# Patient Record
Sex: Female | Born: 1960 | Race: Black or African American | Hispanic: No | Marital: Single | Smoking: Never smoker
Health system: Southern US, Community
[De-identification: ages and names within clinical notes are randomized; demographics above are authoritative.]

## PROBLEM LIST (undated history)

## (undated) DIAGNOSIS — Z860100 Personal history of colon polyps, unspecified: Secondary | ICD-10-CM

## (undated) DIAGNOSIS — R030 Elevated blood-pressure reading, without diagnosis of hypertension: Secondary | ICD-10-CM

## (undated) DIAGNOSIS — T7840XA Allergy, unspecified, initial encounter: Secondary | ICD-10-CM

## (undated) DIAGNOSIS — R87629 Unspecified abnormal cytological findings in specimens from vagina: Secondary | ICD-10-CM

## (undated) HISTORY — PX: OTHER SURGICAL HISTORY: SHX169

## (undated) HISTORY — DX: Personal history of colon polyps, unspecified: Z86.0100

## (undated) HISTORY — DX: Allergy, unspecified, initial encounter: T78.40XA

## (undated) HISTORY — PX: BREAST BIOPSY: SHX20

## (undated) HISTORY — PX: BUNIONECTOMY: SHX129

## (undated) HISTORY — DX: Elevated blood-pressure reading, without diagnosis of hypertension: R03.0

## (undated) HISTORY — DX: Unspecified abnormal cytological findings in specimens from vagina: R87.629

## (undated) HISTORY — DX: Personal history of colonic polyps: Z86.010

---

## 1999-09-01 ENCOUNTER — Emergency Department (HOSPITAL_COMMUNITY): Admission: EM | Admit: 1999-09-01 | Discharge: 1999-09-01 | Payer: Self-pay | Admitting: Emergency Medicine

## 2002-05-20 ENCOUNTER — Other Ambulatory Visit: Admission: RE | Admit: 2002-05-20 | Discharge: 2002-05-20 | Payer: Self-pay | Admitting: Family Medicine

## 2002-06-02 ENCOUNTER — Encounter: Payer: Self-pay | Admitting: Family Medicine

## 2002-06-02 ENCOUNTER — Encounter: Admission: RE | Admit: 2002-06-02 | Discharge: 2002-06-02 | Payer: Self-pay | Admitting: Family Medicine

## 2005-01-25 ENCOUNTER — Other Ambulatory Visit: Admission: RE | Admit: 2005-01-25 | Discharge: 2005-01-25 | Payer: Self-pay | Admitting: Obstetrics and Gynecology

## 2007-06-04 ENCOUNTER — Encounter: Admission: RE | Admit: 2007-06-04 | Discharge: 2007-06-04 | Payer: Self-pay | Admitting: Obstetrics and Gynecology

## 2009-10-09 HISTORY — PX: COLONOSCOPY: SHX174

## 2009-11-17 ENCOUNTER — Encounter: Payer: Self-pay | Admitting: Gastroenterology

## 2009-11-18 ENCOUNTER — Encounter (INDEPENDENT_AMBULATORY_CARE_PROVIDER_SITE_OTHER): Payer: Self-pay | Admitting: *Deleted

## 2009-12-14 ENCOUNTER — Ambulatory Visit: Payer: Self-pay | Admitting: Gastroenterology

## 2009-12-14 DIAGNOSIS — K625 Hemorrhage of anus and rectum: Secondary | ICD-10-CM | POA: Insufficient documentation

## 2009-12-20 ENCOUNTER — Ambulatory Visit: Payer: Self-pay | Admitting: Gastroenterology

## 2010-01-10 ENCOUNTER — Encounter: Admission: RE | Admit: 2010-01-10 | Discharge: 2010-01-10 | Payer: Self-pay | Admitting: Obstetrics and Gynecology

## 2010-11-08 NOTE — Letter (Signed)
Summary: New Patient letter  [facility]  [address] [place], [place] [address]   Phone: [phone]  Fax: [phone]       11/18/2009 MRN: [id number]  Joanne Kim [address] [place], [place]  [address]  Dear Joanne Kim,  Welcome to the Gastroenterology Division at [facility].    You are scheduled to see Dr.  Melvia Heaps on December 14, 2009 at 11:15am  on the 3rd floor at [facility], [address]  We ask that you try to arrive at our office 15 minutes prior to your appointment time to allow for check-in.  We would like you to complete the enclosed self-administered evaluation form prior to your visit and bring it with you on the day of your appointment.  We will review it with you.  Also, please bring a complete list of all your medications or, if you prefer, bring the medication bottles and we will list them.  Please bring your insurance card so that we may make a copy of it.  If your insurance requires a referral to see a specialist, please bring your referral form from your primary care physician.  Co-payments are due at the time of your visit and may be paid by cash, check or credit card.     Your office visit will consist of a consult with your physician (includes a physical exam), any laboratory testing he/she may order, scheduling of any necessary diagnostic testing (e.g. x-ray, ultrasound, CT-scan), and scheduling of a procedure (e.g. Endoscopy, Colonoscopy) if required.  Please allow enough time on your schedule to allow for any/all of these possibilities.    If you cannot keep your appointment, please call [phone] to cancel or reschedule prior to your appointment date.  This allows [place] the opportunity to schedule an appointment for another patient in need of care.  If you do not cancel or reschedule by 5 p.m. the business day prior to your appointment date, you will be charged a $50.00 late cancellation/no-show fee.    Thank you  for choosing [facility] for your medical needs.  We appreciate the opportunity to care for you.  Please visit [place] at our website  to learn more about our practice.                     Sincerely,                                                             The Gastroenterology Division

## 2010-11-08 NOTE — Letter (Signed)
Summary: Results Letter  [facility]  [address] [place], [place] [address]   Phone: [phone]  Fax: [phone]        December 14, 2009 MRN: [id number]    Joanne Kim [address] [place], [place]  [address]    Dear Ms. HELLMANN,  It is my pleasure to have treated you recently as a new patient in my office. I appreciate your confidence and the opportunity to participate in your care.  Since I do have a busy inpatient endoscopy schedule and office schedule, my office hours vary weekly. I am, however, available for emergency calls everyday through my office. If I am not available for an urgent office appointment, another one of our gastroenterologist will be able to assist you.  My well-trained staff are prepared to help you at all times. For emergencies after office hours, a physician from our Gastroenterology section is always available through my 24 hour answering service  Once again I welcome you as a new patient and I look forward to a happy and healthy relationship             Sincerely,  Louis Meckel MD  This letter has been electronically signed by your physician.  Appended Document: Results Letter letter mailed

## 2010-11-08 NOTE — Assessment & Plan Note (Signed)
Summary: rectal bleeding, hemorroids, + hem stool.Marland Kitchenem   History of Present Illness Visit Type: Initial Consult Primary GI MD: Melvia Heaps MD [facility] Primary Provider: Rudi Heap, MD Requesting Provider: Aleatha Borer, MD Chief Complaint: Patient referred for rectal bleeding, hemo positive stools, and hemorrhoids. Patient complains of some belching and bloating she also complains of some constipation.   History of Present Illness:   Joanne Kim is a pleasant 50 year old Afro-American female referred at the request of Dr. Aleatha Borer for evaluation of rectal bleeding.  On multiple occasions she has noted bright red blood per rectum consisting of bright red blood that sometimes would precede a bowel movement.  She denies rectal or abdominal pain.  She has noted protrusion of hemorrhoids with a bowel movement.  She suffers from mild chronic constipation.   GI Review of Systems    Reports belching and  bloating.      Denies abdominal pain, acid reflux, chest pain, dysphagia with liquids, dysphagia with solids, heartburn, loss of appetite, nausea, vomiting, vomiting blood, weight loss, and  weight gain.      Reports constipation, heme positive stool, hemorrhoids, and  rectal bleeding.     Denies anal fissure, black tarry stools, change in bowel habit, diarrhea, diverticulosis, fecal incontinence, irritable bowel syndrome, jaundice, light color stool, liver problems, and  rectal pain. Preventive Screening-Counseling & Management  Alcohol-Tobacco     Smoking Status: quit      Drug Use:  no.      Current Medications (verified): 1)  Fish Oil 1000 Mg Caps (Omega-3 Fatty Acids) .... Take One By Mouth Once Daily 2)  Vitamin D3 1000 Unit Caps (Cholecalciferol) .... 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  Alcohol Use - no Daily Caffeine Use 2 per day Illicit Drug Use - no Smoking Status:  quit Drug Use:  no  Review of Systems       The patient complains of allergy/sinus, back pain, fatigue, shortness of breath, and swelling of feet/legs.  The patient denies anemia, anxiety-new, arthritis/joint pain, blood in urine, breast changes/lumps, change in vision, confusion, cough, coughing up blood, depression-new, fainting, fever, headaches-new, hearing problems, heart murmur, heart rhythm changes, itching, menstrual pain, muscle pains/cramps, night sweats, nosebleeds, pregnancy symptoms, skin rash, sleeping problems, sore throat, swollen lymph glands, thirst - excessive , urination - excessive , urination changes/pain, urine leakage, vision changes, and voice change.         All other systems were reviewed and were negative   Vital Signs:  Patient profile:   50 year old female Height:      69 inches Weight:      237.6 pounds BMI:     35.21 Pulse rate:   74 / minute Pulse rhythm:   regular BP sitting:   118 / 76  (left arm) Cuff size:   regular  Vitals Entered By: Joanne Kim CMA Duncan Dull) (December 14, 2009 11:08 AM)  Physical Exam  Additional Exam:  She is an obese female  skin: anicteric HEENT: normocephalic; PEERLA; no nasal or pharyngeal abnormalities neck: supple nodes: no cervical lymphadenopathy chest: clear to ausculatation and percussion heart: no murmurs, gallops, or rubs abd: soft, nontender; BS normoactive; no  abdominal masses, tenderness, organomegaly rectal: no masses ext: no cynanosis, clubbing, edema skeletal: no deformities neuro: oriented x 3; no focal abnormalities    Impression & Recommendations:  Problem # 1:  RECTAL BLEEDING (ICD-569.3) Assessment New  Bleeding could be due to hemorrhoids.  A more proximal  colonic bleeding source should be ruled out.  Recommendations #1 colonoscopy #2 medical therapy for hemorrhoids if this is her bleeding source  Risks, alternatives, and complications of the procedure, including bleeding, perforation, and possible need for surgery, were explained to the patient.  Patient's questions were answered.  Orders: Colonoscopy (Colon)  Patient Instructions: 1)  Colonoscopy and Flexible Sigmoidoscopy brochure given.  2)  Conscious Sedation brochure given.  3)  Hemorrhoids brochure given.  4)  Your Colonoscopy is schedulded for 12/20/2009 at 8am 4 th floor of the [facility] 5)  You can pick up your MoviPrep from your pharmacy today 6)  CC Dr. Aleatha Borer 7)  CC Dr. Vernon Prey 8)  The medication list was reviewed and reconciled.  All changed / newly prescribed medications were explained.  A complete medication list was provided to the patient / caregiver. Prescriptions: MOVIPREP 100 GM  SOLR (PEG-KCL-NACL-NASULF-NA ASC-C) As per prep instructions.  #1 x 0   Entered by:   Joanne Kim CMA (AAMA)   Authorized by:   Louis Meckel MD   Signed by:   Joanne Kim CMA (AAMA) on 12/14/2009   Method used:   Electronically to        [facility] # [phone]* (retail)       [address] [place], [place]  [address]       Ph: [phone]       Fax: [phone]   RxID:   [id number]

## 2010-11-08 NOTE — Letter (Signed)
Summary: [facility] Instructions  [facility]  [address] [place], [place] [address]   Phone: [phone]  Fax: [phone]       Joanne Kim    [birth year]    MRN: [id number]        Procedure Day /Date:MONDAY 12/20/2009     Arrival Time:7:30AM     Procedure Time:8:00AM     Location of Procedure:                    X   [facility] (4th Floor)                        PREPARATION FOR COLONOSCOPY WITH MOVIPREP   Starting 5 days prior to your procedure 12/15/2009 do not eat nuts, seeds, popcorn, corn, beans, peas,  salads, or any raw vegetables.  Do not take any fiber supplements (e.g. Metamucil, Citrucel, and Benefiber).  THE DAY BEFORE YOUR PROCEDURE         DATE:12/19/2009  DAY: SUNDAY  1.  Drink clear liquids the entire day-NO SOLID FOOD  2.  Do not drink anything colored red or purple.  Avoid juices with pulp.  No orange juice.  3.  Drink at least 64 oz. (8 glasses) of fluid/clear liquids during the day to prevent dehydration and help the prep work efficiently.  CLEAR LIQUIDS INCLUDE: Water Jello Ice Popsicles Tea (sugar ok, no milk/cream) Powdered fruit flavored drinks Coffee (sugar ok, no milk/cream) Gatorade Juice: apple, white grape, white cranberry  Lemonade Clear bullion, consomm, broth Carbonated beverages (any kind) Strained chicken noodle soup Hard Candy                             4.  In the morning, mix first dose of MoviPrep solution:    Empty 1 Pouch A and 1 Pouch B into the disposable container    Add lukewarm drinking water to the top line of the container. Mix to dissolve    Refrigerate (mixed solution should be used within 24 hrs)  5.  Begin drinking the prep at 5:00 p.m. The MoviPrep container is divided by 4 marks.   Every 15 minutes drink the solution down to the next mark (approximately 8 oz) until the full liter is complete.   6.  Follow completed prep with 16 oz of clear liquid of your choice (Nothing  red or purple).  Continue to drink clear liquids until bedtime.  7.  Before going to bed, mix second dose of MoviPrep solution:    Empty 1 Pouch A and 1 Pouch B into the disposable container    Add lukewarm drinking water to the top line of the container. Mix to dissolve    Refrigerate  THE DAY OF YOUR PROCEDURE      DATE: 12/20/2009 DAY: MONDAY  Beginning at 3a.m. (5 hours before procedure):         1. Every 15 minutes, drink the solution down to the next mark (approx 8 oz) until the full liter is complete.  2. Follow completed prep with 16 oz. of clear liquid of your choice.    3. You may drink clear liquids until 6AM(2 HOURS BEFORE PROCEDURE).   MEDICATION INSTRUCTIONS  Unless otherwise instructed, you should take regular prescription medications with a small sip of water   as early as possible the morning of your procedure.  OTHER INSTRUCTIONS  You will need a responsible adult at least 48 years of age to accompany you and drive you home.   This person must remain in the waiting room during your procedure.  Wear loose fitting clothing that is easily removed.  Leave jewelry and other valuables at home.  However, you may wish to bring a book to read or  an iPod/MP3 player to listen to music as you wait for your procedure to start.  Remove all body piercing jewelry and leave at home.  Total time from sign-in until discharge is approximately 2-3 hours.  You should go home directly after your procedure and rest.  You can resume normal activities the  day after your procedure.  The day of your procedure you should not:   Drive   Make legal decisions   Operate machinery   Drink alcohol   Return to work  You will receive specific instructions about eating, activities and medications before you leave.    The above instructions have been reviewed and explained to me by   _______________________    I fully understand and can verbalize these  instructions _____________________________ Date _________

## 2010-11-08 NOTE — Miscellaneous (Signed)
  Clinical Lists Changes  Medications: Added new medication of ANUSOL-HC 25 MG SUPP (HYDROCORTISONE ACETATE) 1 supp qhs - Signed Rx of ANUSOL-HC 25 MG SUPP (HYDROCORTISONE ACETATE) 1 supp qhs;  #7 x 1;  Signed;  Entered by: Louis Meckel MD;  Authorized by: Louis Meckel MD;  Method used: [facility] to [facility] # [phone]*, [address] [place], [place], [place]  [address], Ph: [phone], Fax: [phone]    Prescriptions: ANUSOL-HC 25 MG SUPP (HYDROCORTISONE ACETATE) 1 supp qhs  #7 x 1   Entered and Authorized by:   Louis Meckel MD   Signed by:   Louis Meckel MD on 12/20/2009   Method used:   Electronically to        [facility] # [phone]* (retail)       [address] [place], [place]  [address]       Ph: [phone]       Fax: [phone]   RxID:   [id number]

## 2010-11-08 NOTE — Procedures (Signed)
Summary: Colonoscopy  Patient: Joanne Kim Note: All result statuses are Final unless otherwise noted.  Tests: (1) Colonoscopy (COL)   COL Colonoscopy           DONE     [facility]     [address]     [place], [place]  [address]           COLONOSCOPY PROCEDURE REPORT           PATIENT:  Joanne Kim, Joanne Kim  MR#:  [id number]     BIRTHDATE:  January 03, 1961, 48 yrs. old  GENDER:  female           ENDOSCOPIST:  Barbette Hair. Arlyce Dice, Joanne Kim     Referred by:           PROCEDURE DATE:  12/20/2009     PROCEDURE:  Colonoscopy, Diagnostic     ASA CLASS:  Class I     INDICATIONS:  rectal bleeding           MEDICATIONS:   Fentanyl 75 mcg IV, Versed 6 mg IV           DESCRIPTION OF PROCEDURE:   After the risks benefits and     alternatives of the procedure were thoroughly explained, informed     consent was obtained.  Digital rectal exam was performed and     revealed no abnormalities.   The LB [id number] endoscope     was introduced through the anus and advanced to the cecum, which     was identified by the ileocecal valve, without limitations.  The     quality of the prep was adequate, using Nulytley.  The instrument     was then slowly withdrawn as the colon was fully examined.     <<PROCEDUREIMAGES>>           FINDINGS:  A normal appearing cecum, ileocecal valve, and     appendiceal orifice were identified. The ascending, hepatic     flexure, transverse, splenic flexure, descending, sigmoid colon,     and rectum appeared unremarkable (see image1, image2, image3,     image4, image5, image6, image9, image10, image11, and image12).     Retroflexed views in the rectum revealed no abnormalities.    The     scope was then withdrawn from the patient and the procedure     completed.           COMPLICATIONS:  None           ENDOSCOPIC IMPRESSION:     1) Normal colon           Limited rectal bleeding likely secondary to hemorrhoids           RECOMMENDATIONS:     1)  Anusol HC supp. prn           REPEAT EXAM:  In 10 year(s) for Colonoscopy.           ______________________________     Barbette Hair. Arlyce Dice, Joanne Kim           CC:  Joanne Kim, Joanne Kim, Joanne Kim           n.     Rosalie Doctor:   Barbette Hair. Kaplan at 12/20/2009 09:02 AM           Page 2 of 3   Shame[place], [place], [phone]  Note: An exclamation mark (!) indicates a result that was not dispersed into the flowsheet. Document Creation Date: 12/20/2009 9:02 AM _______________________________________________________________________  Marland Kitchen  1) Order result status: Final Collection or observation date-time: 12/20/2009 08:53 Requested date-time:  Receipt date-time:  Reported date-time:  Referring Physician:   Ordering Physician: Melvia Heaps [phone]) Specimen Source:  Source: Launa Grill Order Number: [id number] Lab site:   Appended Document: Colonoscopy    Clinical Lists Changes  Observations: Added new observation of COLONNXTDUE: 12/2019 (12/20/2009 16:07)

## 2012-01-22 ENCOUNTER — Other Ambulatory Visit: Payer: Self-pay | Admitting: Obstetrics and Gynecology

## 2012-01-22 DIAGNOSIS — Z1231 Encounter for screening mammogram for malignant neoplasm of breast: Secondary | ICD-10-CM

## 2012-02-15 ENCOUNTER — Ambulatory Visit: Payer: Self-pay

## 2013-09-17 ENCOUNTER — Other Ambulatory Visit (HOSPITAL_COMMUNITY): Payer: Self-pay | Admitting: Nurse Practitioner

## 2013-09-17 DIAGNOSIS — Z139 Encounter for screening, unspecified: Secondary | ICD-10-CM

## 2013-09-23 ENCOUNTER — Ambulatory Visit (HOSPITAL_COMMUNITY): Payer: Self-pay

## 2013-09-29 ENCOUNTER — Ambulatory Visit (HOSPITAL_COMMUNITY)
Admission: RE | Admit: 2013-09-29 | Discharge: 2013-09-29 | Disposition: A | Discharge status: Home / Self Care | Payer: Self-pay | Source: Ambulatory Visit | Attending: Nurse Practitioner | Admitting: Nurse Practitioner

## 2013-09-29 DIAGNOSIS — Z1231 Encounter for screening mammogram for malignant neoplasm of breast: Secondary | ICD-10-CM | POA: Insufficient documentation

## 2013-09-29 DIAGNOSIS — Z139 Encounter for screening, unspecified: Secondary | ICD-10-CM

## 2014-07-16 ENCOUNTER — Encounter: Payer: Self-pay | Admitting: Gastroenterology

## 2015-10-10 DIAGNOSIS — R87629 Unspecified abnormal cytological findings in specimens from vagina: Secondary | ICD-10-CM

## 2015-10-10 HISTORY — DX: Unspecified abnormal cytological findings in specimens from vagina: R87.629

## 2015-11-06 ENCOUNTER — Emergency Department (HOSPITAL_COMMUNITY)
Admission: EM | Admit: 2015-11-06 | Discharge: 2015-11-06 | Disposition: A | Discharge status: Home / Self Care | Payer: Managed Care, Other (non HMO) | Attending: Emergency Medicine | Admitting: Emergency Medicine

## 2015-11-06 ENCOUNTER — Encounter (HOSPITAL_COMMUNITY): Payer: Self-pay | Admitting: *Deleted

## 2015-11-06 ENCOUNTER — Emergency Department (HOSPITAL_COMMUNITY): Payer: Managed Care, Other (non HMO)

## 2015-11-06 DIAGNOSIS — R0602 Shortness of breath: Secondary | ICD-10-CM | POA: Insufficient documentation

## 2015-11-06 DIAGNOSIS — R079 Chest pain, unspecified: Secondary | ICD-10-CM | POA: Diagnosis present

## 2015-11-06 DIAGNOSIS — F419 Anxiety disorder, unspecified: Secondary | ICD-10-CM | POA: Diagnosis not present

## 2015-11-06 DIAGNOSIS — R0789 Other chest pain: Secondary | ICD-10-CM | POA: Diagnosis not present

## 2015-11-06 LAB — CBC WITH DIFFERENTIAL/PLATELET
Basophils Absolute: 0 10*3/uL (ref 0.0–0.1)
Basophils Relative: 0 %
EOS ABS: 0.1 10*3/uL (ref 0.0–0.7)
Eosinophils Relative: 1 %
HEMATOCRIT: 46.9 % — AB (ref 36.0–46.0)
HEMOGLOBIN: 15.3 g/dL — AB (ref 12.0–15.0)
LYMPHS ABS: 2.3 10*3/uL (ref 0.7–4.0)
Lymphocytes Relative: 35 %
MCH: 30 pg (ref 26.0–34.0)
MCHC: 32.6 g/dL (ref 30.0–36.0)
MCV: 92 fL (ref 78.0–100.0)
MONOS PCT: 4 %
Monocytes Absolute: 0.3 10*3/uL (ref 0.1–1.0)
NEUTROS PCT: 60 %
Neutro Abs: 3.8 10*3/uL (ref 1.7–7.7)
Platelets: 168 10*3/uL (ref 150–400)
RBC: 5.1 MIL/uL (ref 3.87–5.11)
RDW: 14.3 % (ref 11.5–15.5)
WBC: 6.5 10*3/uL (ref 4.0–10.5)

## 2015-11-06 LAB — COMPREHENSIVE METABOLIC PANEL
ALK PHOS: 82 U/L (ref 38–126)
ALT: 14 U/L (ref 14–54)
ANION GAP: 12 (ref 5–15)
AST: 25 U/L (ref 15–41)
Albumin: 4.1 g/dL (ref 3.5–5.0)
BILIRUBIN TOTAL: 0.8 mg/dL (ref 0.3–1.2)
BUN: 9 mg/dL (ref 6–20)
CALCIUM: 9.5 mg/dL (ref 8.9–10.3)
CO2: 20 mmol/L — ABNORMAL LOW (ref 22–32)
Chloride: 112 mmol/L — ABNORMAL HIGH (ref 101–111)
Creatinine, Ser: 0.84 mg/dL (ref 0.44–1.00)
GFR calc non Af Amer: >60 mL/min (ref >60)
Glucose, Bld: 105 mg/dL — ABNORMAL HIGH (ref 65–99)
Potassium: 3.5 mmol/L (ref 3.5–5.1)
SODIUM: 144 mmol/L (ref 135–145)
TOTAL PROTEIN: 8 g/dL (ref 6.5–8.1)

## 2015-11-06 LAB — I-STAT TROPONIN, ED: Troponin i, poc: 0 ng/mL (ref 0.00–0.08)

## 2015-11-06 LAB — D-DIMER, QUANTITATIVE (NOT AT ARMC)

## 2015-11-06 MED ORDER — ALBUTEROL SULFATE (2.5 MG/3ML) 0.083% IN NEBU: 5.0000 mg | INHALATION_SOLUTION | Freq: Once | RESPIRATORY_TRACT | Status: DC

## 2015-11-06 NOTE — ED Provider Notes (Signed)
CSN: [id number]     Arrival date & time 11/06/15  1617 History   First MD Initiated Contact with Patient 11/06/15 1638     Chief Complaint  Patient presents with  . Chest Pain  . Shortness of Breath     (Consider location/radiation/quality/duration/timing/severity/associated sxs/prior Treatment) The history is provided by the patient.  Joanne Kim is a 55 y.o. female here presenting with anxiety, shortness of breath, chest tightness. Patient has been having some chest tightness and occasional left arm tingling for the last several weeks. She has some shortness of breath after she exerts herself. 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 Went to urgent care several weeks ago and had a EKG that was borderline abnormal but she refused to come to the ER for evaluation. Today she laid down and had some shortness of breath. Denies any ankle swelling but did have some several weeks ago. Has been very stressed out at work and has some subjective chills. She drives 2 hours each day to go to work in [place].    History reviewed. No pertinent past medical history. Past Surgical History  Procedure Laterality Date  . Bunyonectomy     No family history on file. Social History  Substance Use Topics  . Smoking status: Never Smoker   . Smokeless tobacco: None  . Alcohol Use: Yes     Comment: occasionally   OB History    No data available     Review of Systems  Respiratory: Positive for shortness of breath.   Cardiovascular: Positive for chest pain.  All other systems reviewed and are negative.     Allergies  Oxycodone  Home Medications   Prior to Admission medications   Medication Sig Start Date End Date Taking? Authorizing Provider  naproxen sodium (ANAPROX) 220 MG tablet Take 440 mg by mouth daily as needed (headache).   Yes Historical Provider, MD   BP 142/87 mmHg  Pulse 72  Temp(Src) 99.1 F (37.3 C) (Oral)   Resp 16  SpO2 100%  LMP 10/10/2015 Physical Exam  Constitutional: She is oriented to person, place, and time. She appears well-developed and well-nourished.  Anxious   HENT:  Head: Normocephalic.  Mouth/Throat: Oropharynx is clear and moist.  Eyes: Conjunctivae are normal. Pupils are equal, round, and reactive to light.  Neck: Normal range of motion. Neck supple.  Cardiovascular: Normal rate, regular rhythm and normal heart sounds.   Pulmonary/Chest: Effort normal and breath sounds normal. No respiratory distress. She has no wheezes. She has no rales.  Mild reproducible L chest tenderness   Abdominal: Soft. Bowel sounds are normal. She exhibits no distension. There is no tenderness. There is no rebound.  Musculoskeletal: Normal range of motion. She exhibits no edema or tenderness.  Neurological: She is alert and oriented to person, place, and time. No cranial nerve deficit. Coordination normal.  Skin: Skin is warm and dry.  Psychiatric: She has a normal mood and affect. Her behavior is normal. Judgment and thought content normal.  Nursing note and vitals reviewed.   ED Course  Procedures (including critical care time) Labs Review Labs Reviewed  CBC WITH DIFFERENTIAL/PLATELET - Abnormal; Notable for the following:    Hemoglobin 15.3 (*)    HCT 46.9 (*)    All other components within normal limits  COMPREHENSIVE METABOLIC PANEL - Abnormal; Notable for the following:    Chloride 112 (*)    CO2 20 (*)  Glucose, Bld 105 (*)    All other components within normal limits  D-DIMER, QUANTITATIVE (NOT AT [facility])  Randolm Idol, ED    Imaging Review Dg Chest 2 View  11/06/2015  CLINICAL DATA:  Patient with shortness of breath and chest tightness. Left arm tingling and numbness intermittent for 2 weeks. EXAM: CHEST  2 VIEW COMPARISON:  None. FINDINGS: Normal cardiac and mediastinal contours. No consolidative pulmonary opacities. No pleural effusion or pneumothorax. Regional skeleton is  unremarkable. IMPRESSION: No active cardiopulmonary disease. Electronically Signed   By: Lovey Newcomer M.D.   On: 11/06/2015 18:03   I have personally reviewed and evaluated these images and lab results as part of my medical decision-making.   EKG Interpretation   Date/Time:  Saturday November 06 2015 16:59:15 EST Ventricular Rate:  89 PR Interval:  146 QRS Duration: 88 QT Interval:  372 QTC Calculation: 453 R Axis:   39 Text Interpretation:  Sinus rhythm No significant change since last  tracing Confirmed by Shaunta Oncale  MD, Dede Dobesh ([id number]) on 11/06/2015 5:05:52 PM      MDM   Final diagnoses:  None    Joanne Kim is a 55 y.o. female here with chest pain, shortness of breath. Likely anxiety but consider PE vs ACS. Will get d-dimer. Symptoms for weeks so trop x 1 sufficient. Will get CXR and reassess.   9:23 PM CXR nl. D-dimer neg. Pain free. Will dc home.     Wandra Arthurs, MD 11/06/15 2123

## 2015-11-06 NOTE — Discharge Instructions (Signed)
Take tylenol or motrin for pain.   See your doctor.   Return to ER if you have worse chest pain, shortness of breath, anxiety.

## 2015-11-06 NOTE — ED Notes (Signed)
Pt reports she's been having anxious feeling lately with SOB, chest tightness and L arm tingling.  Went to UC for same.  Pt also reports SOB is worse when laying flat and have noticed mild swelling around her ankles.

## 2015-11-15 ENCOUNTER — Other Ambulatory Visit: Payer: Self-pay | Admitting: Nurse Practitioner

## 2015-11-15 DIAGNOSIS — N6310 Unspecified lump in the right breast, unspecified quadrant: Secondary | ICD-10-CM

## 2015-11-22 ENCOUNTER — Ambulatory Visit
Admission: RE | Admit: 2015-11-22 | Discharge: 2015-11-22 | Disposition: A | Discharge status: Home / Self Care | Payer: Managed Care, Other (non HMO) | Source: Ambulatory Visit | Attending: Nurse Practitioner | Admitting: Nurse Practitioner

## 2015-11-22 DIAGNOSIS — N6310 Unspecified lump in the right breast, unspecified quadrant: Secondary | ICD-10-CM

## 2015-12-14 ENCOUNTER — Telehealth: Payer: Self-pay | Admitting: Behavioral Health

## 2015-12-14 NOTE — Telephone Encounter (Signed)
Unable to reach patient at time of Pre-Visit Call.  Left message for patient to return call when available.    

## 2015-12-15 ENCOUNTER — Ambulatory Visit: Payer: Managed Care, Other (non HMO) | Admitting: Family Medicine

## 2015-12-15 ENCOUNTER — Telehealth: Payer: Self-pay | Admitting: Family Medicine

## 2015-12-16 NOTE — Telephone Encounter (Signed)
Pt was no show for new pt appt 12/15/15 9:30am, pt has not rescheduled, charge or no charge? Allow reschedule?

## 2015-12-20 NOTE — Telephone Encounter (Signed)
-----   Message from [name], MD sent at 12/16/2015  6:37 PM EST ----- Charge for no show

## 2015-12-20 NOTE — Telephone Encounter (Signed)
Marked to charge and mailing no show letter °

## 2016-01-31 ENCOUNTER — Ambulatory Visit: Payer: Managed Care, Other (non HMO) | Admitting: Family Medicine

## 2016-02-16 ENCOUNTER — Ambulatory Visit: Payer: Managed Care, Other (non HMO) | Admitting: Family Medicine

## 2016-04-24 ENCOUNTER — Emergency Department (HOSPITAL_COMMUNITY)
Admission: EM | Admit: 2016-04-24 | Discharge: 2016-04-24 | Disposition: A | Discharge status: Home / Self Care | Payer: BLUE CROSS/BLUE SHIELD | Attending: Emergency Medicine | Admitting: Emergency Medicine

## 2016-04-24 ENCOUNTER — Emergency Department (HOSPITAL_COMMUNITY): Payer: BLUE CROSS/BLUE SHIELD

## 2016-04-24 ENCOUNTER — Encounter (HOSPITAL_COMMUNITY): Payer: Self-pay | Admitting: Emergency Medicine

## 2016-04-24 DIAGNOSIS — S8002XA Contusion of left knee, initial encounter: Secondary | ICD-10-CM | POA: Diagnosis not present

## 2016-04-24 DIAGNOSIS — S99912A Unspecified injury of left ankle, initial encounter: Secondary | ICD-10-CM | POA: Diagnosis present

## 2016-04-24 DIAGNOSIS — Y929 Unspecified place or not applicable: Secondary | ICD-10-CM | POA: Diagnosis not present

## 2016-04-24 DIAGNOSIS — S93402A Sprain of unspecified ligament of left ankle, initial encounter: Secondary | ICD-10-CM

## 2016-04-24 DIAGNOSIS — Y999 Unspecified external cause status: Secondary | ICD-10-CM | POA: Diagnosis not present

## 2016-04-24 DIAGNOSIS — Y939 Activity, unspecified: Secondary | ICD-10-CM | POA: Diagnosis not present

## 2016-04-24 DIAGNOSIS — W228XXA Striking against or struck by other objects, initial encounter: Secondary | ICD-10-CM | POA: Insufficient documentation

## 2016-04-24 MED ORDER — NAPROXEN 500 MG PO TABS: 500.0000 mg | ORAL_TABLET | Freq: Two times a day (BID) | ORAL | Status: DC | PRN

## 2016-04-24 MED ORDER — HYDROCODONE-ACETAMINOPHEN 5-325 MG PO TABS: 1.0000 | ORAL_TABLET | Freq: Four times a day (QID) | ORAL | Status: DC | PRN

## 2016-04-24 MED ORDER — FENTANYL CITRATE (PF) 100 MCG/2ML IJ SOLN
INTRAMUSCULAR | Status: AC
  Filled 2016-04-24: qty 2

## 2016-04-24 MED ORDER — FENTANYL CITRATE (PF) 100 MCG/2ML IJ SOLN
50.0000 ug | INTRAMUSCULAR | Status: DC | PRN
  Administered 2016-04-24: 50 ug via NASAL

## 2016-04-24 NOTE — ED Provider Notes (Signed)
CSN: [id number]     Arrival date & time 04/24/16  1534 History  By signing my name below, I, Eustaquio Maize, attest that this documentation has been prepared under the direction and in the presence of [address], [employer]. Electronically Signed: Eustaquio Maize, ED Scribe. 04/24/2016. 6:21 PM.   Chief Complaint  Patient presents with  . Ankle Pain  . Knee Pain    Patient is a 55 y.o. female presenting with ankle pain and knee pain. The history is provided by the patient. No language interpreter was used.  Ankle Pain Location:  Ankle and knee Time since incident:  8 hours Injury: yes   Mechanism of injury: fall   Knee location:  L knee Ankle location:  L ankle Pain details:    Quality:  Throbbing   Radiates to:  L leg   Severity:  Severe   Onset quality:  Gradual   Duration:  8 hours   Timing:  Constant   Progression:  Improving Chronicity:  New Dislocation: no   Relieved by: Fentanyl. Worsened by:  Bearing weight Ineffective treatments:  Acetaminophen Associated symptoms: decreased ROM (due to pain) and swelling   Associated symptoms: no back pain, no muscle weakness, no neck pain, no numbness and no tingling   Knee Pain Associated symptoms: decreased ROM (due to pain) and swelling   Associated symptoms: no back pain, no muscle weakness, no neck pain, no numbness and no tingling     HPI Comments: Joanne Kim is a 55 y.o. female who presents to the Emergency Department complaining of gradual onset, constant, 10/10, throbbing, left ankle pain radiating up calf to left knee s/p fall that occurred at 10:15 AM this morning. Pt reports that she was wearing heels and tripped while stepping down off a curb onto uneven pavement, causing her to fall and land onto her left knee. Pt is unsure what happened to her ankle in the process. No head injury or LOC. Pt was able to ambulate after falling and went about her day until she began having gradual pain, prompting her to come to the  ED. She states that she is now unable to bear weight onto the ankle. She also complains of swelling to the areas. Pain worsened with ambulation. Pt took Tylenol without relief. She was given Fentanyl and an ice pack in triage with relief. Denies chest pain, shortness of breath, abdominal pain, nausea, vomiting, neck pain, back pain, diarrhea, constipation, abrasions, bruising, numbness, tingling, weakness, or any other associated symptoms.   History reviewed. No pertinent past medical history. Past Surgical History  Procedure Laterality Date  . Bunyonectomy     No family history on file. Social History  Substance Use Topics  . Smoking status: Never Smoker   . Smokeless tobacco: None  . Alcohol Use: Yes     Comment: occasionally   OB History    No data available     Review of Systems  HENT: Negative for facial swelling (no head injury).   Respiratory: Negative for shortness of breath.   Cardiovascular: Negative for chest pain.  Gastrointestinal: Negative for nausea, vomiting, abdominal pain, diarrhea and constipation.  Genitourinary: Negative for dysuria and hematuria.  Musculoskeletal: Positive for joint swelling and arthralgias. Negative for back pain and neck pain.  Skin: Negative for color change and wound.  Allergic/Immunologic: Negative for immunocompromised state.  Neurological: Negative for syncope, weakness and numbness.  Psychiatric/Behavioral: Negative for confusion.  A complete 10 system review of systems was obtained and all  systems are negative except as noted in the HPI and PMH.   Allergies  Oxycodone  Home Medications   Prior to Admission medications   Medication Sig Start Date End Date Taking? Authorizing Provider  naproxen sodium (ANAPROX) 220 MG tablet Take 440 mg by mouth daily as needed (headache).    Historical Provider, MD   BP 133/91 mmHg  Pulse 84  Temp(Src) 98.7 F (37.1 C) (Oral)  Resp 16  Ht 5\' 9"  (1.753 m)  Wt 210 lb (95.255 kg)  BMI 31.00  kg/m2  SpO2 95%  LMP 11/09/2015 (Approximate)   Physical Exam  Constitutional: She is oriented to person, place, and time. Vital signs are normal. She appears well-developed and well-nourished.  Non-toxic appearance. No distress.  Afebrile, nontoxic, NAD  HENT:  Head: Normocephalic and atraumatic.  Mouth/Throat: Mucous membranes are normal.  Eyes: Conjunctivae and EOM are normal. Right eye exhibits no discharge. Left eye exhibits no discharge.  Neck: Normal range of motion. Neck supple.  Cardiovascular: Normal rate and intact distal pulses.   Pulmonary/Chest: Effort normal. No respiratory distress.  Abdominal: Normal appearance. She exhibits no distension.  Musculoskeletal:       Left knee: She exhibits decreased range of motion (due to pain) and swelling. She exhibits no ecchymosis, no deformity, no laceration, no erythema, normal alignment, no LCL laxity, normal patellar mobility and no MCL laxity. Tenderness found. Lateral joint line tenderness noted.       Left ankle: She exhibits decreased range of motion (due to pain) and swelling. She exhibits no ecchymosis, no deformity, no laceration and normal pulse. Tenderness. Lateral malleolus tenderness found. Achilles tendon normal.  Left ankle with limited ROM due to pain, +swelling without deformity, with moderate TTP of lateral malleolus but no TTP or swelling of fore foot or calf. No break in skin. No bruising or erythema. No warmth. Achilles intact. Left knee with lateral joint line TTP, some swelling but no bruising, no skin injury, with limited ROM due to pain. Good pedal pulse and cap refill of all toes. Wiggling toes without difficulty. Sensation grossly intact. Soft compartments  Neurological: She is alert and oriented to person, place, and time. She has normal strength. No sensory deficit.  Skin: Skin is warm, dry and intact. No rash noted.  Psychiatric: She has a normal mood and affect. Her behavior is normal.  Nursing note and vitals  reviewed.   ED Course  Procedures (including critical care time)  DIAGNOSTIC STUDIES: Oxygen Saturation is 95% on RA, adequate by my interpretation.    COORDINATION OF CARE: 6:12 PM-Discussed treatment plan which includes crutches and knee sleeve with pt at bedside and pt agreed to plan.   Labs Review Labs Reviewed - No data to display  Imaging Review Dg Ankle Complete Left  04/24/2016  CLINICAL DATA:  Left ankle pain/swelling, status post fall EXAM: LEFT ANKLE COMPLETE - 3+ VIEW COMPARISON:  None. FINDINGS: No fracture or dislocation is seen. The ankle mortise is intact. The base of the fifth metatarsal is unremarkable. Small plantar and posterior calcaneal enthesophytes. The visualized soft tissues are unremarkable. IMPRESSION: No fracture or dislocation is seen. Electronically Signed   By: Julian Hy M.D.   On: 04/24/2016 17:35   Dg Knee Complete 4 Views Left  04/24/2016  CLINICAL DATA:  Anterior left knee pain and swelling after tripping while walking down the [place] [address] this morning. Initial encounter. EXAM: LEFT KNEE - COMPLETE 4+ VIEW COMPARISON:  None. FINDINGS: No fracture, dislocation, or knee joint  effusion is identified. Joint space widths are preserved. No focal osseous lesion or soft tissue abnormality is seen. IMPRESSION: Negative. Electronically Signed   By: Logan Bores M.D.   On: 04/24/2016 17:35   I have personally reviewed and evaluated these images and lab results as part of my medical decision-making.   EKG Interpretation None      MDM   Final diagnoses:  Knee contusion, left, initial encounter  Left ankle sprain, initial encounter    55 y.o. female here with mechanical fall prior to arrival, L ankle and knee pain/swelling. NVI with soft compartments. Xrays obtained which were neg. Likely just sprain and contusion. ASO and knee sleeve applied, RICE discussed, crutches given, f/up with ortho in 1-2wks for recheck. Pain meds given. I explained the diagnosis  and have given explicit precautions to return to the ER including for any other new or worsening symptoms. The patient understands and accepts the medical plan as it's been dictated and I have answered their questions. Discharge instructions concerning home care and prescriptions have been given. The patient is STABLE and is discharged to home in good condition.   I personally performed the services described in this documentation, which was scribed in my presence. The recorded information has been reviewed and is accurate.  BP 133/91 mmHg  Pulse 84  Temp(Src) 98.7 F (37.1 C) (Oral)  Resp 16  Ht 5\' 9"  (1.753 m)  Wt 95.255 kg  BMI 31.00 kg/m2  SpO2 95%  LMP 11/09/2015 (Approximate)  Meds ordered this encounter  Medications  . fentaNYL (SUBLIMAZE) injection 50 mcg    Sig:   . fentaNYL (SUBLIMAZE) 100 MCG/2ML injection    Sig:     Eilene Ghazi   : cabinet override  . HYDROcodone-acetaminophen (NORCO) 5-325 MG tablet    Sig: Take 1 tablet by mouth every 6 (six) hours as needed for severe pain.    Dispense:  6 tablet    Refill:  0    Order Specific Question:  Supervising Provider    Answer:  MILLER, BRIAN [[id number]]  . naproxen (NAPROSYN) 500 MG tablet    Sig: Take 1 tablet (500 mg total) by mouth 2 (two) times daily as needed for mild pain, moderate pain or headache (TAKE WITH MEALS.).    Dispense:  20 tablet    Refill:  0    Order Specific Question:  Supervising Provider    Answer:  Noemi Chapel [[id number]]        Liela Rylee Camprubi-Soms, PA-C 04/24/16 1831  Forde Dandy, MD 04/25/16 0003

## 2016-04-24 NOTE — ED Notes (Signed)
Pt states around 1015 she was walking outside with heels on and stepped on uneven pavement and fall onto left knee and twisted ankle. Pt has swelling to left ankle and left knee. Pedal pulses +3 equal bilaterally.

## 2016-04-24 NOTE — Discharge Instructions (Signed)
Wear ankle brace for at least 2 weeks for stabilization of ankle. Use knee sleeve and crutches as needed for comfort. Ice and elevate ankle and knee throughout the day, using ice pack for no more than 20 minutes every hour.  Alternate between naprosyn and norco for pain relief. Do not drive or operate machinery with pain medication use. Call orthopedic follow up today or tomorrow to schedule followup appointment for recheck of ongoing ankle and knee pain in 1-2 weeks that can be canceled with a 24-48 hour notice if complete resolution of pain. Return to the ER for changes or worsening symptoms.    Ankle Sprain An ankle sprain is an injury to the strong, fibrous tissues (ligaments) that hold your ankle bones together.  HOME CARE   Put ice on your ankle for 1-2 days or as told by your doctor.  Put ice in a plastic bag.  Place a towel between your skin and the bag.  Leave the ice on for 15-20 minutes at a time, every 2 hours while you are awake.  Only take medicine as told by your doctor.  Raise (elevate) your injured ankle above the level of your heart as much as possible for 2-3 days.  Use crutches if your doctor tells you to. Slowly put your own weight on the affected ankle. Use the crutches until you can walk without pain.  If you have a plaster splint:  Do not rest it on anything harder than a pillow for 24 hours.  Do not put weight on it.  Do not get it wet.  Take it off to shower or bathe.  If given, use an elastic wrap or support stocking for support. Take the wrap off if your toes lose feeling (numb), tingle, or turn cold or blue.  If you have an air splint:  Add or let out air to make it comfortable.  Take it off at night and to shower and bathe.  Wiggle your toes and move your ankle up and down often while you are wearing it. GET HELP IF:  You have rapidly increasing bruising or puffiness (swelling).  Your toes feel very cold.  You lose feeling in your  foot.  Your medicine does not help your pain. GET HELP RIGHT AWAY IF:   Your toes lose feeling (numb) or turn blue.  You have severe pain that is increasing. MAKE SURE YOU:   Understand these instructions.  Will watch your condition.  Will get help right away if you are not doing well or get worse.   This information is not intended to replace advice given to you by your health care provider. Make sure you discuss any questions you have with your health care provider.   Document Released: 03/13/2008 Document Revised: 10/16/2014 Document Reviewed: 04/08/2012 Elsevier Interactive Patient Education 2016 [place] A contusion is a deep bruise. Contusions happen when an injury causes bleeding under the skin. Symptoms of bruising include pain, swelling, and discolored skin. The skin may turn blue, purple, or yellow. HOME CARE   Rest the injured area.  If told, put ice on the injured area.  Put ice in a plastic bag.  Place a towel between your skin and the bag.  Leave the ice on for 20 minutes, 2-3 times per day.  If told, put light pressure (compression) on the injured area using an elastic bandage. Make sure the bandage is not too tight. Remove it and put it back on as told by  your doctor.  If possible, raise (elevate) the injured area above the level of your heart while you are sitting or lying down.  Take over-the-counter and prescription medicines only as told by your doctor. GET HELP IF:  Your symptoms do not get better after several days of treatment.  Your symptoms get worse.  You have trouble moving the injured area. GET HELP RIGHT AWAY IF:   You have very bad pain.  You have a loss of feeling (numbness) in a hand or foot.  Your hand or foot turns pale or cold.   This information is not intended to replace advice given to you by your health care provider. Make sure you discuss any questions you have with your health care provider.   Document  Released: 03/13/2008 Document Revised: 06/16/2015 Document Reviewed: 02/10/2015 Elsevier Interactive Patient Education 2016 Elsevier Inc[employer]  Cryotherapy Cryotherapy is when you put ice on your injury. Ice helps lessen pain and puffiness (swelling) after an injury. Ice works the best when you start using it in the first 24 to 48 hours after an injury. HOME CARE  Put a dry or damp towel between the ice pack and your skin.  You may press gently on the ice pack.  Leave the ice on for no more than 10 to 20 minutes at a time.  Check your skin after 5 minutes to make sure your skin is okay.  Rest at least 20 minutes between ice pack uses.  Stop using ice when your skin loses feeling (numbness).  Do not use ice on someone who cannot tell you when it hurts. This includes small children and people with memory problems (dementia). GET HELP RIGHT AWAY IF:  You have white spots on your skin.  Your skin turns blue or pale.  Your skin feels waxy or hard.  Your puffiness gets worse. MAKE SURE YOU:   Understand these instructions.  Will watch your condition.  Will get help right away if you are not doing well or get worse.   This information is not intended to replace advice given to you by your health care provider. Make sure you discuss any questions you have with your health care provider.   Document Released: 03/13/2008 Document Revised: 12/18/2011 Document Reviewed: 05/18/2011 Elsevier Interactive Patient Education [employer].

## 2016-07-25 ENCOUNTER — Ambulatory Visit (HOSPITAL_COMMUNITY)
Admission: EM | Admit: 2016-07-25 | Discharge: 2016-07-25 | Disposition: A | Discharge status: Home / Self Care | Payer: BLUE CROSS/BLUE SHIELD | Attending: Family Medicine | Admitting: Family Medicine

## 2016-07-25 ENCOUNTER — Encounter (HOSPITAL_COMMUNITY): Payer: Self-pay | Admitting: Emergency Medicine

## 2016-07-25 DIAGNOSIS — J0100 Acute maxillary sinusitis, unspecified: Secondary | ICD-10-CM

## 2016-07-25 MED ORDER — FLUTICASONE PROPIONATE 50 MCG/ACT NA SUSP: 1.0000 | Freq: Every day | NASAL | 6 refills | Status: DC

## 2016-07-25 MED ORDER — AMOXICILLIN-POT CLAVULANATE 875-125 MG PO TABS
1.0000 | ORAL_TABLET | Freq: Two times a day (BID) | ORAL | 0 refills | Status: DC
Start: 2016-07-25 — End: 2016-10-23

## 2016-07-25 MED ORDER — FLUCONAZOLE 150 MG PO TABS: 150.0000 mg | ORAL_TABLET | Freq: Once | ORAL | 1 refills | Status: AC

## 2016-07-25 NOTE — ED Triage Notes (Signed)
Pt here for cold/sinus inf onset 3 weeks associated w/bilateral redness of eyes, HA, BA, facial pressure, teeth pain, left ear pain  Denies fevers, chills  A&O x4... NAD

## 2016-07-25 NOTE — ED Provider Notes (Signed)
[place]    CSN: [id number] Arrival date & time: 07/25/16  1710     History   Chief Complaint Chief Complaint  Patient presents with  . URI    HPI POLETTE SHIMP is a 55 y.o. female.   This is a 55 year old woman who comes in with 3 weeks of sinus congestion and pressure.  She's noticed that her eyelids are getting sticky in the morning, she's tired much of the time, and is not sleeping well.  Patient works as a Human resources officer for a [employer]      History reviewed. No pertinent past medical history.  Patient Active Problem List   Diagnosis Date Noted  . RECTAL BLEEDING 12/14/2009    Past Surgical History:  Procedure Laterality Date  . bunyonectomy      OB History    No data available       Home Medications    Prior to Admission medications   Medication Sig Start Date End Date Taking? Authorizing Provider  amoxicillin-clavulanate (AUGMENTIN) 875-125 MG tablet Take 1 tablet by mouth every 12 (twelve) hours. 07/25/16   Robyn Haber, MD  fluconazole (DIFLUCAN) 150 MG tablet Take 1 tablet (150 mg total) by mouth once. Repeat if needed 07/25/16 07/25/16  Robyn Haber, MD  fluticasone [facility]) 50 MCG/ACT nasal spray Place 1 spray into both nostrils daily. 07/25/16   Robyn Haber, MD    Family History History reviewed. No pertinent family history.  Social History Social History  Substance Use Topics  . Smoking status: Never Smoker  . Smokeless tobacco: Never Used  . Alcohol use Yes     Comment: occasionally     Allergies   Oxycodone   Review of Systems Review of Systems  Constitutional: Positive for fatigue. Negative for chills, diaphoresis and fever.  HENT: Positive for congestion, ear pain, facial swelling and sinus pressure. Negative for hearing loss, mouth sores, nosebleeds and sore throat.   Eyes: Positive for discharge, redness and itching. Negative for visual disturbance.  Respiratory: Negative.     Cardiovascular: Negative.   Gastrointestinal: Negative.   Musculoskeletal: Positive for myalgias.  Neurological: Positive for headaches.  Psychiatric/Behavioral: Negative.      Physical Exam Triage Vital Signs ED Triage Vitals  Enc Vitals Group     BP 07/25/16 1731 143/91     Pulse Rate 07/25/16 1731 86     Resp 07/25/16 1731 12     Temp 07/25/16 1731 98.1 F (36.7 C)     Temp Source 07/25/16 1731 Oral     SpO2 07/25/16 1731 98 %     Weight --      Height --      Head Circumference --      Peak Flow --      Pain Score 07/25/16 1742 10     Pain Loc --      Pain Edu? --      Excl. in [place]? --    No data found.   Updated Vital Signs BP 143/91 (BP Location: Right Arm)   Pulse 86   Temp 98.1 F (36.7 C) (Oral)   Resp 12   SpO2 98%    Physical Exam  Constitutional: She is oriented to person, place, and time. She appears well-developed and well-nourished. No distress.  HENT:  Head: Normocephalic and atraumatic.  Right Ear: External ear normal.  Left Ear: External ear normal.  Mouth/Throat: Oropharynx is clear and moist. No oropharyngeal exudate.  Mucopurulent discharge and nasal passages  bilaterally  Eyes: Conjunctivae and EOM are normal. Pupils are equal, round, and reactive to light. Right eye exhibits no discharge. Left eye exhibits no discharge. No scleral icterus.  Neck: Normal range of motion. Neck supple.  Pulmonary/Chest: Effort normal.  Musculoskeletal: Normal range of motion.  Neurological: She is alert and oriented to person, place, and time.  Skin: Skin is warm and dry. She is not diaphoretic.  Nursing note and vitals reviewed.    UC Treatments / Results  Labs (all labs ordered are listed, but only abnormal results are displayed) Labs Reviewed - No data to display  EKG  EKG Interpretation None       Radiology No results found.  Procedures Procedures (including critical care time)  Medications Ordered in UC Medications - No data to  display   Initial Impression / Assessment and Plan / UC Course  I have reviewed the triage vital signs and the nursing notes.  Pertinent labs & imaging results that were available during my care of the patient were reviewed by me and considered in my medical decision making (see chart for details).  Clinical Course     Final Clinical Impressions(s) / UC Diagnoses   Final diagnoses:  Subacute maxillary sinusitis    New Prescriptions New Prescriptions   AMOXICILLIN-CLAVULANATE (AUGMENTIN) 875-125 MG TABLET    Take 1 tablet by mouth every 12 (twelve) hours.   FLUCONAZOLE (DIFLUCAN) 150 MG TABLET    Take 1 tablet (150 mg total) by mouth once. Repeat if needed   FLUTICASONE (FLONASE) 50 MCG/ACT NASAL SPRAY    Place 1 spray into both nostrils daily.     Robyn Haber, MD 07/25/16 [phone]

## 2016-08-29 ENCOUNTER — Telehealth: Payer: Self-pay | Admitting: General Practice

## 2016-08-29 ENCOUNTER — Telehealth: Payer: Self-pay | Admitting: *Deleted

## 2016-08-29 DIAGNOSIS — J019 Acute sinusitis, unspecified: Secondary | ICD-10-CM | POA: Diagnosis not present

## 2016-08-29 NOTE — Telephone Encounter (Signed)
Relation to [id number] Call back [place]   Reason for call:  patient states she had a new patient appointment scheduled for 08/30/16 and she was calling to confirm, chart doesn't reflect, please advise if 11:30am 30 minute hospital follow up can be used. Please advise

## 2016-08-29 NOTE — Telephone Encounter (Signed)
Unable to reach patient at time of Pre-Visit Call.  Voice mail not set up.

## 2016-08-29 NOTE — Telephone Encounter (Signed)
That will be fine. 

## 2016-08-29 NOTE — Telephone Encounter (Signed)
Patient scheduled for 08/30/2016 at 11:30am

## 2016-08-30 ENCOUNTER — Telehealth: Payer: Self-pay | Admitting: General Practice

## 2016-08-30 ENCOUNTER — Ambulatory Visit: Payer: Managed Care, Other (non HMO) | Admitting: Family Medicine

## 2016-08-30 ENCOUNTER — Ambulatory Visit: Payer: Self-pay | Admitting: Family Medicine

## 2016-08-30 NOTE — Telephone Encounter (Signed)
No charge, please remind of policy

## 2016-08-30 NOTE — Telephone Encounter (Signed)
Patient lvm 08/30/16 at 8:59am cancelling her 1:15pm new patient appointment, charge or no charge

## 2016-10-23 ENCOUNTER — Ambulatory Visit (INDEPENDENT_AMBULATORY_CARE_PROVIDER_SITE_OTHER): Payer: BLUE CROSS/BLUE SHIELD | Admitting: Family Medicine

## 2016-10-23 ENCOUNTER — Encounter: Payer: Self-pay | Admitting: Family Medicine

## 2016-10-23 VITALS — BP 132/84 | HR 75 | Temp 99.1°F | Ht 69.0 in | Wt 224.2 lb

## 2016-10-23 DIAGNOSIS — Z131 Encounter for screening for diabetes mellitus: Secondary | ICD-10-CM | POA: Diagnosis not present

## 2016-10-23 DIAGNOSIS — Z23 Encounter for immunization: Secondary | ICD-10-CM | POA: Diagnosis not present

## 2016-10-23 DIAGNOSIS — Z1159 Encounter for screening for other viral diseases: Secondary | ICD-10-CM | POA: Diagnosis not present

## 2016-10-23 DIAGNOSIS — Z1329 Encounter for screening for other suspected endocrine disorder: Secondary | ICD-10-CM | POA: Diagnosis not present

## 2016-10-23 DIAGNOSIS — Z130 Encounter for screening for diseases of the blood and blood-forming organs and certain disorders involving the immune mechanism: Secondary | ICD-10-CM | POA: Diagnosis not present

## 2016-10-23 DIAGNOSIS — Z13220 Encounter for screening for lipoid disorders: Secondary | ICD-10-CM | POA: Diagnosis not present

## 2016-10-23 DIAGNOSIS — E669 Obesity, unspecified: Secondary | ICD-10-CM

## 2016-10-23 DIAGNOSIS — H04123 Dry eye syndrome of bilateral lacrimal glands: Secondary | ICD-10-CM | POA: Diagnosis not present

## 2016-10-23 DIAGNOSIS — N926 Irregular menstruation, unspecified: Secondary | ICD-10-CM | POA: Diagnosis not present

## 2016-10-23 DIAGNOSIS — M2550 Pain in unspecified joint: Secondary | ICD-10-CM

## 2016-10-23 LAB — FOLLICLE STIMULATING HORMONE: FSH: 66.6 m[IU]/mL

## 2016-10-23 LAB — CBC
HEMATOCRIT: 43.4 % (ref 36.0–46.0)
HEMOGLOBIN: 14.6 g/dL (ref 12.0–15.0)
MCHC: 33.7 g/dL (ref 30.0–36.0)
MCV: 89 fl (ref 78.0–100.0)
Platelets: 157 10*3/uL (ref 150.0–400.0)
RBC: 4.87 Mil/uL (ref 3.87–5.11)
RDW: 14.9 % (ref 11.5–15.5)
WBC: 6.2 10*3/uL (ref 4.0–10.5)

## 2016-10-23 LAB — LIPID PANEL
CHOL/HDL RATIO: 3
Cholesterol: 168 mg/dL (ref 0–200)
HDL: 57.4 mg/dL (ref >39.00)
LDL CALC: 92 mg/dL (ref 0–99)
NONHDL: 110.77
Triglycerides: 96 mg/dL (ref 0.0–149.0)
VLDL: 19.2 mg/dL (ref 0.0–40.0)

## 2016-10-23 LAB — COMPREHENSIVE METABOLIC PANEL
ALT: 13 U/L (ref 0–35)
AST: 23 U/L (ref 0–37)
Albumin: 3.9 g/dL (ref 3.5–5.2)
Alkaline Phosphatase: 72 U/L (ref 39–117)
BUN: 10 mg/dL (ref 6–23)
CHLORIDE: 105 meq/L (ref 96–112)
CO2: 27 meq/L (ref 19–32)
Calcium: 9.7 mg/dL (ref 8.4–10.5)
Creatinine, Ser: 0.83 mg/dL (ref 0.40–1.20)
GFR: 92.3 mL/min (ref >60.00)
GLUCOSE: 74 mg/dL (ref 70–99)
POTASSIUM: 4 meq/L (ref 3.5–5.1)
SODIUM: 138 meq/L (ref 135–145)
Total Bilirubin: 0.7 mg/dL (ref 0.2–1.2)
Total Protein: 7.2 g/dL (ref 6.0–8.3)

## 2016-10-23 LAB — TSH: TSH: 1.38 u[IU]/mL (ref 0.35–4.50)

## 2016-10-23 LAB — HEPATITIS C ANTIBODY: HCV Ab: NEGATIVE

## 2016-10-23 LAB — HEMOGLOBIN A1C: Hgb A1c MFr Bld: 5.4 % (ref 4.6–6.5)

## 2016-10-23 NOTE — Progress Notes (Addendum)
[place] at [facility] [address] [place], [place] [address] [phone]  Date:  10/23/2016   Name:  Joanne Kim   DOB:  06-17-1961   MRN:  [id number]  PCP:  Lamar Blinks, MD    Chief Complaint: Establish Care (Pt here to est care. Would like to discuss menopause and sinus concerns. )   History of Present Illness:  Joanne Kim is a 56 y.o. very pleasant female patient who presents with the following:  Here today as a new patient.  Her LMP was 12/1- more details below She sees Dr. Jenne Pane for GI. Last colonoscopy was in 2011. 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    She has a history of sinus issues and allergies, and she did have borderline HTN in the past.  However her BP is now looking better since she has decreased stress in her life.  BP Readings from Last 3 Encounters:  10/23/16 132/84  07/25/16 143/91  04/24/16 154/81   Never took any BP meds- she does have a home cuff and was monitoring her BP for some time but does not really do this now  Most recent labs about one year ago Never a smoker, occasional alcohol She does have hemorrhoids that will bleed and bother her at times. These were evaluated by GI She is fasting today so we can do her labs    She was on depo shots for a long time but thought that she should stop- her last shot was in August of 2017. She did have some spotting in December. Otherwise no bleeding. She wonders if she is menopausal- she does have some hot flashes, sometimes feels like her memory is not as good.  She does not wish to consider HRT at this time however She has noted that her eyes are dry recently- she uses glasses, rarely contacts. She is using an OTC visine drop as needed. Notes that her eyes will feel very dry and irritated as the day goes on, esp in the evening.  No vision change however She also notes  that her joints will ache and feel tired all over- no particular joints effected.  She thinks this is due to lack of exercise and notes that she will feel better when she is more active.  She would like to start exercising more and is thinking of joining a gyn  Patient Active Problem List   Diagnosis Date Noted  . RECTAL BLEEDING 12/14/2009    Past Medical History:  Diagnosis Date  . Elevated blood pressure reading   . History of colon polyps     Past Surgical History:  Procedure Laterality Date  . bunyonectomy      Social History  Substance Use Topics  . Smoking status: Never Smoker  . Smokeless tobacco: Never Used  . Alcohol use Yes     Comment: occasionally    No family history on file.  Allergies  Allergen Reactions  . Oxycodone Itching    Medication list has been reviewed and updated.  No current outpatient prescriptions on file prior to visit.   No current facility-administered medications on file prior to visit.     Review of Systems:  As per HPI- otherwise negative.   Physical Examination: Vitals:   10/23/16 0945  BP: 132/84  Pulse: 75  Temp: 99.1 F (37.3 C)  Vitals:   10/23/16 0945  Weight: 224 lb 3.2 oz (101.7 kg)  Height: 5\' 9"  (1.753 m)   Body mass index is 33.11 kg/m. Ideal Body Weight: Weight in (lb) to have BMI = 25: 168.9  GEN: WDWN, NAD, Non-toxic, A & O x 3, obese, looks well HEENT: Atraumatic, Normocephalic. Neck supple. No masses, No LAD.  Bilateral TM wnl, oropharynx normal.  PEERL,EOMI.   Ears and Nose: No external deformity. CV: RRR, No M/G/R. No JVD. No thrill. No extra heart sounds. PULM: CTA B, no wheezes, crackles, rhonchi. No retractions. No resp. distress. No accessory muscle use. ABD: S, NT, ND. No rebound. No HSM. EXTR: No c/c/e NEURO Normal gait.  PSYCH: Normally interactive. Conversant. Not depressed or anxious appearing.  Calm demeanor.    Assessment and Plan: Dry eyes  Screening for deficiency anemia -  Plan: CBC  Screening for diabetes mellitus - Plan: Comprehensive metabolic panel, Hemoglobin A1c  Screening for thyroid disorder - Plan: TSH  Encounter for hepatitis C screening test for low risk patient - Plan: Hepatitis C antibody  Screening for hyperlipidemia - Plan: Lipid panel  Menstrual irregularity - Plan: FSH  Arthralgia, unspecified joint  Immunization due - Plan: Tdap vaccine greater than or equal to 7yo IM  Here today as a new patient to establish care- per pt instructions-  It looks like your next colonoscopy will be due in 2021 I will be in touch with your labs asap - we will see if you appear to be in menopause via your [facility] level.  As long as your symptoms are not severe I would encourage you to stay away from hormone replacement You got your tetanus booster today- this is good for 10 years.  Try some dry eye gel and stay away from redness drops if you can- these can start to make you worse. If the dry eye gel does not help I would encourage you to see your eye doctor Moving around more at work and at home can be helpful for your joints- try to walk as much as you can.   Weight training can also be very helpful for overall fitness, and a step counter can be very motivating!    It was a pleasure to see you today- unless your labs suggest otherwise let's recheck in 6 months   Signed Lamar Blinks, MD  Received her labs- letter to pt  Your labs overall look very good- hep C screening is negative, thyroid normal, blood sugar normal, cholesterol is fine.  Your FSH is high- this is consistent with a menopausal state.   I am going to set up an ultrasound for you to make sure that your endometrium (uterine lining) appears normal.   We will be in touch with these details  Will order pelvic US

## 2016-10-23 NOTE — Progress Notes (Signed)
Pre visit review using our clinic review tool, if applicable. No additional management support is needed unless otherwise documented below in the visit note. 

## 2016-10-23 NOTE — Patient Instructions (Addendum)
It looks like your next colonoscopy will be due in 2021 I will be in touch with your labs asap - we will see if you appear to be in menopause via your [facility] level.  As long as your symptoms are not severe I would encourage you to stay away from hormone replacement You got your tetanus booster today- this is good for 10 years.  Try some dry eye gel and stay away from redness drops if you can- these can start to make you worse. If the dry eye gel does not help I would encourage you to see your eye doctor Moving around more at work and at home can be helpful for your joints- try to walk as much as you can.   Weight training can also be very helpful for overall fitness, and a step counter can be very motivating!    It was a pleasure to see you today- unless your labs suggest otherwise let's recheck in 6 months

## 2016-10-23 NOTE — Addendum Note (Signed)
Addended by: Lamar Blinks C on: 10/23/2016 08:56 PM   Modules accepted: Orders

## 2016-10-24 ENCOUNTER — Telehealth: Payer: Self-pay | Admitting: Emergency Medicine

## 2016-10-24 DIAGNOSIS — N926 Irregular menstruation, unspecified: Secondary | ICD-10-CM

## 2016-10-24 NOTE — Telephone Encounter (Signed)
Tried to contact pt to discuss lab results. No answer, left message for pt to return call.

## 2016-10-24 NOTE — Telephone Encounter (Deleted)
-----   Message from [name], MD sent at 10/23/2016  8:54 PM EST ----- Please give her a call- her labs look fine. A letter is on the way to her. Her [place] lab shows that she does appear to be in menopause.  I am going to set up an ultrasound for her to check the lining of her uterus and make sure that her recent bleeding is not any cause of concern.  We will arrange this [place] for her

## 2016-10-24 NOTE — Telephone Encounter (Signed)
-----   Message from [place] sent at 10/24/2016 10:31 AM EST ----- Regarding: US Pelvis [place],  May you please add a [place] trans-vaginal non OB if the patient is not pregnant, per radiology protocol the two have to be done together.   Thank you, [place] - [facility]

## 2016-10-24 NOTE — Telephone Encounter (Signed)
-----   Message from [name], MD sent at 10/23/2016  8:54 PM EST ----- Please give her a call- her labs look fine. A letter is on the way to her. Her [place] lab shows that she does appear to be in menopause.  I am going to set up an ultrasound for her to check the lining of her uterus and make sure that her recent bleeding is not any cause of concern.  We will arrange this [place] for her

## 2016-10-24 NOTE — Telephone Encounter (Signed)
Pt called back. Discussed results and provider recommendations. Pt verbalized understanding.

## 2016-10-26 ENCOUNTER — Ambulatory Visit (HOSPITAL_BASED_OUTPATIENT_CLINIC_OR_DEPARTMENT_OTHER): Payer: BLUE CROSS/BLUE SHIELD

## 2016-10-26 ENCOUNTER — Ambulatory Visit (HOSPITAL_BASED_OUTPATIENT_CLINIC_OR_DEPARTMENT_OTHER): Admission: RE | Admit: 2016-10-26 | Payer: BLUE CROSS/BLUE SHIELD | Source: Ambulatory Visit

## 2016-10-27 ENCOUNTER — Ambulatory Visit (HOSPITAL_BASED_OUTPATIENT_CLINIC_OR_DEPARTMENT_OTHER): Admission: RE | Admit: 2016-10-27 | Payer: BLUE CROSS/BLUE SHIELD | Source: Ambulatory Visit

## 2016-10-28 ENCOUNTER — Ambulatory Visit (HOSPITAL_BASED_OUTPATIENT_CLINIC_OR_DEPARTMENT_OTHER): Payer: BLUE CROSS/BLUE SHIELD

## 2016-10-30 ENCOUNTER — Ambulatory Visit (HOSPITAL_BASED_OUTPATIENT_CLINIC_OR_DEPARTMENT_OTHER): Payer: BLUE CROSS/BLUE SHIELD

## 2016-11-02 ENCOUNTER — Ambulatory Visit (HOSPITAL_BASED_OUTPATIENT_CLINIC_OR_DEPARTMENT_OTHER)
Admission: RE | Admit: 2016-11-02 | Discharge: 2016-11-02 | Disposition: A | Discharge status: Home / Self Care | Payer: BLUE CROSS/BLUE SHIELD | Source: Ambulatory Visit | Attending: Family Medicine | Admitting: Family Medicine

## 2016-11-02 DIAGNOSIS — D289 Benign neoplasm of female genital organ, unspecified: Secondary | ICD-10-CM | POA: Insufficient documentation

## 2016-11-02 DIAGNOSIS — N926 Irregular menstruation, unspecified: Secondary | ICD-10-CM | POA: Diagnosis not present

## 2016-11-02 DIAGNOSIS — D259 Leiomyoma of uterus, unspecified: Secondary | ICD-10-CM | POA: Diagnosis not present

## 2016-11-05 ENCOUNTER — Telehealth: Payer: Self-pay | Admitting: Family Medicine

## 2016-11-05 DIAGNOSIS — N950 Postmenopausal bleeding: Secondary | ICD-10-CM

## 2016-11-05 NOTE — Telephone Encounter (Signed)
Called and spoke with pt- her [place] is ok except her uterine lining is a bit thicker than I would like to see.  Given her recent bleeding episode with a high progesterone will refer her to see OBG. She is ok with plan

## 2016-11-08 ENCOUNTER — Telehealth: Payer: Self-pay | Admitting: Family Medicine

## 2016-11-08 NOTE — Telephone Encounter (Signed)
Patient called stating that she is not going to be abe to see Dr. Matthew Saras due to personal financial reasons and she would like a referral to another GYN office. Please advise  Phone: [phone]

## 2016-11-08 NOTE — Telephone Encounter (Signed)
How about the OBG office right in our building?

## 2016-11-08 NOTE — Telephone Encounter (Signed)
Suggest another provider?

## 2016-11-09 NOTE — Telephone Encounter (Signed)
Referral placed in [facility] work que, awaiting appt

## 2016-12-11 ENCOUNTER — Encounter: Payer: Self-pay | Admitting: Obstetrics & Gynecology

## 2017-01-04 ENCOUNTER — Ambulatory Visit (INDEPENDENT_AMBULATORY_CARE_PROVIDER_SITE_OTHER): Payer: BLUE CROSS/BLUE SHIELD | Admitting: Family Medicine

## 2017-01-04 ENCOUNTER — Encounter: Payer: Self-pay | Admitting: Family Medicine

## 2017-01-04 VITALS — BP 120/79 | HR 73 | Temp 98.6°F | Resp 16 | Ht 69.0 in | Wt 220.4 lb

## 2017-01-04 DIAGNOSIS — J0100 Acute maxillary sinusitis, unspecified: Secondary | ICD-10-CM

## 2017-01-04 MED ORDER — AMOXICILLIN-POT CLAVULANATE 875-125 MG PO TABS: 1.0000 | ORAL_TABLET | Freq: Two times a day (BID) | ORAL | 0 refills | Status: DC

## 2017-01-04 NOTE — Progress Notes (Signed)
Pre visit review using our clinic review tool, if applicable. No additional management support is needed unless otherwise documented below in the visit note. 

## 2017-01-04 NOTE — Patient Instructions (Signed)
Claritin (loratadine), Allegra (fexofenadine), Zyrtec (cetirizine); these are listed in order from weakest to strongest. Generic, and therefore cheaper, options are in the parentheses.   Flonase (fluticasone); nasal spray that is over the counter. 2 sprays each nostril, once daily. Aim towards the same side eye when you spray.  There are available OTC, and the generic versions, which may be cheaper, are in parentheses. Show this to a pharmacist if you have trouble finding any of these items.  

## 2017-01-04 NOTE — Progress Notes (Signed)
Chief Complaint  Patient presents with  . Headache    Pt reports facial pressure and teeth hurting think she may have sinus infection     Almond Lint here for URI complaints.  Duration: 2 weeks  Associated symptoms: sinus congestion, sinus pain, itchy watery eyes, ear fullness and dental pain Denies: ear drainage, sore throat, shortness of breath and cough, fevers/rigors Treatment to date: Sinus medicine OTC Sick contacts: No  ROS:  Const: Denies fevers HEENT: As noted in HPI Lungs: No SOB  Past Medical History:  Diagnosis Date  . Elevated blood pressure reading   . History of colon polyps    Family History  Problem Relation Age of Onset  . Heart disease Other   . Stroke Other   . Hypertension Other     BP 120/79 (BP Location: Left Arm, Patient Position: Sitting, Cuff Size: Large)   Pulse 73   Temp 98.6 F (37 C) (Oral)   Resp 16   Ht 5\' 9"  (1.753 m)   Wt 220 lb 6.4 oz (100 kg)   SpO2 100%   BMI 32.55 kg/m  General: Awake, alert, appears stated age HEENT: AT, [place], ears patent b/l and TM's neg, nares patent w/o discharge, +Max sinus TTP, pharynx pink and without exudates, MMM Neck: No masses or asymmetry Heart: RRR, no murmurs, no bruits Lungs: CTAB, no accessory muscle use Psych: Age appropriate judgment and insight, normal mood and affect  Acute non-recurrent maxillary sinusitis - Plan: amoxicillin-clavulanate (AUGMENTIN) 875-125 MG tablet  Orders as above. Given duration, s/s's will tx.  Also recommended PO antihistamines and INCS as there is likely an allergic component.  Continue to push fluids, practice good hand hygiene.  F/u prn. If starting to experience fevers, shaking, or shortness of breath, seek immediate care. Pt voiced understanding and agreement to the plan.  [place], DO 01/04/17 2:34 PM

## 2017-01-10 ENCOUNTER — Encounter: Payer: Self-pay | Admitting: Obstetrics & Gynecology

## 2017-01-10 ENCOUNTER — Ambulatory Visit (INDEPENDENT_AMBULATORY_CARE_PROVIDER_SITE_OTHER): Payer: BLUE CROSS/BLUE SHIELD | Admitting: Obstetrics & Gynecology

## 2017-01-10 VITALS — BP 122/73 | HR 75 | Resp 16 | Ht 69.0 in | Wt 216.0 lb

## 2017-01-10 DIAGNOSIS — N920 Excessive and frequent menstruation with regular cycle: Secondary | ICD-10-CM

## 2017-01-10 DIAGNOSIS — R87810 Cervical high risk human papillomavirus (HPV) DNA test positive: Secondary | ICD-10-CM

## 2017-01-10 NOTE — Progress Notes (Signed)
   GYNECOLOGY OFFICE VISIT NOTE  History:  56 y.o. G3P1020 here today for evaluation of spotting episode in 09/2016; about three months after last Depo Provera injection.  She mentioned this to her PCP and [place] was checked which returned at 66; pelvic scan showed homogenous 9 mm stripe.  The PCP was concerned about possibility of postmenopausal bleeding and sent her here for further evaluation. Patient reports she just saw a tiny smear on tissue once in 09/2016, no further bleeding or pain.  She denies any abnormal vaginal discharge, bleeding, pelvic pain or other concerns.   Past Medical History:  Diagnosis Date  . Elevated blood pressure reading   . History of colon polyps     Past Surgical History:  Procedure Laterality Date  . bunyonectomy      The following portions of the patient's history were reviewed and updated as appropriate: allergies, current medications, past family history, past medical history, past social history, past surgical history and problem list.   Health Maintenance:  Normal pap but positive HPV on in 10/2015. Normal mammogram in 11/22/2015.    Review of Systems:  Pertinent items noted in HPI and remainder of comprehensive ROS otherwise negative.   Objective:  Physical Exam BP 122/73   Pulse 75   Resp 16   Ht 5\' 9"  (1.753 m)   Wt 216 lb (98 kg)   LMP 10/12/2016   BMI 31.90 kg/m  CONSTITUTIONAL: Well-developed, well-nourished female in no acute distress.  NEUROLOGIC: Alert and oriented to person, place, and time. Normal reflexes, muscle tone coordination. No cranial nerve deficit noted. PSYCHIATRIC: Normal mood and affect. Normal behavior. Normal judgment and thought content. CARDIOVASCULAR: Normal heart rate noted RESPIRATORY:  Effort and breath sounds normal, no problems with respiration noted. ABDOMEN: No distention noted.   PELVIC: Deferred MUSCULOSKELETAL: Normal range of motion.   Labs and Imaging 11/03/2016 TRANSABDOMINAL AND TRANSVAGINAL  ULTRASOUND OF PELVIS CLINICAL DATA:  Irregular menses COMPARISON:  None FINDINGS: Uterus Measurements: 7.1 x 3.6 x 4.8 cm. A 1.6 cm hypoechoic structure is noted at the fundus of the uterus consistent with small fibroid. Endometrium Thickness: 9 mm.  No focal abnormality visualized. Right ovary Measurements: 2.7 x 1.0 x 0.9 cm. Normal appearance/no adnexal mass. Left ovary Measurements: 3.0 x 1.4 x 1.3 cm. Normal appearance/no adnexal mass. Other findings No abnormal free fluid. IMPRESSION: Small uterine fibroid.  No other focal abnormality is seen. Electronically Signed   By: Inez Catalina M.D.   On: 11/03/2016 08:31   Assessment & Plan:  1. Spotting Spotting likely secondary to hormonal withdrawal after last shot of Depo Provera as it occurred right when the next dose would be due.  Pelvic scan is normal and reassuring.  No recurrence of bleeding.  No need for endometrial biopsy at this point, will continue to monitor closely. If bleeding recurs, will proceed with endometrial sampling.   2. Cervical high risk HPV (human papillomavirus) test positive Patient needs repeat cotesting soon. This will be scheduled.  Routine preventative health maintenance measures emphasized. Please refer to After Visit Summary for other counseling recommendations.    Total face-to-face time with patient: 15  minutes. Over 50% of encounter was spent on counseling and coordination of care.   Verita Schneiders, MD, [place] Attending [place], [facility] for [employer], [place]

## 2017-01-10 NOTE — Patient Instructions (Signed)
Return to clinic for any scheduled appointments or for any gynecologic concerns as needed.   

## 2017-01-15 ENCOUNTER — Telehealth: Payer: Self-pay | Admitting: Family Medicine

## 2017-01-15 MED ORDER — FLUCONAZOLE 150 MG PO TABS: 150.0000 mg | ORAL_TABLET | Freq: Once | ORAL | 0 refills | Status: AC

## 2017-01-15 NOTE — Telephone Encounter (Signed)
Done. TY.

## 2017-01-15 NOTE — Telephone Encounter (Signed)
Caller name: Keyonia Gluth Relationship to patient: self Can be reached: [phone] Pharmacy: [id number] - [place], [place]  Reason for call: Pt states that abx have given her a yeast inf. Please call in diflucan for her.

## 2017-01-15 NOTE — Telephone Encounter (Signed)
Please advise.//AB/CMA 

## 2017-01-31 ENCOUNTER — Telehealth: Payer: Self-pay | Admitting: Family Medicine

## 2017-01-31 NOTE — Telephone Encounter (Signed)
Caller name: Kennyth Lose Relationship to patient: self Can be reached: [phone] Pharmacy: [id number] - [place], [place]  Reason for call: pt still having sinus pressure, vertigo type symptoms, some drainage, sneezing. Pt wondering if another abx can be called in or if she needs to have another appt. Pt also not sure that yeast inf is gone. Pt saw Dr. Nani Ravens 01/04/17. Please advise.

## 2017-01-31 NOTE — Telephone Encounter (Signed)
Called and spoke with the pt and informed her that since it has been awhile since she was seen she will need to come in for a follow-up, so that the provider will know what they are treating.  Pt verbalized understanding and agreed.//AB/CMA

## 2017-02-01 ENCOUNTER — Ambulatory Visit (HOSPITAL_BASED_OUTPATIENT_CLINIC_OR_DEPARTMENT_OTHER)
Admission: RE | Admit: 2017-02-01 | Discharge: 2017-02-01 | Disposition: A | Discharge status: Home / Self Care | Payer: BLUE CROSS/BLUE SHIELD | Source: Ambulatory Visit | Attending: Family Medicine | Admitting: Family Medicine

## 2017-02-01 ENCOUNTER — Ambulatory Visit (INDEPENDENT_AMBULATORY_CARE_PROVIDER_SITE_OTHER): Payer: BLUE CROSS/BLUE SHIELD | Admitting: Family Medicine

## 2017-02-01 VITALS — BP 132/82 | HR 82 | Temp 98.0°F | Ht 69.0 in | Wt 217.8 lb

## 2017-02-01 DIAGNOSIS — R5383 Other fatigue: Secondary | ICD-10-CM | POA: Diagnosis not present

## 2017-02-01 DIAGNOSIS — J3489 Other specified disorders of nose and nasal sinuses: Secondary | ICD-10-CM

## 2017-02-01 DIAGNOSIS — B373 Candidiasis of vulva and vagina: Secondary | ICD-10-CM | POA: Diagnosis not present

## 2017-02-01 DIAGNOSIS — S0993XA Unspecified injury of face, initial encounter: Secondary | ICD-10-CM | POA: Diagnosis not present

## 2017-02-01 DIAGNOSIS — B3731 Acute candidiasis of vulva and vagina: Secondary | ICD-10-CM

## 2017-02-01 MED ORDER — FLUCONAZOLE 150 MG PO TABS: 150.0000 mg | ORAL_TABLET | Freq: Once | ORAL | 0 refills | Status: AC

## 2017-02-01 MED ORDER — FLUTICASONE PROPIONATE 50 MCG/ACT NA SUSP: 2.0000 | Freq: Every day | NASAL | 6 refills | Status: DC

## 2017-02-01 MED ORDER — PREDNISONE 20 MG PO TABS
ORAL_TABLET | ORAL | 0 refills | Status: DC
Start: 2017-02-01 — End: 2017-02-14

## 2017-02-01 NOTE — Patient Instructions (Addendum)
Please go have your sinus x-rays, then you can go home. I will be in touch with your results asap Let's have you try the prednisone for 10 days- use as directed You can continue your zyrtec and OTC meds as needed; however avoid NSAIDs while on prednisone We will also give you another 2 doses of dilfucan to use for yeast infection- let me know if this does not resolve your symptoms totally   We will also check your ferritin and B12 levels today- I will let you know what these show

## 2017-02-01 NOTE — Progress Notes (Addendum)
[place] at [facility] [address], [place], [place] [address] [phone]  Date:  02/01/2017   Name:  Joanne Kim   DOB:  18-Jan-1961   MRN:  [id number]  PCP:  Lamar Blinks, MD    Chief Complaint: Sinusitis (c/o sinus infection. pt states that she still has sinus pressure, sneezing, teeth sensivity, feels like "head is full" )   History of Present Illness:  Joanne Kim is a 56 y.o. very pleasant female patient who presents with the following:  Was seen for a sinus infection about a month ago and treated with augmentin.  However she notes that she still has a lot of pressure in her sinuses. She has noted a little swelling around her eyes.  She feels like her head is "full," she notes pressure and pain in her sinuses still.   She has not noted a fever, but has "just not felt well."  She has noted some sinus concerns for about a year; she moved into a new building for work and thinks that she may be allergic to something in the building  No cough, occasional sneezing. Dry, itchy and irritated, red eyes She is using zyrtec, she finished the abx but it did not seem to help and caused a yeast infection which she is still trying to get rid of.  She did take one dose of diflucan which helped some.    She is also using benadryl, has tried applying heat to her sinuses   She has not generally had a lot of problems with her sinuses in the past - at least not chronic like she has now  She does not snore as far as she knows.  She does not think that she has OSA She is SA and uses condoms with her SA.  She is not concerned about STI   Patient Active Problem List   Diagnosis Date Noted  . Dry eyes 10/23/2016  . Obesity 10/23/2016  . RECTAL BLEEDING 12/14/2009    Past Medical History:  Diagnosis Date  . Elevated blood pressure reading   . History of colon polyps     Past Surgical History:  Procedure Laterality Date   . bunyonectomy      Social History  Substance Use Topics  . Smoking status: Never Smoker  . Smokeless tobacco: Never Used  . Alcohol use Yes     Comment: occasionally    Family History  Problem Relation Age of Onset  . Heart disease Other   . Stroke Other   . Hypertension Other     Allergies  Allergen Reactions  . Oxycodone Itching    Medication list has been reviewed and updated.  Current Outpatient Prescriptions on File Prior to Visit  Medication Sig Dispense Refill  . Ginkgo Biloba (GNP GINGKO BILOBA EXTRACT PO) Take 1 capsule by mouth daily.    . Multiple Vitamin (MULTIVITAMIN) tablet Take 1 tablet by mouth daily.     No current facility-administered medications on file prior to visit.     Review of Systems:  As per HPI- otherwise negative.   Physical Examination: Vitals:   02/01/17 1605  BP: 132/82  Pulse: 82  Temp: 98 F (36.7 C)   Vitals:   02/01/17 1605  Weight: 217 lb 12.8 oz (98.8 kg)  Height: 5\' 9"  (1.753 m)   Body mass index is 32.16 kg/m. Ideal Body Weight: Weight in (lb) to have BMI = 25: 168.9  GEN: WDWN, NAD, Non-toxic, A & O x 3, obese, looks well HEENT: Atraumatic, Normocephalic. Neck supple. No masses, No LAD.  Bilateral TM wnl, oropharynx normal.  PEERL,EOMI.  She has bilateral "allergic shiners" and tenderness to percussion of her frontal sinuses.  Nasal cavity is inflamed Ears and Nose: No external deformity. CV: RRR, No M/G/R. No JVD. No thrill. No extra heart sounds. PULM: CTA B, no wheezes, crackles, rhonchi. No retractions. No resp. distress. No accessory muscle use. EXTR: No c/c/e NEURO Normal gait.  PSYCH: Normally interactive. Conversant. Not depressed or anxious appearing.  Calm demeanor.    Assessment and Plan: Sinus pain  Sinus pressure - Plan: predniSONE (DELTASONE) 20 MG tablet, DG Sinuses Complete  Yeast vaginitis - Plan: fluconazole (DIFLUCAN) 150 MG tablet  Here today with persistent sinus pain. I suspect  that at this time her sx are due to inflammation and not infection but will get sinus films to look for any air fluid level.  Will treat with prednisone  Also refilled her diflucan for persistent yeast vaginitis- asked her to let me know if this does not finally resolve her symptoms.    Signed Lamar Blinks, MD  Received her labs and x-ray, 4/27.  Gave her a call- however VM full, could not LM Will mail results to her  Dg Sinuses Complete  Result Date: 02/02/2017 CLINICAL DATA:  Sinus infections off and on x 1 year, h/o of seasonal allergies and sinus infections every spring and fall. Not worse on any side, hurts mainly in face, under eyes. EXAM: PARANASAL SINUSES - COMPLETE 3 + VIEW COMPARISON:  None. FINDINGS: The paranasal sinus are aerated. There is no evidence of sinus opacification air-fluid levels or mucosal thickening. No significant bone abnormalities are seen. Multiple dental restorations. IMPRESSION: Negative. Electronically Signed   By: Lucrezia Europe M.D.   On: 02/02/2017 08:43   Results for orders placed or performed in visit on 02/01/17  Ferritin  Result Value Ref Range   Ferritin 26.3 10.0 - 291.0 ng/mL  B12  Result Value Ref Range   Vitamin B-12 991 (H) 211 - 911 pg/mL

## 2017-02-02 LAB — FERRITIN: FERRITIN: 26.3 ng/mL (ref 10.0–291.0)

## 2017-02-02 LAB — VITAMIN B12: VITAMIN B 12: 991 pg/mL — AB (ref 211–911)

## 2017-02-05 ENCOUNTER — Telehealth: Payer: Self-pay | Admitting: Family Medicine

## 2017-02-05 NOTE — Telephone Encounter (Signed)
Pt called in to follow up on her imaging results. Informed pt that provider will call her once results are reviewed.   Pt expressed understanding.

## 2017-02-05 NOTE — Telephone Encounter (Signed)
A copy of her labs x-ray were mailed to her.  We can go over it with her on the phone.  I would suggest that she set up her mychart for the fastest results

## 2017-02-05 NOTE — Telephone Encounter (Signed)
Called patient with lab results per her request. Informed letter has been mailed.

## 2017-02-12 ENCOUNTER — Ambulatory Visit: Payer: Self-pay | Admitting: Obstetrics & Gynecology

## 2017-02-14 ENCOUNTER — Encounter: Payer: Self-pay | Admitting: Family Medicine

## 2017-02-14 ENCOUNTER — Telehealth: Payer: Self-pay

## 2017-02-14 ENCOUNTER — Ambulatory Visit (INDEPENDENT_AMBULATORY_CARE_PROVIDER_SITE_OTHER): Payer: BLUE CROSS/BLUE SHIELD | Admitting: Family Medicine

## 2017-02-14 VITALS — BP 124/86 | HR 77 | Temp 97.4°F | Ht 69.0 in | Wt 217.0 lb

## 2017-02-14 DIAGNOSIS — J301 Allergic rhinitis due to pollen: Secondary | ICD-10-CM

## 2017-02-14 DIAGNOSIS — R5383 Other fatigue: Secondary | ICD-10-CM | POA: Diagnosis not present

## 2017-02-14 MED ORDER — OLOPATADINE HCL 0.2 % OP SOLN: OPHTHALMIC | 6 refills | Status: DC

## 2017-02-14 MED ORDER — MONTELUKAST SODIUM 10 MG PO TABS: 10.0000 mg | ORAL_TABLET | Freq: Every day | ORAL | 6 refills | Status: DC

## 2017-02-14 NOTE — Patient Instructions (Signed)
It was nice to see you today- I am sorry that you are having such a hard time!   It does sound like you are simply doing too much and not allowing enough time for sleep and rest/ self care.  If you do decide that you would like to try a medication for depression please let me know- also please seek care right away if you have any concerns about self harm  For allergies, continue to use your zyrtec.  We will add singulair 10 mg once a day to your regimen.    For your itchy eyes, I called in an antihistamine eye drop for you.  However if this is too expensive you may also have good results with a moisturizing eye gel such as Genteal dry eye gel

## 2017-02-14 NOTE — Progress Notes (Signed)
Pre visit review using our clinic tool,if applicable. No additional management support is needed unless otherwise documented below in the visit note.  

## 2017-02-14 NOTE — Progress Notes (Signed)
[place] at [facility] [address], [place], [place] [address] [phone]  Date:  02/14/2017   Name:  Joanne Kim   DOB:  02/23/1961   MRN:  [id number]  PCP:  [name], MD    Chief Complaint: Fatigue (Nasal dryness)   History of Present Illness:  Joanne Kim is a 56 y.o. very pleasant female patient who presents with the following:  Here today for a recheck- last seen here about 2 weeks ago when I gave her a short course of prednisone for her sinuses.  She notes that this "worked like Oceanographer," however a couple of days after she finished this she again noted head congestion, dry feeling nose. We had her use prednisone for 10 days - 40 for 4 and 20 for 6 days Some nausea but no vomiting Slight HA off an on  No cough, no ST, no fever.  No sneezing, but she does have an itchy nose   She is out of flonase  She is taking zyrtec otc and benadryl at night  She just feels really tired.   Her eyes were really irritated yesterday.   On further discussion her real issue is that she is feeling exhausted and down in the dumps  Admits that her life is kind of overwhelming right now.  She is feeling somewhat down, and is having some anxiety "for the first time in my life."  She is driving about 2 hours per day to work, and is working 8- 10 hours 5 or 6 days a week.   She also works rotating shifts so her sleep is even more impaired.   States that she feels "empty," she is giving all that she has to her job and at the end of the day she has nothing left She denies any risk of self harm- "I love myself"   She just recently started a new job after leaving a job - however this new job is not working out like she hoped and she feels that it is too much. She asked about trying to change or reduce her schedule but was told that this is not possible.      Her son, his GF and their toddler moved into her house.   She loves  them but it is touch having a small child and other people in her home again.   She does not snore or suspect OSA  She did take medication for depression many years ago- she does not really remember what she took in the past but thinks that it stared with an L   Dg Sinuses Complete  Result Date: 02/02/2017 CLINICAL DATA:  Sinus infections off and on x 1 year, h/o of seasonal allergies and sinus infections every spring and fall. Not worse on any side, hurts mainly in face, under eyes. EXAM: PARANASAL SINUSES - COMPLETE 3 + VIEW COMPARISON:  None. FINDINGS: The paranasal sinus are aerated. There is no evidence of sinus opacification air-fluid levels or mucosal thickening. No significant bone abnormalities are seen. Multiple dental restorations. IMPRESSION: Negative. Electronically Signed   By: Lucrezia Europe M.D.   On: 02/02/2017 08:43     Patient Active Problem List   Diagnosis Date Noted  . Dry eyes 10/23/2016  . Obesity 10/23/2016  . RECTAL BLEEDING 12/14/2009    Past Medical History:  Diagnosis Date  . Elevated blood pressure reading   . History of colon polyps  Past Surgical History:  Procedure Laterality Date  . bunyonectomy      Social History  Substance Use Topics  . Smoking status: Never Smoker  . Smokeless tobacco: Never Used  . Alcohol use Yes     Comment: occasionally    Family History  Problem Relation Age of Onset  . Heart disease Other   . Stroke Other   . Hypertension Other     Allergies  Allergen Reactions  . Oxycodone Itching    Medication list has been reviewed and updated.  Current Outpatient Prescriptions on File Prior to Visit  Medication Sig Dispense Refill  . fluticasone (FLONASE) 50 MCG/ACT nasal spray Place 2 sprays into both nostrils daily. 16 g 6  . Ginkgo Biloba (GNP GINGKO BILOBA EXTRACT PO) Take 1 capsule by mouth daily.    . Multiple Vitamin (MULTIVITAMIN) tablet Take 1 tablet by mouth daily.     No current facility-administered  medications on file prior to visit.     Review of Systems:  As per HPI- otherwise negative.   Physical Examination: Vitals:   02/14/17 1518  BP: 124/86  Pulse: 77  Temp: 97.4 F (36.3 C)   Vitals:   02/14/17 1518  Weight: 217 lb (98.4 kg)  Height: 5\' 9"  (1.753 m)   Body mass index is 32.05 kg/m. Ideal Body Weight: Weight in (lb) to have BMI = 25: 168.9  GEN: WDWN, NAD, Non-toxic, A & O x 3, mild overweight, looks well HEENT: Atraumatic, Normocephalic. Neck supple. No masses, No LAD.  Bilateral TM wnl, oropharynx normal.  PEERL,EOMI.   Ears and Nose: No external deformity. CV: RRR, No M/G/R. No JVD. No thrill. No extra heart sounds. PULM: CTA B, no wheezes, crackles, rhonchi. No retractions. No resp. distress. No accessory muscle use. ABD: S, NT, ND, +BS. No rebound. No HSM. EXTR: No c/c/e NEURO Normal gait.  PSYCH: Normally interactive. Conversant. Not depressed or anxious appearing.  Calm demeanor.    Assessment and Plan: Seasonal allergic rhinitis due to pollen - Plan: montelukast (SINGULAIR) 10 MG tablet, Olopatadine HCl 0.2 % SOLN  Exhaustion  Here today to discuss allergies- however it seems that her real issue is being overwhelmed and exhausted.  Discussed ways in which she might be able to work through this.  She declines medication for depression at this time.  She thinks she will end up needing to find a job closer to her home and with less hours- I agree that this sounds like a good plan  She will let me know if she does need any medication for depression. Gave her a note for work for the rest of this week so she can get much needed rest   Will have her try singulair and also pataday eye drops.     Signed Lamar Blinks, MD

## 2017-02-14 NOTE — Telephone Encounter (Signed)
PA initiated via Covermymeds; KEY: [id number]. Received real-time PA approval. Approved through 03/16/2017. PA notification letter sent for scanning.

## 2017-03-07 ENCOUNTER — Ambulatory Visit: Payer: Self-pay | Admitting: Obstetrics & Gynecology

## 2017-03-22 ENCOUNTER — Ambulatory Visit: Payer: Self-pay | Admitting: Obstetrics & Gynecology

## 2017-03-29 ENCOUNTER — Ambulatory Visit: Payer: Self-pay | Admitting: Family Medicine

## 2017-03-29 ENCOUNTER — Telehealth: Payer: Self-pay | Admitting: Family Medicine

## 2017-03-29 DIAGNOSIS — Z0289 Encounter for other administrative examinations: Secondary | ICD-10-CM

## 2017-03-29 NOTE — Telephone Encounter (Signed)
No charge is fine

## 2017-03-29 NOTE — Telephone Encounter (Signed)
Patient lvm at 1:54 cancelling appt 2pm appointment, patient [facility] to Monday 04/02/17, charge or no charge

## 2017-03-31 NOTE — Progress Notes (Deleted)
[place] at [facility] [address], [place], [place] [address] [phone]  Date:  04/02/2017   Name:  Joanne Kim   DOB:  1961/05/19   MRN:  [id number]  PCP:  [name], MD    Chief Complaint: No chief complaint on file.   History of Present Illness:  Joanne Kim is a 56 y.o. very pleasant female patient who presents with the following:  Seen about 6 weeks ago for sinus symptoms and exhaustion related to a long commute to work/ excessive hours/ rotating shift  Patient Active Problem List   Diagnosis Date Noted  . Dry eyes 10/23/2016  . Obesity 10/23/2016  . RECTAL BLEEDING 12/14/2009    Past Medical History:  Diagnosis Date  . Elevated blood pressure reading   . History of colon polyps     Past Surgical History:  Procedure Laterality Date  . bunyonectomy      Social History  Substance Use Topics  . Smoking status: Never Smoker  . Smokeless tobacco: Never Used  . Alcohol use Yes     Comment: occasionally    Family History  Problem Relation Age of Onset  . Heart disease Other   . Stroke Other   . Hypertension Other     Allergies  Allergen Reactions  . Oxycodone Itching    Medication list has been reviewed and updated.  Current Outpatient Prescriptions on File Prior to Visit  Medication Sig Dispense Refill  . fluticasone (FLONASE) 50 MCG/ACT nasal spray Place 2 sprays into both nostrils daily. 16 g 6  . Ginkgo Biloba (GNP GINGKO BILOBA EXTRACT PO) Take 1 capsule by mouth daily.    . montelukast (SINGULAIR) 10 MG tablet Take 1 tablet (10 mg total) by mouth at bedtime. Use as needed for allergies 30 tablet 6  . Multiple Vitamin (MULTIVITAMIN) tablet Take 1 tablet by mouth daily.    . Olopatadine HCl 0.2 % SOLN Use 1 drop in each eye daily for allergies 2.5 mL 6   No current facility-administered medications on file prior to visit.     Review of Systems:  ***  Physical  Examination: There were no vitals filed for this visit. There were no vitals filed for this visit. There is no height or weight on file to calculate BMI. Ideal Body Weight:    ***  Assessment and Plan: ***  Signed Lamar Blinks, MD

## 2017-04-02 ENCOUNTER — Ambulatory Visit: Payer: Self-pay | Admitting: Family Medicine

## 2017-04-02 ENCOUNTER — Telehealth: Payer: Self-pay | Admitting: Family Medicine

## 2017-04-02 NOTE — Telephone Encounter (Signed)
No charge. 

## 2017-04-02 NOTE — Telephone Encounter (Signed)
Patient left message on voicemail at 6:20 A.M. Stating she would not be at appt. Charge or No Charge?

## 2017-04-05 ENCOUNTER — Ambulatory Visit (INDEPENDENT_AMBULATORY_CARE_PROVIDER_SITE_OTHER): Payer: BLUE CROSS/BLUE SHIELD | Admitting: Family Medicine

## 2017-04-05 ENCOUNTER — Encounter: Payer: Self-pay | Admitting: Family Medicine

## 2017-04-05 VITALS — BP 110/70 | HR 60 | Temp 98.3°F | Ht 69.0 in | Wt 221.2 lb

## 2017-04-05 DIAGNOSIS — M545 Low back pain, unspecified: Secondary | ICD-10-CM

## 2017-04-05 DIAGNOSIS — J3489 Other specified disorders of nose and nasal sinuses: Secondary | ICD-10-CM | POA: Diagnosis not present

## 2017-04-05 MED ORDER — CYCLOBENZAPRINE HCL 5 MG PO TABS: 5.0000 mg | ORAL_TABLET | Freq: Three times a day (TID) | ORAL | 0 refills | Status: DC | PRN

## 2017-04-05 NOTE — Progress Notes (Signed)
Musculoskeletal Exam  Patient: Joanne Kim DOB: Jun 23, 1961  DOS: 04/05/2017  SUBJECTIVE:  Chief Complaint:   Chief Complaint  Patient presents with  . Back Pain    lower-x 2 weeks  . Sinus Problem    she feels the Singulair is not working    [name] is a 56 y.o.  female for evaluation and treatment of his back pain.   Onset:  2 weeks ago. Sudden, no related to any injury or change in activity Location: Lower Character:  achy  Progression of issue:  Slightly improved Associated symptoms: none Denies bowel/bladder incontinence or weakness Treatment: to date has been OTC NSAIDS and heat. She has a history of low back pain and has home stretches and exercises. She has been doing them routinely since this started.  Neurovascular symptoms: no  She continues to have sinus problems. She does not feel that the Singulair is working. She continues to have itching watery eyes despite using the [place] drops. She is not used Flonase since the end of April. She continues to use Zyrtec daily. No fevers, cough, or shortness of breath.  ROS: Gastrointestinal: no bowel incontinence Genitourinary: No bladder incontinence Musculoskeletal/Extremities: +back pain Neurologic: no numbness, tingling no weakness   Past Medical History:  Diagnosis Date  . Elevated blood pressure reading   . History of colon polyps    Past Surgical History:  Procedure Laterality Date  . bunyonectomy     Family History  Problem Relation Age of Onset  . Heart disease Other   . Stroke Other   . Hypertension Other    Current Outpatient Prescriptions  Medication Sig Dispense Refill  . fluticasone (FLONASE) 50 MCG/ACT nasal spray Place 2 sprays into both nostrils daily. 16 g 6  . Ginkgo Biloba (GNP GINGKO BILOBA EXTRACT PO) Take 1 capsule by mouth daily.    . montelukast (SINGULAIR) 10 MG tablet Take 1 tablet (10 mg total) by mouth at bedtime. Use as needed for allergies 30 tablet 6  .  Multiple Vitamin (MULTIVITAMIN) tablet Take 1 tablet by mouth daily.    . Olopatadine HCl 0.2 % SOLN Use 1 drop in each eye daily for allergies 2.5 mL 6   Allergies  Allergen Reactions  . Oxycodone Itching   Social History   Social History  . Marital status: Single   Social History Main Topics  . Smoking status: Never Smoker  . Smokeless tobacco: Never Used  . Alcohol use Yes     Comment: occasionally  . Drug use: No  . Sexual activity: Yes    Birth control/ protection: Condom   Objective:  VITAL SIGNS: BP 110/70 (BP Location: Left Arm, Patient Position: Sitting, Cuff Size: Normal)   Pulse 60   Temp 98.3 F (36.8 C) (Oral)   Ht 5\' 9"  (1.753 m)   Wt 221 lb 3.2 oz (100.3 kg)   SpO2 94%   BMI 32.67 kg/m  Constitutional: Well formed, well developed. No acute distress. HENT: Normocephalic, atraumatic.  Moist mucous membranes. Ears negative bilaterally. Nares patent. Eyes:  EOM grossly intact. Conjunctiva normal.  Neck:  Full range of motion.   Cardiovascular: RRR, no murmurs Thorax & Lungs:  CTAB, no wheezing or rales  Extremities: No clubbing. No cyanosis. No edema.  Skin: Warm. Dry. No erythema. No rash.  Musculoskeletal: low back.   Tenderness to palpation: yes Deformity: no Ecchymosis: no Straight leg test: negative for  Poor flexion range of motion of hamstrings and hip bilaterally 5/5 strength  throughout Neurologic: Normal sensory function. No focal deficits noted. DTR's equal and symmetry in LE's. No clonus. Psychiatric: Normal mood. Age appropriate judgment and insight. Alert & oriented x 3.    Assessment:  Acute bilateral low back pain without sciatica - Plan: cyclobenzaprine (FLEXERIL) 5 MG tablet  Sinus pressure  Plan: Orders as above. Don't use Flexeril during the day if it makes drowsy. Continue home stretches and exercises daily. Continue heat. Continue anti-inflammatories. Could add Tylenol if needed. Counseled on diet and exercise. Whenever she  gains weight, she notices it in her back. A healthy diet handout was given. Offered a referral to our obesity specialist, however she declined at this time. I recommended she use Flonase everyday without fail over the next 2 months. If this was not helpful, can consider a failure at that time. Follow-up in 2 months with Dr. Lorelei Pont to recheck her sinus pressure. The patient voiced understanding and agreement to the plan.  Greater than 25 minutes were spent face to face with the patient with greater than 50% of this time spent counseling on treatment for LBP, stretches/exercising, weight loss, eating trends- intermittent fasting, sinus pressure, treatment and follow up.  [place], DO 04/05/17  8:17 AM

## 2017-04-05 NOTE — Patient Instructions (Addendum)
Make sure you use your Flonase daily over the next 2 mo regardless of how you feel. Also continue your other medicines recommended by Dr. Lorelei Pont.  Get back to doing your stretches every day!  Do not take the muscle relaxant during the day if it makes your drowsy.  Take Aleve, 2 tabs daily twice daily as needed for pain.   Healthy Eating Plan Many factors influence your heart health, including eating and exercise habits. Heart (coronary) risk increases with abnormal blood fat (lipid) levels. Heart-healthy meal planning includes limiting unhealthy fats, increasing healthy fats, and making other small dietary changes. This includes maintaining a healthy body weight to help keep lipid levels within a normal range.  WHAT IS MY PLAN?  Your health care provider recommends that you:  Drink a glass of water before meals to help with satiety.  Eat slowly.  An alternative to the water is to add Metamucil. This will help with satiety as well. It does contain calories, unlike water.  WHAT TYPES OF FAT SHOULD I CHOOSE?  Choose healthy fats more often. Choose monounsaturated and polyunsaturated fats, such as olive oil and canola oil, flaxseeds, walnuts, almonds, and seeds.  Eat more omega-3 fats. Good choices include salmon, mackerel, sardines, tuna, flaxseed oil, and ground flaxseeds. Aim to eat fish at least two times each week.  Avoid foods with partially hydrogenated oils in them. These contain trans fats. Examples of foods that contain trans fats are stick margarine, some tub margarines, cookies, crackers, and other baked goods. If you are going to avoid a fat, this is the one to avoid!  WHAT GENERAL GUIDELINES DO I NEED TO FOLLOW?  Check food labels carefully to identify foods with trans fats. Avoid these types of options when possible.  Fill one half of your plate with vegetables and green salads. Eat 4-5 servings of vegetables per day. A serving of vegetables equals 1 cup of raw leafy  vegetables,  cup of raw or cooked cut-up vegetables, or  cup of vegetable juice.  Fill one fourth of your plate with whole grains. Look for the word "whole" as the first word in the ingredient list.  Fill one fourth of your plate with lean protein foods.  Eat 4-5 servings of fruit per day. A serving of fruit equals one medium whole fruit,  cup of dried fruit,  cup of fresh, frozen, or canned fruit. Try to avoid fruits in cups/syrups as the sugar content can be high.  Eat more foods that contain soluble fiber. Examples of foods that contain this type of fiber are apples, broccoli, carrots, beans, peas, and barley. Aim to get 20-30 g of fiber per day.  Eat more home-cooked food and less restaurant, buffet, and fast food.  Limit or avoid alcohol.  Limit foods that are high in starch and sugar.  Avoid fried foods when able.  Cook foods by using methods other than frying. Baking, boiling, grilling, and broiling are all great options. Other fat-reducing suggestions include: ? Removing the skin from poultry. ? Removing all visible fats from meats. ? Skimming the fat off of stews, soups, and gravies before serving them. ? Steaming vegetables in water or broth.  Lose weight if you are overweight. Losing just 5-10% of your initial body weight can help your overall health and prevent diseases such as diabetes and heart disease.  Increase your consumption of nuts, legumes, and seeds to 4-5 servings per week. One serving of dried beans or legumes equals  cup after  being cooked, one serving of nuts equals 1 ounces, and one serving of seeds equals  ounce or 1 tablespoon.  WHAT ARE GOOD FOODS CAN I EAT? Grains Grainy breads (try to find bread that is 3 g of fiber per slice or greater), oatmeal, light popcorn. Whole-grain cereals. Rice and pasta, including brown rice and those that are made with whole wheat. Edamame pasta is a great alternative to grain pasta. It has a higher protein content. Try  to avoid significant consumption of white bread, sugary cereals, or pastries/baked goods.  Vegetables All vegetables. Cooked white potatoes do not count as vegetables.  Fruits All fruits, but limit pineapple and bananas as these fruits have a higher sugar content.  Meats and Other Protein Sources Lean, well-trimmed beef, veal, pork, and lamb. Chicken and [place] without skin. All fish and shellfish. Wild duck, rabbit, pheasant, and venison. Egg whites or low-cholesterol egg substitutes. Dried beans, peas, lentils, and tofu.Seeds and most nuts.  Dairy Low-fat or nonfat cheeses, including ricotta, string, and mozzarella. Skim or 1% milk that is liquid, powdered, or evaporated. Buttermilk that is made with low-fat milk. Nonfat or low-fat yogurt. Soy/Almond milk are good alternatives if you cannot handle dairy.  Beverages Water is the best for you. Sports drinks with less sugar are more desirable unless you are a highly active athlete.  Sweets and Desserts Sherbets and fruit ices. Honey, jam, marmalade, jelly, and syrups. Dark chocolate.  Eat all sweets and desserts in moderation.  Fats and Oils Nonhydrogenated (trans-free) margarines. Vegetable oils, including soybean, sesame, sunflower, olive, peanut, safflower, corn, canola, and cottonseed. Salad dressings or mayonnaise that are made with a vegetable oil. Limit added fats and oils that you use for cooking, baking, salads, and as spreads.  Other Cocoa powder. Coffee and tea. Most condiments.  The items listed above may not be a complete list of recommended foods or beverages. Contact your dietitian for more options.

## 2017-04-12 ENCOUNTER — Ambulatory Visit: Payer: Self-pay | Admitting: Obstetrics & Gynecology

## 2017-04-23 ENCOUNTER — Telehealth: Payer: Self-pay

## 2017-04-23 NOTE — Telephone Encounter (Signed)
PA initiated via Covermymeds; KEY: [id number]. Awaiting determination.

## 2017-04-24 NOTE — Telephone Encounter (Signed)
PA denied.

## 2017-04-25 NOTE — Telephone Encounter (Signed)
Called and [facility] Insurance will not cover her pataday She can pay cash, or she might try looking online for a discount voucher Let me know if I can help

## 2017-05-07 ENCOUNTER — Ambulatory Visit (INDEPENDENT_AMBULATORY_CARE_PROVIDER_SITE_OTHER): Payer: BLUE CROSS/BLUE SHIELD | Admitting: Obstetrics & Gynecology

## 2017-05-07 ENCOUNTER — Encounter: Payer: Self-pay | Admitting: Obstetrics & Gynecology

## 2017-05-07 VITALS — BP 134/82 | HR 72 | Ht 68.5 in | Wt 219.0 lb

## 2017-05-07 DIAGNOSIS — R87619 Unspecified abnormal cytological findings in specimens from cervix uteri: Secondary | ICD-10-CM

## 2017-05-07 DIAGNOSIS — Z124 Encounter for screening for malignant neoplasm of cervix: Secondary | ICD-10-CM

## 2017-05-07 DIAGNOSIS — Z01419 Encounter for gynecological examination (general) (routine) without abnormal findings: Secondary | ICD-10-CM | POA: Diagnosis not present

## 2017-05-07 DIAGNOSIS — Z1151 Encounter for screening for human papillomavirus (HPV): Secondary | ICD-10-CM | POA: Diagnosis not present

## 2017-05-07 DIAGNOSIS — R928 Other abnormal and inconclusive findings on diagnostic imaging of breast: Secondary | ICD-10-CM

## 2017-05-07 NOTE — Progress Notes (Signed)
Subjective:     Joanne Kim is a 56 y.o. female here for a routine exam.  LMP 09/2016 Current complaints: Pt for annual GYN exam. Pt has her last PAP at the [facility] in [place].   Pt c/o occ hot flushes. Not severe. Pt is not content in her current job but, is looking to switch positions.    Gynecologic History Patient's last menstrual period was 09/11/2016. Contraception: post menopausal status Last Pap: 04/2017. Results were: abnormal- does not know what it ws but, she had 'abnormal cells' she was told that they would 'check on it' '+hrHPV' Last mammogram: 11/2015. Results were: abnormal: birad 3  Obstetric History OB History  Gravida Para Term Preterm AB Living  3 1 1   2     SAB TAB Ectopic Multiple Live Births    2     1    # Outcome Date GA Lbr Len/2nd Weight Sex Delivery Anes PTL Lv  3 TAB           2 TAB           1 Term      Vag-Spont          The following portions of the patient's history were reviewed and updated as appropriate: allergies, current medications, past family history, past medical history, past social history, past surgical history and problem list.  Review of Systems Pertinent items are noted in HPI.    Objective:  BP 134/82 (BP Location: Left Arm)   Pulse 72   Ht 5' 8.5" (1.74 m)   Wt 219 lb (99.3 kg)   LMP 09/11/2016   BMI 32.81 kg/m  General Appearance:    Alert, cooperative, no distress, appears stated age  Head:    Normocephalic, without obvious abnormality, atraumatic  Eyes:    conjunctiva/corneas clear, EOM's intact, both eyes  Ears:    Normal external ear canals, both ears  Nose:   Nares normal, septum midline, mucosa normal, no drainage    or sinus tenderness  Throat:   Lips, mucosa, and tongue normal; teeth and gums normal  Neck:   Supple, symmetrical, trachea midline, no adenopathy;    thyroid:  no enlargement/tenderness/nodules  Back:     Symmetric, no curvature, ROM normal, no CVA tenderness  Lungs:     Clear to auscultation  bilaterally, respirations unlabored  Chest Wall:    No tenderness or deformity   Heart:    Regular rate and rhythm, S1 and S2 normal, no murmur, rub   or gallop  Breast Exam:    No tenderness, masses, or nipple abnormality  Abdomen:     Soft, non-tender, bowel sounds active all four quadrants,    no masses, no organomegaly  Genitalia:    Normal female without lesion, discharge or tenderness     Extremities:   Extremities normal, atraumatic, no cyanosis or edema  Pulses:   2+ and symmetric all extremities  Skin:   Skin color, texture, turgor normal, no rashes or lesions- there are hypopigmented areas throughout.        Assessment:    Healthy female exam.   Prev abnormal PAP this is her f/u visit Prev abnormal mammogram with no f/u      Plan:  Diagnostic mammogram and f/u right breast US  Mammogram ordered. Follow up in: 1 year.     [name] L. Harraway-Smith, M.D., Cherlynn June

## 2017-05-07 NOTE — Patient Instructions (Signed)
Perimenopause Perimenopause is the time when your body begins to move into the menopause (no menstrual period for 12 straight months). It is a natural process. Perimenopause can begin 2-8 years before the menopause and usually lasts for 1 year after the menopause. During this time, your ovaries may or may not produce an egg. The ovaries vary in their production of estrogen and progesterone hormones each month. This can cause irregular menstrual periods, difficulty getting pregnant, vaginal bleeding between periods, and uncomfortable symptoms. What are the causes?  Irregular production of the ovarian hormones, estrogen and progesterone, and not ovulating every month. Other causes include:  Tumor of the pituitary gland in the brain.  Medical disease that affects the ovaries.  Radiation treatment.  Chemotherapy.  Unknown causes.  Heavy smoking and excessive alcohol intake can bring on perimenopause sooner. What are the signs or symptoms?  Hot flashes.  Night sweats.  Irregular menstrual periods.  Decreased sex drive.  Vaginal dryness.  Headaches.  Mood swings.  Depression.  Memory problems.  Irritability.  Tiredness.  Weight gain.  Trouble getting pregnant.  The beginning of losing bone cells (osteoporosis).  The beginning of hardening of the arteries (atherosclerosis). How is this diagnosed? Your health care provider will make a diagnosis by analyzing your age, menstrual history, and symptoms. He or she will do a physical exam and note any changes in your body, especially your female organs. Female hormone tests may or may not be helpful depending on the amount of female hormones you produce and when you produce them. However, other hormone tests may be helpful to rule out other problems. How is this treated? In some cases, no treatment is needed. The decision on whether treatment is necessary during the perimenopause should be made by you and your health care  provider based on how the symptoms are affecting you and your lifestyle. Various treatments are available, such as:  Treating individual symptoms with a specific medicine for that symptom.  Herbal medicines that can help specific symptoms.  Counseling.  Group therapy. Follow these instructions at home:  Keep track of your menstrual periods (when they occur, how heavy they are, how long between periods, and how long they last) as well as your symptoms and when they started.  Only take over-the-counter or prescription medicines as directed by your health care provider.  Sleep and rest.  Exercise.  Eat a diet that contains calcium (good for your bones) and soy (acts like the estrogen hormone).  Do not smoke.  Avoid alcoholic beverages.  Take vitamin supplements as recommended by your health care provider. Taking vitamin E may help in certain cases.  Take calcium and vitamin D supplements to help prevent bone loss.  Group therapy is sometimes helpful.  Acupuncture may help in some cases. Contact a health care provider if:  You have questions about any symptoms you are having.  You need a referral to a specialist (gynecologist, psychiatrist, or psychologist). Get help right away if:  You have vaginal bleeding.  Your period lasts longer than 8 days.  Your periods are recurring sooner than 21 days.  You have bleeding after intercourse.  You have severe depression.  You have pain when you urinate.  You have severe headaches.  You have vision problems. This information is not intended to replace advice given to you by your health care provider. Make sure you discuss any questions you have with your health care provider. Document Released: 11/02/2004 Document Revised: 03/02/2016 Document Reviewed: 04/24/2013 Elsevier Interactive   Patient Education  2017 Elsevier Inc.  

## 2017-05-08 ENCOUNTER — Other Ambulatory Visit: Payer: Self-pay | Admitting: Obstetrics & Gynecology

## 2017-05-08 DIAGNOSIS — M7989 Other specified soft tissue disorders: Secondary | ICD-10-CM

## 2017-05-09 LAB — CYTOLOGY - PAP
DIAGNOSIS: NEGATIVE
HPV (WINDOPATH): DETECTED — AB

## 2017-05-15 ENCOUNTER — Ambulatory Visit
Admission: RE | Admit: 2017-05-15 | Discharge: 2017-05-15 | Disposition: A | Discharge status: Home / Self Care | Payer: BLUE CROSS/BLUE SHIELD | Source: Ambulatory Visit | Attending: Obstetrics & Gynecology | Admitting: Obstetrics & Gynecology

## 2017-05-15 ENCOUNTER — Telehealth: Payer: Self-pay

## 2017-05-15 DIAGNOSIS — M7989 Other specified soft tissue disorders: Secondary | ICD-10-CM

## 2017-05-15 DIAGNOSIS — R928 Other abnormal and inconclusive findings on diagnostic imaging of breast: Secondary | ICD-10-CM | POA: Diagnosis not present

## 2017-05-15 DIAGNOSIS — N6489 Other specified disorders of breast: Secondary | ICD-10-CM | POA: Diagnosis not present

## 2017-05-15 NOTE — Telephone Encounter (Signed)
Unable to leave message for patient - "voicemailbox is full unable to accept messages at this time". Kathrene Alu RNBSN

## 2017-05-15 NOTE — Telephone Encounter (Signed)
-----   Message from Lavonia Drafts, MD sent at 05/14/2017  7:21 PM EDT ----- Please call pt. She will need a repeat PAP in 1 year for a normal PAP with +hrHPV.  Thx, clh-S

## 2017-05-18 NOTE — Telephone Encounter (Signed)
Attempted to reach patient again. Voicemailbox is full. Will send letter to patient. Kathrene Alu RNBSN

## 2017-06-05 ENCOUNTER — Ambulatory Visit (INDEPENDENT_AMBULATORY_CARE_PROVIDER_SITE_OTHER): Payer: BLUE CROSS/BLUE SHIELD | Admitting: Medical

## 2017-06-05 ENCOUNTER — Encounter: Payer: Self-pay | Admitting: Medical

## 2017-06-05 VITALS — BP 120/84 | HR 69 | Temp 98.6°F | Resp 14 | Ht 69.0 in | Wt 218.0 lb

## 2017-06-05 DIAGNOSIS — J329 Chronic sinusitis, unspecified: Secondary | ICD-10-CM | POA: Diagnosis not present

## 2017-06-05 DIAGNOSIS — J301 Allergic rhinitis due to pollen: Secondary | ICD-10-CM

## 2017-06-05 MED ORDER — PREDNISONE 10 MG PO TABS: ORAL_TABLET | ORAL | 0 refills | Status: DC

## 2017-06-05 MED ORDER — AZELASTINE HCL 0.1 % NA SOLN: 2.0000 | Freq: Two times a day (BID) | NASAL | 3 refills | Status: DC

## 2017-06-05 MED ORDER — FLUTICASONE PROPIONATE 50 MCG/ACT NA SUSP: 2.0000 | Freq: Every day | NASAL | 1 refills | Status: DC

## 2017-06-05 MED ORDER — CEFDINIR 300 MG PO CAPS: 300.0000 mg | ORAL_CAPSULE | Freq: Two times a day (BID) | ORAL | 0 refills | Status: DC

## 2017-06-05 NOTE — Patient Instructions (Signed)
For allergic rhinitis continue zyrtec and singulair. Recommend flonase nasal spray over the counter and astelin nasal spray. Ok to use both.  For sinus infection cefdnir antibiotic.  For any persisting/lingering allergy symptoms can use tapered prednisone. Rx given.  Follow up in 7 days or as needed

## 2017-06-05 NOTE — Progress Notes (Signed)
Pre visit review using our clinic review tool, if applicable. No additional management support is needed unless otherwise documented below in the visit note. 

## 2017-06-05 NOTE — Progress Notes (Signed)
Subjective:    Patient ID: Joanne Kim, female    DO: 1961/08/06, 56 y.o.   MRM: [id number]  HPI  Pt in for sinus pressure, nasal congestion, runny nose, pend and mild ha. Pt states was outside the other day when symptoms started a week ago. She states started to itch but no rash. She states has history of allergic rhinitis. She went home and took benadryl. Next day took singulair and zyrtec.   She states suffered with nasal and sinus symptoms over past year. She works at [employer]. She states the building has smell.   Her sinuses started to hurt again after nasal congestion started from above.  Pt jobs is about to change her job.    Review of Systems  Constitutional: Negative for chills, fatigue and fever.  HENT: Positive for congestion, postnasal drip, rhinorrhea, sinus pain and sinus pressure. Negative for sore throat.   Respiratory: Negative for cough, choking, chest tightness, shortness of breath and wheezing.   Cardiovascular: Negative for chest pain and palpitations.  Gastrointestinal: Negative for abdominal pain.  Musculoskeletal: Negative for back pain.       Faint achiness.  Skin: Negative for rash.       Skin itching taper off.  Hematological: Negative for adenopathy. Does not bruise/bleed easily.  Psychiatric/Behavioral: Negative for behavioral problems.   Past Medical History:  Diagnosis Date  . Elevated blood pressure reading   . History of colon polyps   . Vaginal Pap smear, abnormal 2017     Social History   Social History  . Marital status: Single    Spouse name: N/A  . Number of children: N/A  . Years of education: N/A   Occupational History  . Not on file.   Social History Main Topics  . Smoking status: Never Smoker  . Smokeless tobacco: Never Used  . Alcohol use Yes     Comment: occasionally  . Drug use: No  . Sexual activity: Yes    Birth control/ protection: Condom   Other Topics Concern  . Not on file   Social  History Narrative  . No narrative on file    Past Surgical History:  Procedure Laterality Date  . bunyonectomy      Family History  Problem Relation Age of Onset  . Heart disease Other   . Stroke Other   . Hypertension Other   . Breast cancer Maternal Aunt     Allergies  Allergen Reactions  . Oxycodone Itching    Current Outpatient Prescriptions on File Prior to Visit  Medication Sig Dispense Refill  . montelukast (SINGULAIR) 10 MG tablet Take 1 tablet (10 mg total) by mouth at bedtime. Use as needed for allergies 30 tablet 6  . Multiple Vitamin (MULTIVITAMIN) tablet Take 1 tablet by mouth daily.    . fluticasone (FLONASE) 50 MCG/ACT nasal spray Place 2 sprays into both nostrils daily. (Patient not taking: Reported on 05/07/2017) 16 g 6  . Ginkgo Biloba (GNP GINGKO BILOBA EXTRACT PO) Take 1 capsule by mouth daily.     No current facility-administered medications on file prior to visit.     BP 120/84   Pulse 69   Temp 98.6 F (37 C) (Oral)   Resp 14   Ht 5\' 9"  (1.753 m)   Wt 218 lb (98.9 kg)   SpO2 96%   BMI 32.19 kg/m      Objective:   Physical Exam   General  Mental Status - Alert. General Appearance -  Well groomed. Not in acute distress.  Skin Rashes- No Rashes.  HEENT Head- Normal. Ear Auditory Canal - Left- Normal. Right - Normal.Tympanic Membrane- Left- Normal. Right- Normal. Eye Sclera/Conjunctiva- Left- Normal. Right- Normal. Nose & Sinuses Nasal Mucosa- Left-  Boggy and Congested. Right-  Boggy and  Congested.Bilateral moderate maxillary but faint frontal sinus pressure. Mouth & Throat Lips: Upper Lip- Normal: no dryness, cracking, pallor, cyanosis, or vesicular eruption. Lower Lip-Normal: no dryness, cracking, pallor, cyanosis or vesicular eruption. Buccal Mucosa- Bilateral- No Aphthous ulcers. Oropharynx- No Discharge or Erythema. Tonsils: Characteristics- Bilateral- No Erythema or Congestion. Size/Enlargement- Bilateral- No enlargement.  Discharge- bilateral-None.  Neck Neck- Supple. No Masses.   Chest and Lung Exam Auscultation: Breath Sounds:-Clear even and unlabored.  Cardiovascular Auscultation:Rythm- Regular, rate and rhythm. Murmurs & Other Heart Sounds:Ausculatation of the heart reveal- No Murmurs.  Lymphatic Head & Neck General Head & Neck Lymphatics: Bilateral: Description- No Localized lymphadenopathy.      Assessment & Plan:  For allergic rhinitis continue zyrtec and singulair. Recommend flonase nasal spray over the counter and astelin nasal spray. Ok to use both.  For sinus infection cefdnir antibiotic.  For any persisting/lingering allergy symptoms can use tapered prednisone. Rx given.  Follow up in 7 days or as needed

## 2017-06-12 ENCOUNTER — Telehealth: Payer: Self-pay | Admitting: Family Medicine

## 2017-06-12 NOTE — Telephone Encounter (Signed)
Pt was seen and given a antibiotic. She said that she now have a yeast infection, pt would like to have something called in to pharmacy.    Pharmacy: [id number] [place], [place]

## 2017-06-12 NOTE — Telephone Encounter (Signed)
Diflucan 150 mg#2   1 po qd, x1 may repeat in 3 days prn

## 2017-06-13 NOTE — Telephone Encounter (Signed)
Patient notified

## 2017-06-13 NOTE — Telephone Encounter (Signed)
Patient checking the status of message below, please advise  [id number] - [place], [place] [phone] (Phone) [phone] (Fax)

## 2017-06-18 ENCOUNTER — Telehealth: Payer: Self-pay

## 2017-06-18 ENCOUNTER — Other Ambulatory Visit: Payer: Self-pay

## 2017-06-18 MED ORDER — PREDNISONE 10 MG PO TABS: ORAL_TABLET | ORAL | 0 refills | Status: DC

## 2017-06-18 NOTE — Telephone Encounter (Signed)
Almost a 2 weeks since I saw pt. You can refill the prednisone as I wrote last. It will usually print. Please give to me and then I will sign.   Since 2 weeks has already passed if she uses and not getting better then please be seen.

## 2017-06-18 NOTE — Telephone Encounter (Signed)
Patient states she was given a Prednison Rx on 8/28 but has since lost it. Would like to know if she could have another for her sinus symptoms. Please advise.

## 2017-06-18 NOTE — Telephone Encounter (Signed)
Rx printed for patient. Called patient to check on pharmacy she wants Rx faxed to. Left message for return call.

## 2017-06-18 NOTE — Telephone Encounter (Signed)
Pt called states she already picked up prednisone script yesterday. She said it was sent to pharmacy previously but pharm was closed Saturday so she couldn't pick it up until yesterday. Pt has her script. Please do not send another one.

## 2017-07-11 ENCOUNTER — Telehealth: Payer: Self-pay

## 2017-07-11 NOTE — Telephone Encounter (Signed)
PA initiated via Covermymeds; KEY: [id number]. Received PA approval effective 07/11/2017 through 08/11/2017.

## 2017-10-27 NOTE — Progress Notes (Deleted)
[place] at [facility] [address] [place], [place] [address] [phone]  Date:  10/29/2017   Name:  Joanne Kim   DOB:  01/17/1961   MRN:  [id number]  PCP:  [name], MD    Chief Complaint: No chief complaint on file.   History of Present Illness:  Joanne Kim is a 57 y.o. very pleasant female patient who presents with the following:  Here today for a sick visit-  From our last visit in 5/18 Admits that her life is kind of overwhelming right now.  She is feeling somewhat down, and is having some anxiety "for the first time in my life."  She is driving about 2 hours per day to work, and is working 8- 10 hours 5 or 6 days a week.   She also works rotating shifts so her sleep is even more impaired.   States that she feels "empty," she is giving all that she has to her job and at the end of the day she has nothing left She denies any risk of self harm- "I love myself"  She just recently started a new job after leaving a job - however this new job is not working out like she hoped and she feels that it is too much. She asked about trying to change or reduce her schedule but was told that this is not possible.     Her son, his GF and their toddler moved into her house.   She loves them but it is touch having a small child and other people in her home again.   Flu:  Patient Active Problem List   Diagnosis Date Noted  . Dry eyes 10/23/2016  . Obesity 10/23/2016  . RECTAL BLEEDING 12/14/2009    Past Medical History:  Diagnosis Date  . Elevated blood pressure reading   . History of colon polyps   . Vaginal Pap smear, abnormal 2017    Past Surgical History:  Procedure Laterality Date  . bunyonectomy      Social History   Tobacco Use  . Smoking status: Never Smoker  . Smokeless tobacco: Never Used  Substance Use Topics  . Alcohol use: Yes    Comment: occasionally  . Drug use: No    Family  History  Problem Relation Age of Onset  . Heart disease Other   . Stroke Other   . Hypertension Other   . Breast cancer Maternal Aunt     Allergies  Allergen Reactions  . Oxycodone Itching    Medication list has been reviewed and updated.  Current Outpatient Medications on File Prior to Visit  Medication Sig Dispense Refill  . azelastine (ASTELIN) 0.1 % nasal spray Place 2 sprays into both nostrils 2 (two) times daily. Use in each nostril as directed 30 mL 3  . cefdinir (OMNICEF) 300 MG capsule Take 1 capsule (300 mg total) by mouth 2 (two) times daily. 20 capsule 0  . fluticasone (FLONASE) 50 MCG/ACT nasal spray Place 2 sprays into both nostrils daily. 16 g 1  . Ginkgo Biloba (GNP GINGKO BILOBA EXTRACT PO) Take 1 capsule by mouth daily.    . montelukast (SINGULAIR) 10 MG tablet Take 1 tablet (10 mg total) by mouth at bedtime. Use as needed for allergies 30 tablet 6  . Multiple Vitamin (MULTIVITAMIN) tablet Take 1 tablet by mouth daily.    . predniSONE (DELTASONE) 10 MG tablet 5 TAB PO  DAY 1 4 TAB PO DAY 2 3 TAB PO DAY 3 2 TAB PO DAY 4 1 TAB PO DAY 5 15 tablet 0   No current facility-administered medications on file prior to visit.     Review of Systems:  As per HPI- otherwise negative.   Physical Examination: There were no vitals filed for this visit. There were no vitals filed for this visit. There is no height or weight on file to calculate BMI. Ideal Body Weight:    GEN: WDWN, NAD, Non-toxic, A & O x 3 HEENT: Atraumatic, Normocephalic. Neck supple. No masses, No LAD. Ears and Nose: No external deformity. CV: RRR, No M/G/R. No JVD. No thrill. No extra heart sounds. PULM: CTA B, no wheezes, crackles, rhonchi. No retractions. No resp. distress. No accessory muscle use. ABD: S, NT, ND, +BS. No rebound. No HSM. EXTR: No c/c/e NEURO Normal gait.  PSYCH: Normally interactive. Conversant. Not depressed or anxious appearing.  Calm demeanor.    Assessment and  Plan: ***  Signed Lamar Blinks, MD

## 2017-10-29 ENCOUNTER — Ambulatory Visit: Payer: BLUE CROSS/BLUE SHIELD | Admitting: Family Medicine

## 2017-10-31 NOTE — Progress Notes (Signed)
[place] at [facility] [address] [place], [place] [address] [phone]  Date:  11/01/2017   Name:  Joanne Kim   DOB:  October 19, 1960   MRN:  [id number]  PCP:  [name], MD    Chief Complaint: Anxiety (c/o of anxiety. ); Sinus Problem (c/o sinus headache, and dry eyes. ); and Medication Refill (Refill needed on Azelastine nsal spray. )   History of Present Illness:  Joanne Kim is a 57 y.o. 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   This has been a big stress for her She has not had this sort of mood problem in the past, she has always been pretty even keeled  Her sleep is not great sometimes, and generally she is a good sleeper Her energy level has been up and down, but not terrible generally  She is feeling sad, down, overwhelmed, crying spells are present.  She is able to bounce back and carry on with her day She notes that she has had these sx for the last several months No SI  Her son, his GF and her 41 yo GD are living in her home right now,"this is driving me literally crazy."  Her menses stopped about 9 months ago She has noted some hot flashes but not too bad.  They are not really bothering her, she does not think that her sx are really due to menopause   She last had labs about one year ago  Her eyes have been dry and red for quite some time- off an on for the last 6-8 months We gave her some pataday last year that did help, she would like a refill She wears glasses mostly but does have contacts.   Also needs a refill of her astelin spray  Patient Active Problem List   Diagnosis Date Noted  . Dry eyes 10/23/2016  .  Obesity 10/23/2016  . RECTAL BLEEDING 12/14/2009    Past Medical History:  Diagnosis Date  . Elevated blood pressure reading   . History of colon polyps   . Vaginal Pap smear, abnormal 2017    Past Surgical History:  Procedure Laterality Date  . bunyonectomy      Social History   Tobacco Use  . Smoking status: Never Smoker  . Smokeless tobacco: Never Used  Substance Use Topics  . Alcohol use: Yes    Comment: occasionally  . Drug use: No    Family History  Problem Relation Age of Onset  . Heart disease Other   . Stroke Other   . Hypertension Other   . Breast cancer Maternal Aunt     Allergies  Allergen Reactions  . Oxycodone Itching    Medication list has been reviewed and updated.  Current Outpatient Medications on File Prior to Visit  Medication Sig Dispense Refill  . azelastine (ASTELIN) 0.1 % nasal spray Place 2 sprays into both nostrils 2 (two) times daily. Use in each nostril as directed 30 mL 3  . fluticasone (FLONASE) 50 MCG/ACT nasal spray Place 2 sprays into both nostrils daily. 16 g 1  . montelukast (SINGULAIR) 10 MG tablet Take 1  tablet (10 mg total) by mouth at bedtime. Use as needed for allergies 30 tablet 6  . Multiple Vitamin (MULTIVITAMIN) tablet Take 1 tablet by mouth daily.     No current facility-administered medications on file prior to visit.     Review of Systems:  As per HPI- otherwise negative. Wt Readings from Last 3 Encounters:  11/01/17 217 lb 3.2 oz (98.5 kg)  06/05/17 218 lb (98.9 kg)  05/07/17 219 lb (99.3 kg)   Would like to lose weight but is not really trying as of yet No fever or chills No CP or SOB    Physical Examination: Vitals:   11/01/17 1551  BP: 138/88  Pulse: 82  Temp: 98.5 F (36.9 C)  SpO2: 97%   Vitals:   11/01/17 1551  Weight: 217 lb 3.2 oz (98.5 kg)  Height: 5\' 9"  (1.753 m)   Body mass index is 32.07 kg/m. Ideal Body Weight: Weight in (lb) to have BMI = 25: 168.9  GEN: WDWN, NAD,  Non-toxic, A & O x 3, obese, otherwise looks well  HEENT: Atraumatic, Normocephalic. Neck supple. No masses, No LAD.  Bilateral TM wnl, oropharynx normal.  PEERL,EOMI.   Ears and Nose: No external deformity. CV: RRR, No M/G/R. No JVD. No thrill. No extra heart sounds. PULM: CTA B, no wheezes, crackles, rhonchi. No retractions. No resp. distress. No accessory muscle use. ABD: S, NT, ND, +BS. No rebound. No HSM. EXTR: No c/c/e NEURO Normal gait.  PSYCH: Normally interactive. Conversant. Not depressed or anxious appearing.  Calm demeanor.    Assessment and Plan: Adjustment disorder with mixed anxiety and depressed mood - Plan: FLUoxetine (PROZAC) 20 MG tablet  Dry eyes - Plan: Olopatadine HCl 0.2 % SOLN  Sinus congestion - Plan: azelastine (ASTELIN) 0.1 % nasal spray  Here today to discuss depression/ adjustment disorder.  However on discussion she is not sure if she is willing to try a medication after all.  She did accept an rx to take with her and she will think about this. She denies any danger of self harm and may try to manage her sx with exercise, meditation, etc She will let me know if not doing ok Refilled her astelin and pataday drops   Signed Lamar Blinks, MD

## 2017-11-01 ENCOUNTER — Encounter: Payer: Self-pay | Admitting: Family Medicine

## 2017-11-01 ENCOUNTER — Ambulatory Visit (INDEPENDENT_AMBULATORY_CARE_PROVIDER_SITE_OTHER): Payer: BLUE CROSS/BLUE SHIELD | Admitting: Family Medicine

## 2017-11-01 VITALS — BP 138/88 | HR 82 | Temp 98.5°F | Ht 69.0 in | Wt 217.2 lb

## 2017-11-01 DIAGNOSIS — F4323 Adjustment disorder with mixed anxiety and depressed mood: Secondary | ICD-10-CM | POA: Diagnosis not present

## 2017-11-01 DIAGNOSIS — R0981 Nasal congestion: Secondary | ICD-10-CM | POA: Diagnosis not present

## 2017-11-01 DIAGNOSIS — H04123 Dry eye syndrome of bilateral lacrimal glands: Secondary | ICD-10-CM | POA: Diagnosis not present

## 2017-11-01 MED ORDER — AZELASTINE HCL 0.1 % NA SOLN: 2.0000 | Freq: Two times a day (BID) | NASAL | 3 refills | Status: DC

## 2017-11-01 MED ORDER — OLOPATADINE HCL 0.2 % OP SOLN: OPHTHALMIC | 9 refills | Status: DC

## 2017-11-01 MED ORDER — FLUOXETINE HCL 20 MG PO TABS: 20.0000 mg | ORAL_TABLET | Freq: Every day | ORAL | 5 refills | Status: DC

## 2017-11-01 NOTE — Patient Instructions (Addendum)
It was good to see you today-  I am sorry that you are having a hard time right now, but I hope that things get better for you I will give you an rx for fluoxetine 20 mg to take if you like- if you do end up taking this please message me 2-3 weeks after start with an update   We refilled your astelin spray for you today For dry eyes we will use the rx eye drops again.  However, I would also recommend that you see your eye doctor about this  Please let me know if you are not doing ok!

## 2017-12-24 ENCOUNTER — Other Ambulatory Visit: Payer: Self-pay | Admitting: *Deleted

## 2017-12-24 ENCOUNTER — Telehealth: Payer: Self-pay | Admitting: Family Medicine

## 2017-12-24 DIAGNOSIS — J301 Allergic rhinitis due to pollen: Secondary | ICD-10-CM

## 2017-12-24 MED ORDER — MONTELUKAST SODIUM 10 MG PO TABS: 10.0000 mg | ORAL_TABLET | Freq: Every day | ORAL | 6 refills | Status: DC

## 2017-12-24 NOTE — Telephone Encounter (Signed)
Patient should have refills of her Astelin and eyedrops should be on file. Singular sent to pharmacy per protocol.( LOV addressed 8/18)

## 2017-12-24 NOTE — Telephone Encounter (Unsigned)
Copied from [place] [id number]. Topic: Quick Communication - Rx Refill/Question >> Dec 24, 2017 10:09 AM Carolyn Stare wrote: Medication                  azelastine (ASTELIN) 0.1 % nasal spray             FILLED ON 10/28/17   PT DID NOT PICK UP     Montelukast (SINGULAIR) 10 MG tablet    Olopatadine HCl 0.2 % SOLN                        FILLED ON 10/28/17 PT DID NOT PICK UP   [facility]   [place]         (Agent: If no, request that the patient contact the pharmacy for the refill.)   Preferred Pharmacy (with phone number or street name): ***   Agent: Please be advised that RX refills may take up to 3 business days. We ask that you follow-up with your pharmacy.

## 2017-12-24 NOTE — Telephone Encounter (Unsigned)
Copied from [place] [id number]. Topic: Quick Communication - Rx Refill/Question >> Dec 24, 2017 10:15 AM Carolyn Stare wrote: Medication        These 2 were refilled on 10/28/17   pt never picked them up   azelastine (ASTELIN) 0.1 % nasal spray,      Olopatadine HCl 0.2 % SOLN  Pt also req refill on  montelukast (SINGULAIR) 10 MG tablet   t   Preferred [facility] [place]    Agent: Please be advised that RX refills may take up to 3 business days. We ask that you follow-up with your pharmacy.

## 2018-01-18 ENCOUNTER — Ambulatory Visit: Payer: BLUE CROSS/BLUE SHIELD | Admitting: Obstetrics & Gynecology

## 2018-02-13 ENCOUNTER — Ambulatory Visit: Payer: BLUE CROSS/BLUE SHIELD | Admitting: Obstetrics & Gynecology

## 2018-02-13 ENCOUNTER — Encounter: Payer: Self-pay | Admitting: Obstetrics & Gynecology

## 2018-02-13 ENCOUNTER — Ambulatory Visit (INDEPENDENT_AMBULATORY_CARE_PROVIDER_SITE_OTHER): Payer: BLUE CROSS/BLUE SHIELD | Admitting: Obstetrics & Gynecology

## 2018-02-13 VITALS — BP 132/87 | HR 76 | Ht 69.0 in | Wt 223.0 lb

## 2018-02-13 DIAGNOSIS — R102 Pelvic and perineal pain: Secondary | ICD-10-CM

## 2018-02-13 DIAGNOSIS — R635 Abnormal weight gain: Secondary | ICD-10-CM

## 2018-02-13 NOTE — Patient Instructions (Signed)

## 2018-02-13 NOTE — Progress Notes (Signed)
Patient complaining of weight gain and pelvic pain that has been off and on for a couple of months. Kathrene Alu RN

## 2018-02-13 NOTE — Progress Notes (Signed)
History:  57 y.o. G3P1020 here today for eval of pelvic pain or 'difference . She is also concerned about weight gain. 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  She reports that his has been a stressful venture. Pt reports restless sleep. She thinks this is due to stress. She reports that she has been overeating including ice cream and drinking sodas and not eating healthy.    The following portions of the patient's history were reviewed and updated as appropriate: allergies, current medications, past family history, past medical history, past social history, past surgical history and problem list.  Review of Systems:  Pertinent items are noted in HPI.    Objective:  Physical Exam Blood pressure 132/87, pulse 76, height 5\' 9"  (1.753 m), weight 223 lb (101.2 kg).  CONSTITUTIONAL: Well-developed, well-nourished female in no acute distress.  HENT:  Normocephalic, atraumatic EYES: Conjunctivae and EOM are normal. No scleral icterus.  NECK: Normal range of motion SKIN: Skin is warm and dry. No rash noted. Not diaphoretic.No pallor. [place]: Alert and oriented to person, place, and time. Normal coordination.  Abd: Soft, nontender and nondistended Pelvic: Normal appearing external genitalia; normal appearing vaginal mucosa and cervix.  Normal discharge.  Small uterus, no other palpable masses, no uterine or adnexal tenderness  Labs and Imaging No results found.  Assessment & Plan:  Excessive weight gain- as per conversation, no cluincal eval of the weight gain is indicated based on pts h/o excessive intake with decreased utilization of calories.   Rec walking 5-6x/day  Reviewed diet  Pelvic pressure  Exam WNL  Pt with no sx at present  rec f/u in 3 months or check progress on diet and exercise  Total face-to-face time with patient was 20  Greater than 50% was spent in counseling and coordination of care with the patient.   Maziyah Vessel L. Harraway-Smith, M.D., [facility]lynn June

## 2018-11-20 ENCOUNTER — Ambulatory Visit: Payer: BLUE CROSS/BLUE SHIELD | Admitting: Internal Medicine

## 2018-11-20 ENCOUNTER — Encounter: Payer: Self-pay | Admitting: Internal Medicine

## 2018-11-20 ENCOUNTER — Ambulatory Visit (INDEPENDENT_AMBULATORY_CARE_PROVIDER_SITE_OTHER): Payer: PRIVATE HEALTH INSURANCE | Admitting: Internal Medicine

## 2018-11-20 VITALS — BP 126/68 | HR 61 | Temp 98.2°F | Resp 16 | Ht 69.0 in | Wt 232.0 lb

## 2018-11-20 DIAGNOSIS — J324 Chronic pansinusitis: Secondary | ICD-10-CM | POA: Diagnosis not present

## 2018-11-20 DIAGNOSIS — J301 Allergic rhinitis due to pollen: Secondary | ICD-10-CM

## 2018-11-20 DIAGNOSIS — R0981 Nasal congestion: Secondary | ICD-10-CM

## 2018-11-20 DIAGNOSIS — H04123 Dry eye syndrome of bilateral lacrimal glands: Secondary | ICD-10-CM

## 2018-11-20 MED ORDER — AMOXICILLIN 500 MG PO CAPS: 1000.0000 mg | ORAL_CAPSULE | Freq: Two times a day (BID) | ORAL | 0 refills | Status: DC

## 2018-11-20 MED ORDER — PREDNISONE 10 MG PO TABS: ORAL_TABLET | ORAL | 0 refills | Status: DC

## 2018-11-20 MED ORDER — AZELASTINE HCL 0.1 % NA SOLN: 2.0000 | Freq: Two times a day (BID) | NASAL | 6 refills | Status: DC

## 2018-11-20 MED ORDER — MONTELUKAST SODIUM 10 MG PO TABS: 10.0000 mg | ORAL_TABLET | Freq: Every day | ORAL | 6 refills | Status: DC

## 2018-11-20 MED ORDER — OLOPATADINE HCL 0.2 % OP SOLN: OPHTHALMIC | 6 refills | Status: DC

## 2018-11-20 NOTE — Progress Notes (Signed)
Subjective:    Patient ID: Joanne Kim, female    DOB: 1961/07/30, 58 y.o.   MRN: [id number]  DOS:  11/20/2018 Type of visit - description: Acute visit 2- 3 months history of on and off upper respiratory symptoms: Sinus pressure, mild cough, mild headache, "teeth hurting", occasional ear popping and some dizziness. Currently taking Singulair and ipratropium. Historically, Flonase did not help.    Review of Systems Denies fever chills + Itchy eyes Some postnasal dripping, minimal discharge when she blow her nose. No sputum production.  Past Medical History:  Diagnosis Date  . Elevated blood pressure reading   . History of colon polyps   . Vaginal Pap smear, abnormal 2017    Past Surgical History:  Procedure Laterality Date  . bunyonectomy      Social History   Socioeconomic History  . Marital status: Single    Spouse name: Not on file  . Number of children: Not on file  . Years of education: Not on file  . Highest education level: Not on file  Occupational History  . Not on file  Social Needs  . Financial resource strain: Not on file  . Food insecurity:    Worry: Not on file    Inability: Not on file  . Transportation needs:    Medical: Not on file    Non-medical: Not on file  Tobacco Use  . Smoking status: Never Smoker  . Smokeless tobacco: Never Used  Substance and Sexual Activity  . Alcohol use: Yes    Comment: occasionally  . Drug use: No  . Sexual activity: Yes    Birth control/protection: Condom  Lifestyle  . Physical activity:    Days per week: Not on file    Minutes per session: Not on file  . Stress: Not on file  Relationships  . Social connections:    Talks on phone: Not on file    Gets together: Not on file    Attends religious service: Not on file    Active member of club or organization: Not on file    Attends meetings of clubs or organizations: Not on file    Relationship status: Not on file  . Intimate partner violence:   Fear of current or ex partner: Not on file    Emotionally abused: Not on file    Physically abused: Not on file    Forced sexual activity: Not on file  Other Topics Concern  . Not on file  Social History Narrative  . Not on file      Allergies as of 11/20/2018      Reactions   Oxycodone Itching      Medication List       Accurate as of November 20, 2018 10:02 AM. Always use your most recent med list.        azelastine 0.1 % nasal spray Commonly known as:  ASTELIN Place 2 sprays into both nostrils 2 (two) times daily. Use in each nostril as directed   FLUoxetine 20 MG tablet Commonly known as:  PROZAC Take 1 tablet (20 mg total) by mouth daily.   fluticasone 50 MCG/ACT nasal spray Commonly known as:  FLONASE Place 2 sprays into both nostrils daily.   montelukast 10 MG tablet Commonly known as:  SINGULAIR Take 1 tablet (10 mg total) by mouth at bedtime. Use as needed for allergies   multivitamin tablet Take 1 tablet by mouth daily.   Olopatadine HCl 0.2 % Soln Apply one  drop to each eye daily           Objective:   Physical Exam BP 126/68 (BP Location: Left Arm, Patient Position: Sitting, Cuff Size: Normal)   Pulse 61   Temp 98.2 F (36.8 C) (Oral)   Resp 16   Ht 5\' 9"  (1.753 m)   Wt 232 lb (105.2 kg)   SpO2 99%   BMI 34.26 kg/m   General:   Well developed, NAD, BMI noted. HEENT:  Normocephalic . Face symmetric, atraumatic. TMs slightly bulged bilaterally no red. Throat symmetric not red Nose is slightly congested, sinuses mildly TTP throughout. Lungs:  CTA B Normal respiratory effort, no intercostal retractions, no accessory muscle use. Heart: RRR,  no murmur.  No pretibial edema bilaterally  Skin: Not pale. Not jaundice Neurologic:  alert & oriented X3.  Speech normal, gait appropriate for age and unassisted Psych--  Cognition and judgment appear intact.  Cooperative with normal attention span and concentration.  Behavior  appropriate. No anxious or depressed appearing.      Assessment    58 year old female, PMH include history of elevated BP, allergies, presents with Sinusitis: Suspect symptoms are allergic rather than bacterial. Recommend prolonged and consistent use of Singulair, Claritin or Zyrtec, Singulair, Astelin.  Historically Flonase did not help.   Also a round of prednisone. If in 2 weeks she is not feeling any better, proceed with an amoxicillin prescription. Call if not better. See AVS

## 2018-11-20 NOTE — Progress Notes (Signed)
Pre visit review using our clinic review tool, if applicable. No additional management support is needed unless otherwise documented below in the visit note. 

## 2018-11-20 NOTE — Patient Instructions (Addendum)
I think you have sinusitis due to allergies  Please take over-the-counter antihistaminic daily, could be Claritin 10 mg or Zyrtec (night).  Continue Singulair  (day)  Use Astelin twice a day every day  Use the eyedrops daily  Take prednisone for few days, this is a steroid, will help significantly.  Stop the ipratropium nasal spray  You have a prescription for an antibiotic, please only use it in 2 weeks if you are not improving.  Your allergy medicine probably needs to be taken year-round (except prednisone)

## 2018-12-05 ENCOUNTER — Telehealth: Payer: Self-pay | Admitting: Family Medicine

## 2018-12-05 ENCOUNTER — Encounter: Payer: Self-pay | Admitting: Family Medicine

## 2018-12-05 MED ORDER — FLUCONAZOLE 150 MG PO TABS: 150.0000 mg | ORAL_TABLET | Freq: Once | ORAL | 0 refills | Status: AC

## 2018-12-05 NOTE — Telephone Encounter (Signed)
Copied from [place] [id number]. Topic: Quick Communication - See Telephone Encounter >> Dec 05, 2018  1:18 PM Blase Mess A wrote: CRM for notification. See Telephone encounter for: 12/05/18.  Patient is calling because she is taking amoxicillin (AMOXIL) 500 MG capsule [[id number]] and feels that she has a yeast infection. She would like to know can something be called in for the yeast infection. Please advise. [id number] - [place], [place] [phone] (Phone) [phone] (Fax)

## 2018-12-11 ENCOUNTER — Encounter: Payer: Self-pay | Admitting: Obstetrics & Gynecology

## 2018-12-11 ENCOUNTER — Ambulatory Visit (INDEPENDENT_AMBULATORY_CARE_PROVIDER_SITE_OTHER): Payer: PRIVATE HEALTH INSURANCE | Admitting: Obstetrics & Gynecology

## 2018-12-11 VITALS — BP 115/76 | HR 79 | Ht 69.0 in | Wt 231.0 lb

## 2018-12-11 DIAGNOSIS — Z7689 Persons encountering health services in other specified circumstances: Secondary | ICD-10-CM

## 2018-12-11 DIAGNOSIS — E669 Obesity, unspecified: Secondary | ICD-10-CM | POA: Diagnosis not present

## 2018-12-11 NOTE — Progress Notes (Signed)
History:  58 y.o. G3P1020 here today for follow up of weight management issues. Pt reports major life stressors and she has not prioritized her health. Has not begun the walking program and has not made changes to her diet at present.  Pt has had sinus issues and feels 'horrible' has been on ATBX and steroids.   The following portions of the patient's history were reviewed and updated as appropriate: allergies, current medications, past family history, past medical history, past social history, past surgical history and problem list.  Review of Systems:  Pertinent items are noted in HPI.    Objective:  Physical Exam Blood pressure 115/76, pulse 79, height 5\' 9"  (1.753 m), weight 231 lb 0.6 oz (104.8 kg).  Weight Up 8#   CONSTITUTIONAL: Well-developed, well-nourished female in no acute distress.  HENT:  Normocephalic, atraumatic EYES: Conjunctivae and EOM are normal. No scleral icterus.  NECK: Normal range of motion SKIN: Skin is warm and dry. No rash noted. Not diaphoretic.No pallor. Spring: Alert and oriented to person, place, and time. Normal coordination.   Labs and Imaging No results found.  Assessment & Plan:   Weight management/ health maintenance. BMI 34    Needs to take a 4-5 days no technology vacation Start walking 5x/week Get referral for an ENT F/u 3 months  Total face-to-face time with patient was 44min.  Greater than 50% was spent in counseling and coordination of care with the patient.    Teryl Gubler L. Harraway-Smith, M.D., [place]. Harraway-Smith, M.D., Cherlynn June

## 2018-12-11 NOTE — Patient Instructions (Signed)

## 2018-12-12 ENCOUNTER — Encounter: Payer: Self-pay | Admitting: Obstetrics & Gynecology

## 2018-12-30 ENCOUNTER — Other Ambulatory Visit: Payer: Self-pay

## 2018-12-30 ENCOUNTER — Encounter: Payer: Self-pay | Admitting: Family Medicine

## 2018-12-30 ENCOUNTER — Ambulatory Visit (INDEPENDENT_AMBULATORY_CARE_PROVIDER_SITE_OTHER): Payer: PRIVATE HEALTH INSURANCE | Admitting: Family Medicine

## 2018-12-30 DIAGNOSIS — J3489 Other specified disorders of nose and nasal sinuses: Secondary | ICD-10-CM

## 2018-12-30 MED ORDER — PREDNISONE 20 MG PO TABS: ORAL_TABLET | ORAL | 0 refills | Status: DC

## 2018-12-30 NOTE — Progress Notes (Signed)
Virtual Visit via Telephone Note  I connected with Joanne Kim on 12/30/18 at  4:20 PM EDT by telephone and verified that I am speaking with the correct person using two identifiers.   I discussed the limitations, risks, security and privacy concerns of performing an evaluation and management service by telephone and the availability of in person appointments. I also discussed with the patient that there may be a patient responsible charge related to this service. The patient expressed understanding and agreed to proceed.  History of Present Illness: Pt reports that she is having allergies- sinus congestion and pressure She has had the sx now for about 6 weeks She notes pressure in her face and sinuses No fever or cough No vomiting Dull HA but nothing dramatic or out of the ordinary She is using claritin, Singulair, astelin She does not have DM  She was in [place] 5 weeks ago and saw Dr. Larose Kells for this problem- he gave her prednisone and amox to hold.  She is not sure if the prednisone helped temporarily She filled her rx for amoxicillin about 2 weeks later but it also did not seem to help  She also notes that she was rubbing her right knee over the weekend and she felt a little firm "ball" under the skin.  It is just a bit tender she thinks due to rubbing it a lot It has been present for an unknown period of time No redness or swelling, no heat She can walk ok, no pain   Observations/Objective: Pt sounds well She is not checking vitals at home She went to see her GYN recently and her BP was very good  BP Readings from Last 3 Encounters:  12/11/18 115/76  11/20/18 126/68  02/13/18 132/87     Assessment and Plan: Sinus pressure - Plan: predniSONE (DELTASONE) 20 MG tablet    Follow Up Instructions: She is really taking all the allergy meds that I would suggest.  She has already done an appropriate course of amoxicillin for sinus infection.  Will have her try a 2nd round of  prednisone for her symptoms.  She will let me know if this is not helpful in the next few days, sooner if she is getting worse. She will also see me for what sounds like a cyst on her knee once things are back to normal  Meds ordered this encounter  Medications  . predniSONE (DELTASONE) 20 MG tablet    Sig: Take 2 pills a day for 5 days, then 1 pill a day for 5 days    Dispense:  15 tablet    Refill:  0      I discussed the assessment and treatment plan with the patient. The patient was provided an opportunity to ask questions and all were answered. The patient agreed with the plan and demonstrated an understanding of the instructions.   The patient was advised to call back or seek an in-person evaluation if the symptoms worsen or if the condition fails to improve as anticipated.  I provided 8 minutes of non-face-to-face time during this encounter.   Lamar Blinks, MD   [facility] [address], [place], [place] [address] [phone]  Date:  12/30/2018   Name:  Joanne Kim   DOB:  1961/01/29   MRN:  [id number]  PCP:  [name], MD    Chief Complaint: No chief complaint on file.  History of Present Illness:  Joanne Kim is a 58 y.o. very pleasant female patient who presents with the following:    Patient Active Problem List   Diagnosis Date Noted  . Dry eyes 10/23/2016  . Obesity 10/23/2016  . RECTAL BLEEDING 12/14/2009    Past Medical History:  Diagnosis Date  . Elevated blood pressure reading   . History of colon polyps   . Vaginal Pap smear, abnormal 2017    Past Surgical History:  Procedure Laterality Date  . bunyonectomy      Social History   Tobacco Use  . Smoking status: Never Smoker  . Smokeless tobacco: Never Used  Substance Use Topics  . Alcohol use: Yes    Comment: occasionally  . Drug use: No    Family History  Problem Relation Age of Onset  .  Heart disease Other   . Stroke Other   . Hypertension Other   . Breast cancer Maternal Aunt     Allergies  Allergen Reactions  . Oxycodone Itching    Medication list has been reviewed and updated.  Current Outpatient Medications on File Prior to Visit  Medication Sig Dispense Refill  . amoxicillin (AMOXIL) 500 MG capsule Take 2 capsules (1,000 mg total) by mouth 2 (two) times daily. (Patient not taking: Reported on 12/11/2018) 28 capsule 0  . azelastine (ASTELIN) 0.1 % nasal spray Place 2 sprays into both nostrils 2 (two) times daily. Use in each nostril as directed 30 mL 6  . montelukast (SINGULAIR) 10 MG tablet Take 1 tablet (10 mg total) by mouth at bedtime. Use as needed for allergies 30 tablet 6  . Olopatadine HCl 0.2 % SOLN Apply one drop to each eye daily 2.5 mL 6   No current facility-administered medications on file prior to visit.     Review of Systems:    Physical Examination: There were no vitals filed for this visit. There were no vitals filed for this visit. There is no height or weight on file to calculate BMI. Ideal Body Weight:      Assessment and Plan:   Signed Lamar Blinks, MD

## 2019-02-07 ENCOUNTER — Other Ambulatory Visit: Payer: Self-pay

## 2019-02-07 ENCOUNTER — Ambulatory Visit (INDEPENDENT_AMBULATORY_CARE_PROVIDER_SITE_OTHER): Payer: PRIVATE HEALTH INSURANCE | Admitting: Medical

## 2019-02-07 ENCOUNTER — Telehealth: Payer: Self-pay

## 2019-02-07 ENCOUNTER — Encounter: Payer: Self-pay | Admitting: Medical

## 2019-02-07 DIAGNOSIS — J0100 Acute maxillary sinusitis, unspecified: Secondary | ICD-10-CM | POA: Diagnosis not present

## 2019-02-07 DIAGNOSIS — J301 Allergic rhinitis due to pollen: Secondary | ICD-10-CM

## 2019-02-07 MED ORDER — AMOXICILLIN-POT CLAVULANATE 875-125 MG PO TABS: 1.0000 | ORAL_TABLET | Freq: Two times a day (BID) | ORAL | 0 refills | Status: DC

## 2019-02-07 MED ORDER — LEVOCETIRIZINE DIHYDROCHLORIDE 5 MG PO TABS: 5.0000 mg | ORAL_TABLET | Freq: Every evening | ORAL | 3 refills | Status: DC

## 2019-02-07 MED ORDER — PREDNISONE 10 MG (21) PO TBPK: ORAL_TABLET | ORAL | 0 refills | Status: DC

## 2019-02-07 NOTE — Telephone Encounter (Signed)
Virtual visit scheduled.  

## 2019-02-07 NOTE — Patient Instructions (Addendum)
Recent allergic rhinitis signs and symptoms preceding likely sinus infection.  For allergies rx xyzal in place of zyrtec. Continue oloptadine eye drops, astelin and montelukast. Will add 6 day prednisone to help over short term for allergies and rx augmentin antibiotic.  Follow up 7 days or as needed.

## 2019-02-07 NOTE — Progress Notes (Signed)
   Subjective:    Patient ID: Joanne Kim, female    DOB: November 11, 1960, 58 y.o.   MRN: [id number]  HPI  Virtual Visit via Video Note  I connected with Joanne Kim on 02/07/19 at 11:20 AM EDT by a video enabled telemedicine application and verified that I am speaking with the correct person using two identifiers.  Location: Patient: home Provider: office  No vital checked.   I discussed the limitations of evaluation and management by telemedicine and the availability of in person appointments. The patient expressed understanding and agreed to proceed.  History of Present Illness: Pt states that she feels that her allergies have flared with possible sinus infection. Pt states feel like her typical allergies at onset and now progressing to sinus infection. She has some fatigue. No fever, no chills or sweats. Pt is on astelin. She ran out of montulast one month. Pt using zyrtec and olaptadine.  Has maxillary sinus press and frontal sinus pressure. Some colored mucus when blows nose.  Overall symptoms ffor 2 days  Hx of sinus infections easily.    Observations/Objective: No acute distress.  heent- when pt presses on sinuses maxillary and frontal reports pain.  Assessment and Plan: Recent allergic rhinitis signs and symptoms preceding likely sinus infection.  For allergies rx xyzal in place of zyrtec. Continue oloptadine eye drops, astelin and montelukast. Will add 6 day prednisone to help over short term for allergies and rx augmentin antibiotic.  Follow up 7 days or as needed.  Follow Up Instructions:    I discussed the assessment and treatment plan with the patient. The patient was provided an opportunity to ask questions and all were answered. The patient agreed with the plan and demonstrated an understanding of the instructions.   The patient was advised to call back or seek an in-person evaluation if the symptoms worsen or if the condition fails to improve  as anticipated.     Mackie Pai, PA-C    Review of Systems  Respiratory: Negative for cough.        Objective:   Physical Exam        Assessment & Plan:

## 2019-02-07 NOTE — Telephone Encounter (Signed)
Copied from [place] [id number]. Topic: Appointment Scheduling - Scheduling Inquiry for Clinic >> Feb 06, 2019  4:50 PM Rutherford Nail, [place] wrote: Reason for CRM: Patient calling and states that she is having allergies. States that she was recently in about a month ago for the same thing. States that she is having sinus congestion and pressure. Would like to schedule a virtual appointment.  CB#: [phone]

## 2019-02-14 ENCOUNTER — Telehealth: Payer: Self-pay | Admitting: Family Medicine

## 2019-02-14 MED ORDER — FLUCONAZOLE 150 MG PO TABS: 150.0000 mg | ORAL_TABLET | Freq: Once | ORAL | 0 refills | Status: AC

## 2019-02-14 NOTE — Telephone Encounter (Signed)
Rx diflucan sent to pt pharmacy.

## 2019-02-14 NOTE — Telephone Encounter (Signed)
Copied from [place] [id number]. Topic: Quick Communication - Rx Refill/Question >> Feb 14, 2019  8:31 AM Burchel, Abbi R wrote: Medication: Vaginal Yeast Infection Treatment  Pt states she needs treatment due to abx prescribed during last OV.   [facility]acy: [id number] [place], [place] [phone] (Phone) [phone] (Fax)

## 2019-02-21 MED ORDER — FLUCONAZOLE 150 MG PO TABS: 150.0000 mg | ORAL_TABLET | Freq: Once | ORAL | 0 refills | Status: AC

## 2019-02-21 NOTE — Telephone Encounter (Signed)
Please advise 

## 2019-02-21 NOTE — Addendum Note (Signed)
Addended by: Anabel Halon on: 02/21/2019 12:46 PM   Modules accepted: Orders

## 2019-02-21 NOTE — Telephone Encounter (Signed)
If this does not clear symptoms will need virtual visit and likely urine ancillary studies. Rx diflucan sent to pharmacy.

## 2019-02-21 NOTE — Telephone Encounter (Signed)
Pt called in to request another Rx for diflucan to the pharmacy. Pt says that she took the pill that was sent in but it didn't clear the yeast infection completely up.   Pt says that she would like to use the same pharmacy.

## 2019-03-06 ENCOUNTER — Ambulatory Visit (INDEPENDENT_AMBULATORY_CARE_PROVIDER_SITE_OTHER): Payer: PRIVATE HEALTH INSURANCE | Admitting: Family Medicine

## 2019-03-06 ENCOUNTER — Encounter: Payer: Self-pay | Admitting: Family Medicine

## 2019-03-06 ENCOUNTER — Other Ambulatory Visit: Payer: Self-pay

## 2019-03-06 DIAGNOSIS — N898 Other specified noninflammatory disorders of vagina: Secondary | ICD-10-CM

## 2019-03-06 MED ORDER — FLUCONAZOLE 150 MG PO TABS: 150.0000 mg | ORAL_TABLET | Freq: Once | ORAL | 0 refills | Status: AC

## 2019-03-06 NOTE — Progress Notes (Signed)
[place] [facility] [address] [place], [place] [address] [phone]  Date:  03/06/2019   Name:  Joanne Kim   DOB:  07/22/1961   MRN:  [id number]  PCP:  [name], MD    Chief Complaint: No chief complaint on file.   History of Present Illness:  Joanne Kim is a 58 y.o. very pleasant female patient who presents with the following:  Virtual visit today for concern of vaginal irritation. Patient location is home Provider location is office Patient identity confirmed with 2 identifiers, she consents for urgent visit today.  She took a course of Augmentin in early May for a sinus infection This seemed to trigger a vaginal yeast infection She has used diflucan now for 2 doses  Her most recent dose was about 2 weeks She notes sx of itching, no discharge. Her vulva was feeling inflamed and raw, this is better Her sx are improved but not gone  No new sexual partners   She is starting to worry that there could be some other cause of her symptoms  She has no history of diabetes, but we have not checked labs for her recently Most recent A1c was in 2018 Patient Active Problem List   Diagnosis Date Noted  . Dry eyes 10/23/2016  . Obesity 10/23/2016  . RECTAL BLEEDING 12/14/2009    Past Medical History:  Diagnosis Date  . Elevated blood pressure reading   . History of colon polyps   . Vaginal Pap smear, abnormal 2017    Past Surgical History:  Procedure Laterality Date  . bunyonectomy      Social History   Tobacco Use  . Smoking status: Never Smoker  . Smokeless tobacco: Never Used  Substance Use Topics  . Alcohol use: Yes    Comment: occasionally  . Drug use: No    Family History  Problem Relation Age of Onset  . Heart disease Other   . Stroke Other   . Hypertension Other   . Breast cancer Maternal Aunt     Allergies  Allergen Reactions  . Oxycodone Itching     Medication list has been reviewed and updated.  Current Outpatient Medications on File Prior to Visit  Medication Sig Dispense Refill  . amoxicillin (AMOXIL) 500 MG capsule Take 2 capsules (1,000 mg total) by mouth 2 (two) times daily. (Patient not taking: Reported on 12/11/2018) 28 capsule 0  . amoxicillin-clavulanate (AUGMENTIN) 875-125 MG tablet Take 1 tablet by mouth 2 (two) times daily. 20 tablet 0  . azelastine (ASTELIN) 0.1 % nasal spray Place 2 sprays into both nostrils 2 (two) times daily. Use in each nostril as directed 30 mL 6  . levocetirizine (XYZAL) 5 MG tablet Take 1 tablet (5 mg total) by mouth every evening. 30 tablet 3  . montelukast (SINGULAIR) 10 MG tablet Take 1 tablet (10 mg total) by mouth at bedtime. Use as needed for allergies 30 tablet 6  . Olopatadine HCl 0.2 % SOLN Apply one drop to each eye daily 2.5 mL 6  . predniSONE (STERAPRED UNI-PAK 21 TAB) 10 MG (21) TBPK tablet Taper over 6 days 21 tablet 0   No current facility-administered medications on file prior to visit.     Review of Systems:  No fever or chills  Physical Examination: There were no vitals filed for this visit. There were no vitals filed for this visit. There is no height or weight  on file to calculate BMI. Ideal Body Weight:    Pt observed on video -she looks well, no wheezing, cough, or distress  Assessment and Plan: Vaginal itching - Plan: fluconazole (DIFLUCAN) 150 MG tablet  Persistent vaginal itching.  Will treat with Diflucan, 1 dose weekly for up to 6 weeks.  However she is overdue for a physical and labs, and would like to do a vaginal swab due to persistent symptoms.  We will bring her in for a physical next week  Meds ordered this encounter  Medications  . fluconazole (DIFLUCAN) 150 MG tablet    Sig: Take 1 tablet (150 mg total) by mouth once for 1 dose. Repeat weekly as needed    Dispense:  6 tablet    Refill:  0     Signed Lamar Blinks, MD

## 2019-03-11 ENCOUNTER — Telehealth: Payer: Self-pay

## 2019-03-11 NOTE — Telephone Encounter (Signed)
Left message to return call TO SCHEDULE PHYSICAL

## 2019-03-11 NOTE — Telephone Encounter (Signed)
-----   Message from [name], MD sent at 03/06/2019  4:09 PM EDT ----- Hi- please schedule for a CPE with me next week TY!

## 2019-03-13 ENCOUNTER — Ambulatory Visit: Payer: PRIVATE HEALTH INSURANCE | Admitting: Family Medicine

## 2019-03-14 NOTE — Progress Notes (Addendum)
[facility] [address] [place], [place] [address] [phone]  Date:  03/17/2019   Name:  Joanne Kim   DOB:  15-Aug-1961   MRN:  [id number]  PCP:  Joanne Mclean, MD    Chief Complaint: Annual Exam   History of Present Illness:  Joanne Kim is a 58 y.o. very pleasant female patient who presents with the following:  In person visit today for physical exam  Last seen by myself virtually in late May for vaginal itching.  She had used a couple of recent antibiotic courses, and we suspected persistent yeast.  I had her use weekly Diflucan as needed- she may feel like all is well, but then will still have some itching on and off.  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 Ihor Kim.  Can do this for her today Labs: do today, she is not fasting but did not eat much yet today Immunizations: Can suggest Shingrix- discussed with her today.  She declines immunization for now  She is taking a couple of supplements and collagen   Her work has been stressful due to pandemic, but she is glad to be employed  Never smoker, occasional alcohol  Patient Active Problem List   Diagnosis Date Noted  . Dry eyes 10/23/2016  . Obesity 10/23/2016  . RECTAL BLEEDING 12/14/2009    Past Medical History:  Diagnosis Date  . Elevated blood pressure reading   . History of colon polyps   . Vaginal Pap smear, abnormal 2017    Past Surgical History:  Procedure Laterality Date  . bunyonectomy      Social History   Tobacco Use  . Smoking status: Never Smoker  . Smokeless tobacco: Never Used   Substance Use Topics  . Alcohol use: Yes    Comment: occasionally  . Drug use: No    Family History  Problem Relation Age of Onset  . Heart disease Other   . Stroke Other   . Hypertension Other   . Breast cancer Maternal Aunt     Allergies  Allergen Reactions  . Oxycodone Itching    Medication list has been reviewed and updated.  Current Outpatient Medications on File Prior to Visit  Medication Sig Dispense Refill  . fluconazole (DIFLUCAN) 150 MG tablet TAKE 1 TABLET (150 MG TOTAL) BY MOUTH ONCE FOR 1 DOSE. REPEAT WEEKLY AS NEEDED    . levocetirizine (XYZAL) 5 MG tablet Take 1 tablet (5 mg total) by mouth every evening. 30 tablet 3  . montelukast (SINGULAIR) 10 MG tablet Take 1 tablet (10 mg total) by mouth at bedtime. Use as needed for allergies 30 tablet 6  . Olopatadine HCl 0.2 % SOLN Apply one drop to each eye daily 2.5 mL 6  . UNABLE TO FIND Med Name: Joanne Kim     No current facility-administered medications on file prior to visit.     Review of Systems:  As per HPI- otherwise negative.   Physical Examination: Vitals:   03/17/19 1405  BP: 118/72  Pulse: (!) 57  Resp: 16  Temp: 98.3 F (36.8 C)  SpO2: 100%   Vitals:   03/17/19 1405  Weight: 230 lb (104.3 kg)  Height: 5\' 9"  (1.753 m)   Body mass index is 33.97 kg/m. Ideal Body Weight: Weight in (lb) to have BMI = 25: 168.9  GEN: WDWN, NAD, Non-toxic, A & O x 3, obese, looks well  HEENT: Atraumatic, Normocephalic. Neck supple. No masses, No LAD. Bilateral TM wnl, oropharynx normal.  PEERL,EOMI.   Ears and Nose: No external deformity. CV: RRR, No M/G/R. No JVD. No thrill. No extra heart sounds. PULM: CTA B, no wheezes, crackles, rhonchi. No retractions. No resp. distress. No accessory muscle use. ABD: S, NT, ND, +BS. No rebound. No HSM. EXTR: No c/c/e NEURO Normal gait.  PSYCH: Normally interactive. Conversant. Not depressed or anxious appearing.  Calm demeanor.  Breast: normal exam, no masses/  dimpling/ discharge Pelvic: normal, no vaginal lesions or discharge. Uterus normal, no CMT, no adnexal tendereness or masses   Assessment and Plan: Physical exam  Screening for diabetes mellitus - Plan: Comprehensive metabolic panel, Hemoglobin A1c  Screening for deficiency anemia - Plan: CBC  Screening for hyperlipidemia - Plan: Lipid panel  Exposure to hepatitis B - Plan: Hepatitis B surface antibody,quantitative  Vaginal itching - Plan: UNABLE TO FIND, fluconazole (DIFLUCAN) 150 MG tablet, Cervicovaginal ancillary only( [place])  Screening for breast cancer - Plan: MM 3D SCREEN BREAST BILATERAL  Screening for cervical cancer - Plan: Cytology - PAP  Here today for complete physical exam.  History of HPV on Pap in 2018, will recheck this for today Also did a wet prep due to persistent vaginal itching Ordered mammogram Joanne Kim recalls having 2 of the 3 hepatitis B shots.  We will do a titer today check for immunity Otherwise plan as above   Signed Joanne Blinks, MD Received some labs, message to pt 6/9  Results for orders placed or performed in visit on 03/17/19  CBC  Result Value Ref Range   WBC 6.6 4.0 - 10.5 K/uL   RBC 4.81 3.87 - 5.11 Mil/uL   Platelets 174.0 150.0 - 400.0 K/uL   Hemoglobin 14.1 12.0 - 15.0 g/dL   HCT 43.6 36.0 - 46.0 %   MCV 90.6 78.0 - 100.0 fl   MCHC 32.3 30.0 - 36.0 g/dL   RDW 15.0 11.5 - 15.5 %  Comprehensive metabolic panel  Result Value Ref Range   Sodium 140 135 - 145 mEq/L   Potassium 4.0 3.5 - 5.1 mEq/L   Chloride 104 96 - 112 mEq/L   CO2 28 19 - 32 mEq/L   Glucose, Bld 79 70 - 99 mg/dL   BUN 12 6 - 23 mg/dL   Creatinine, Ser 0.72 0.40 - 1.20 mg/dL   Total Bilirubin 0.8 0.2 - 1.2 mg/dL   Alkaline Phosphatase 85 39 - 117 U/L   AST 18 0 - 37 U/L   ALT 10 0 - 35 U/L   Total Protein 6.6 6.0 - 8.3 g/dL   Albumin 4.0 3.5 - 5.2 g/dL   Calcium 9.6 8.4 - 10.5 mg/dL   GFR 101.42 >60.00 mL/min  Hemoglobin A1c  Result Value Ref  Range   Hgb A1c MFr Bld 5.6 4.6 - 6.5 %  Lipid panel  Result Value Ref Range   Cholesterol 181 0 - 200 mg/dL   Triglycerides 115.0 0.0 - 149.0 mg/dL   HDL 55.90 >39.00 mg/dL   VLDL 23.0 0.0 - 40.0 mg/dL   LDL Cholesterol  102 (H) 0 - 99 mg/dL   Total CHOL/HDL Ratio 3    NonHDL 125.18   Hepatitis B surface antibody,quantitative  Result Value Ref Range   Hepatitis B-Post <5 (L) > OR = 10 mIU/mL  Cytology - PAP  Result Value Ref Range   Adequacy      Satisfactory for evaluation  endocervical/transformation zone component PRESENT.   Diagnosis      NEGATIVE FOR INTRAEPITHELIAL LESIONS OR MALIGNANCY.   HPV NOT DETECTED    Material Submitted CervicoVaginal Pap [ThinPrep Imaged]    CYTOLOGY - PAP PAP RESULT   Cervicovaginal ancillary only( [place])  Result Value Ref Range   Bacterial vaginitis **POSITIVE for Gardnerella vaginalis** (A)    Candida vaginitis **POSITIVE for Candida species** (A)    Chlamydia Negative    Neisseria gonorrhea Negative    Trichomonas Negative

## 2019-03-17 ENCOUNTER — Other Ambulatory Visit: Payer: Self-pay

## 2019-03-17 ENCOUNTER — Ambulatory Visit (INDEPENDENT_AMBULATORY_CARE_PROVIDER_SITE_OTHER): Payer: PRIVATE HEALTH INSURANCE | Admitting: Family Medicine

## 2019-03-17 ENCOUNTER — Other Ambulatory Visit (HOSPITAL_COMMUNITY)
Admission: RE | Admit: 2019-03-17 | Discharge: 2019-03-17 | Disposition: A | Discharge status: Home / Self Care | Payer: PRIVATE HEALTH INSURANCE | Source: Ambulatory Visit | Attending: Family Medicine | Admitting: Family Medicine

## 2019-03-17 ENCOUNTER — Encounter: Payer: Self-pay | Admitting: Family Medicine

## 2019-03-17 VITALS — BP 118/72 | HR 57 | Temp 98.3°F | Resp 16 | Ht 69.0 in | Wt 230.0 lb

## 2019-03-17 DIAGNOSIS — Z13220 Encounter for screening for lipoid disorders: Secondary | ICD-10-CM

## 2019-03-17 DIAGNOSIS — Z1239 Encounter for other screening for malignant neoplasm of breast: Secondary | ICD-10-CM

## 2019-03-17 DIAGNOSIS — Z130 Encounter for screening for diseases of the blood and blood-forming organs and certain disorders involving the immune mechanism: Secondary | ICD-10-CM

## 2019-03-17 DIAGNOSIS — Z131 Encounter for screening for diabetes mellitus: Secondary | ICD-10-CM | POA: Diagnosis not present

## 2019-03-17 DIAGNOSIS — B9689 Other specified bacterial agents as the cause of diseases classified elsewhere: Secondary | ICD-10-CM

## 2019-03-17 DIAGNOSIS — Z0000 Encounter for general adult medical examination without abnormal findings: Secondary | ICD-10-CM | POA: Insufficient documentation

## 2019-03-17 DIAGNOSIS — N898 Other specified noninflammatory disorders of vagina: Secondary | ICD-10-CM

## 2019-03-17 DIAGNOSIS — Z205 Contact with and (suspected) exposure to viral hepatitis: Secondary | ICD-10-CM

## 2019-03-17 DIAGNOSIS — N760 Acute vaginitis: Secondary | ICD-10-CM

## 2019-03-17 DIAGNOSIS — Z124 Encounter for screening for malignant neoplasm of cervix: Secondary | ICD-10-CM

## 2019-03-17 NOTE — Patient Instructions (Signed)
Great to see you today!  I will be in touch with your labs and pap asap I would recommend a calcium and vitamin D supplement for you  I ordered your mammogram for you today- to be done here at the [place] Adopting a healthy lifestyle and getting preventive care can go a long way to promote health and wellness. Talk with your health care provider about what schedule of regular examinations is right for you. This is a good chance for you to check in with your provider about disease prevention and staying healthy. In between checkups, there are plenty of things you can do on your own. Experts have done a lot of research about which lifestyle changes and preventive measures are most likely to keep you healthy. Ask your health care provider for more information. Weight and diet Eat a healthy diet  Be sure to include plenty of vegetables, fruits, low-fat dairy products, and lean protein.  Do not eat a lot of foods high in solid fats, added sugars, or salt.  Get regular exercise. This is one of the most important things you can do for your health. ? Most adults should exercise for at least 150 minutes each week. The exercise should increase your heart rate and make you sweat (moderate-intensity exercise). ? Most adults should also do strengthening exercises at least twice a week. This is in addition to the moderate-intensity exercise. Maintain a healthy weight  Body mass index (BMI) is a measurement that can be used to identify possible weight problems. It estimates body fat based on height and weight. Your health care provider can help determine your BMI and help you achieve or maintain a healthy weight.  For females 42 years of age and older: ? A BMI below 18.5 is considered underweight. ? A BMI of 18.5 to 24.9 is normal. ? A BMI of 25 to 29.9 is considered overweight. ? A BMI of 30 and above is considered obese. Watch levels of cholesterol and blood lipids  You  should start having your blood tested for lipids and cholesterol at 56 years of age, then have this test every 5 years.  You may need to have your cholesterol levels checked more often if: ? Your lipid or cholesterol levels are high. ? You are older than 56 years of age. ? You are at high risk for heart disease. Cancer screening Lung Cancer  Lung cancer screening is recommended for adults 30-61 years old who are at high risk for lung cancer because of a history of smoking.  A yearly low-dose CT scan of the lungs is recommended for people who: ? Currently smoke. ? Have quit within the past 15 years. ? Have at least a 30-pack-year history of smoking. A pack year is smoking an average of one pack of cigarettes a day for 1 year.  Yearly screening should continue until it has been 15 years since you quit.  Yearly screening should stop if you develop a health problem that would prevent you from having lung cancer treatment. Breast Cancer  Practice breast self-awareness. This means understanding how your breasts normally appear and feel.  It also means doing regular breast self-exams. Let your health care provider know about any changes, no matter how small.  If you are in your 20s or 30s, you should have a clinical breast exam (CBE) by a health care provider every 1-3 years as part of a regular health exam.  If you are 40 or older,  have a CBE every year. Also consider having a breast X-ray (mammogram) every year.  If you have a family history of breast cancer, talk to your health care provider about genetic screening.  If you are at high risk for breast cancer, talk to your health care provider about having an MRI and a mammogram every year.  Breast cancer gene (BRCA) assessment is recommended for women who have family members with BRCA-related cancers. BRCA-related cancers include: ? Breast. ? Ovarian. ? Tubal. ? Peritoneal cancers.  Results of the assessment will determine the need  for genetic counseling and BRCA1 and BRCA2 testing. Cervical Cancer Your health care provider may recommend that you be screened regularly for cancer of the pelvic organs (ovaries, uterus, and vagina). This screening involves a pelvic examination, including checking for microscopic changes to the surface of your cervix (Pap test). You may be encouraged to have this screening done every 3 years, beginning at age 82.  For women ages 3-65, health care providers may recommend pelvic exams and Pap testing every 3 years, or they may recommend the Pap and pelvic exam, combined with testing for human papilloma virus (HPV), every 5 years. Some types of HPV increase your risk of cervical cancer. Testing for HPV may also be done on women of any age with unclear Pap test results.  Other health care providers may not recommend any screening for nonpregnant women who are considered low risk for pelvic cancer and who do not have symptoms. Ask your health care provider if a screening pelvic exam is right for you.  If you have had past treatment for cervical cancer or a condition that could lead to cancer, you need Pap tests and screening for cancer for at least 20 years after your treatment. If Pap tests have been discontinued, your risk factors (such as having a new sexual partner) need to be reassessed to determine if screening should resume. Some women have medical problems that increase the chance of getting cervical cancer. In these cases, your health care provider may recommend more frequent screening and Pap tests. Colorectal Cancer  This type of cancer can be detected and often prevented.  Routine colorectal cancer screening usually begins at 56 years of age and continues through 56 years of age.  Your health care provider may recommend screening at an earlier age if you have risk factors for colon cancer.  Your health care provider may also recommend using home test kits to check for hidden blood in the  stool.  A small camera at the end of a tube can be used to examine your colon directly (sigmoidoscopy or colonoscopy). This is done to check for the earliest forms of colorectal cancer.  Routine screening usually begins at age 49.  Direct examination of the colon should be repeated every 5-10 years through 56 years of age. However, you may need to be screened more often if early forms of precancerous polyps or small growths are found. Skin Cancer  Check your skin from head to toe regularly.  Tell your health care provider about any new moles or changes in moles, especially if there is a change in a mole's shape or color.  Also tell your health care provider if you have a mole that is larger than the size of a pencil eraser.  Always use sunscreen. Apply sunscreen liberally and repeatedly throughout the day.  Protect yourself by wearing long sleeves, pants, a wide-brimmed hat, and sunglasses whenever you are outside. Heart disease, diabetes, and  high blood pressure  High blood pressure causes heart disease and increases the risk of stroke. High blood pressure is more likely to develop in: ? People who have blood pressure in the high end of the normal range (130-139/85-89 mm Hg). ? People who are overweight or obese. ? People who are African American.  If you are 16-65 years of age, have your blood pressure checked every 3-5 years. If you are 78 years of age or older, have your blood pressure checked every year. You should have your blood pressure measured twice-once when you are at a hospital or clinic, and once when you are not at a hospital or clinic. Record the average of the two measurements. To check your blood pressure when you are not at a hospital or clinic, you can use: ? An automated blood pressure machine at a pharmacy. ? A home blood pressure monitor.  If you are between 26 years and 24 years old, ask your health care provider if you should take aspirin to prevent  strokes.  Have regular diabetes screenings. This involves taking a blood sample to check your fasting blood sugar level. ? If you are at a normal weight and have a low risk for diabetes, have this test once every three years after 56 years of age. ? If you are overweight and have a high risk for diabetes, consider being tested at a younger age or more often. Preventing infection Hepatitis B  If you have a higher risk for hepatitis B, you should be screened for this virus. You are considered at high risk for hepatitis B if: ? You were born in a country where hepatitis B is common. Ask your health care provider which countries are considered high risk. ? Your parents were born in a high-risk country, and you have not been immunized against hepatitis B (hepatitis B vaccine). ? You have HIV or AIDS. ? You use needles to inject street drugs. ? You live with someone who has hepatitis B. ? You have had sex with someone who has hepatitis B. ? You get hemodialysis treatment. ? You take certain medicines for conditions, including cancer, organ transplantation, and autoimmune conditions. Hepatitis C  Blood testing is recommended for: ? Everyone born from [birth year] through 1963. ? Anyone with known risk factors for hepatitis C. Sexually transmitted infections (STIs)  You should be screened for sexually transmitted infections (STIs) including gonorrhea and chlamydia if: ? You are sexually active and are younger than 56 years of age. ? You are older than 56 years of age and your health care provider tells you that you are at risk for this type of infection. ? Your sexual activity has changed since you were last screened and you are at an increased risk for chlamydia or gonorrhea. Ask your health care provider if you are at risk.  If you do not have HIV, but are at risk, it may be recommended that you take a prescription medicine daily to prevent HIV infection. This is called pre-exposure prophylaxis  (PrEP). You are considered at risk if: ? You are sexually active and do not regularly use condoms or know the HIV status of your partner(s). ? You take drugs by injection. ? You are sexually active with a partner who has HIV. Talk with your health care provider about whether you are at high risk of being infected with HIV. If you choose to begin PrEP, you should first be tested for HIV. You should then be tested every 3  months for as long as you are taking PrEP. Pregnancy  If you are premenopausal and you may become pregnant, ask your health care provider about preconception counseling.  If you may become pregnant, take 400 to 800 micrograms (mcg) of folic acid every day.  If you want to prevent pregnancy, talk to your health care provider about birth control (contraception). Osteoporosis and menopause  Osteoporosis is a disease in which the bones lose minerals and strength with aging. This can result in serious bone fractures. Your risk for osteoporosis can be identified using a bone density scan.  If you are 23 years of age or older, or if you are at risk for osteoporosis and fractures, ask your health care provider if you should be screened.  Ask your health care provider whether you should take a calcium or vitamin D supplement to lower your risk for osteoporosis.  Menopause may have certain physical symptoms and risks.  Hormone replacement therapy may reduce some of these symptoms and risks. Talk to your health care provider about whether hormone replacement therapy is right for you. Follow these instructions at home:  Schedule regular health, dental, and eye exams.  Stay current with your immunizations.  Do not use any tobacco products including cigarettes, chewing tobacco, or electronic cigarettes.  If you are pregnant, do not drink alcohol.  If you are breastfeeding, limit how much and how often you drink alcohol.  Limit alcohol intake to no more than 1 drink per day for  nonpregnant women. One drink equals 12 ounces of beer, 5 ounces of wine, or 1 ounces of hard liquor.  Do not use street drugs.  Do not share needles.  Ask your health care provider for help if you need support or information about quitting drugs.  Tell your health care provider if you often feel depressed.  Tell your health care provider if you have ever been abused or do not feel safe at home. This information is not intended to replace advice given to you by your health care provider. Make sure you discuss any questions you have with your health care provider. Document Released: 04/10/2011 Document Revised: 03/02/2016 Document Reviewed: 06/29/2015 Elsevier Interactive Patient Education  2019 [employer].

## 2019-03-18 ENCOUNTER — Encounter: Payer: Self-pay | Admitting: Family Medicine

## 2019-03-18 LAB — COMPREHENSIVE METABOLIC PANEL
ALT: 10 U/L (ref 0–35)
AST: 18 U/L (ref 0–37)
Albumin: 4 g/dL (ref 3.5–5.2)
Alkaline Phosphatase: 85 U/L (ref 39–117)
BUN: 12 mg/dL (ref 6–23)
CO2: 28 mEq/L (ref 19–32)
Calcium: 9.6 mg/dL (ref 8.4–10.5)
Chloride: 104 mEq/L (ref 96–112)
Creatinine, Ser: 0.72 mg/dL (ref 0.40–1.20)
GFR: 101.42 mL/min (ref >60.00)
Glucose, Bld: 79 mg/dL (ref 70–99)
Potassium: 4 mEq/L (ref 3.5–5.1)
Sodium: 140 mEq/L (ref 135–145)
Total Bilirubin: 0.8 mg/dL (ref 0.2–1.2)
Total Protein: 6.6 g/dL (ref 6.0–8.3)

## 2019-03-18 LAB — CBC
HCT: 43.6 % (ref 36.0–46.0)
Hemoglobin: 14.1 g/dL (ref 12.0–15.0)
MCHC: 32.3 g/dL (ref 30.0–36.0)
MCV: 90.6 fl (ref 78.0–100.0)
Platelets: 174 10*3/uL (ref 150.0–400.0)
RBC: 4.81 Mil/uL (ref 3.87–5.11)
RDW: 15 % (ref 11.5–15.5)
WBC: 6.6 10*3/uL (ref 4.0–10.5)

## 2019-03-18 LAB — LIPID PANEL
Cholesterol: 181 mg/dL (ref 0–200)
HDL: 55.9 mg/dL (ref >39.00)
LDL Cholesterol: 102 mg/dL — ABNORMAL HIGH (ref 0–99)
NonHDL: 125.18
Total CHOL/HDL Ratio: 3
Triglycerides: 115 mg/dL (ref 0.0–149.0)
VLDL: 23 mg/dL (ref 0.0–40.0)

## 2019-03-18 LAB — HEPATITIS B SURFACE ANTIBODY, QUANTITATIVE: Hep B S AB Quant (Post): <5 m[IU]/mL — ABNORMAL LOW (ref >=10)

## 2019-03-18 LAB — HEMOGLOBIN A1C: Hgb A1c MFr Bld: 5.6 % (ref 4.6–6.5)

## 2019-03-19 LAB — CERVICOVAGINAL ANCILLARY ONLY
Bacterial vaginitis: POSITIVE — AB
Candida vaginitis: POSITIVE — AB
Chlamydia: NEGATIVE
Neisseria Gonorrhea: NEGATIVE
Trichomonas: NEGATIVE

## 2019-03-20 ENCOUNTER — Encounter: Payer: Self-pay | Admitting: Family Medicine

## 2019-03-20 LAB — CYTOLOGY - PAP
Diagnosis: NEGATIVE
HPV: NOT DETECTED

## 2019-03-20 MED ORDER — METRONIDAZOLE 500 MG PO TABS: 500.0000 mg | ORAL_TABLET | Freq: Two times a day (BID) | ORAL | 0 refills | Status: DC

## 2019-03-20 NOTE — Addendum Note (Signed)
Addended by: Lamar Blinks C on: 03/20/2019 09:22 PM   Modules accepted: Orders

## 2019-03-31 ENCOUNTER — Ambulatory Visit (HOSPITAL_BASED_OUTPATIENT_CLINIC_OR_DEPARTMENT_OTHER): Payer: PRIVATE HEALTH INSURANCE

## 2019-04-01 ENCOUNTER — Ambulatory Visit (HOSPITAL_BASED_OUTPATIENT_CLINIC_OR_DEPARTMENT_OTHER)
Admission: RE | Admit: 2019-04-01 | Discharge: 2019-04-01 | Disposition: A | Discharge status: Home / Self Care | Payer: PRIVATE HEALTH INSURANCE | Source: Ambulatory Visit | Attending: Family Medicine | Admitting: Family Medicine

## 2019-04-01 ENCOUNTER — Other Ambulatory Visit: Payer: Self-pay

## 2019-04-01 ENCOUNTER — Encounter (HOSPITAL_BASED_OUTPATIENT_CLINIC_OR_DEPARTMENT_OTHER): Payer: Self-pay

## 2019-04-01 DIAGNOSIS — Z1239 Encounter for other screening for malignant neoplasm of breast: Secondary | ICD-10-CM | POA: Diagnosis present

## 2019-04-01 DIAGNOSIS — Z1231 Encounter for screening mammogram for malignant neoplasm of breast: Secondary | ICD-10-CM | POA: Diagnosis not present

## 2019-05-25 ENCOUNTER — Other Ambulatory Visit: Payer: Self-pay | Admitting: Medical

## 2019-06-26 DIAGNOSIS — H2703 Aphakia, bilateral: Secondary | ICD-10-CM | POA: Insufficient documentation

## 2019-06-26 NOTE — Progress Notes (Signed)
[place] [facility] [address] [place], [place] [address] [phone]  Date:  06/30/2019   Name:  Joanne Kim   DOB:  1961/08/07   MRN:  [id number]  PCP:  [name], MD    Chief Complaint: No chief complaint on file.   History of Present Illness:  Joanne Kim is a 58 y.o. very pleasant female patient who presents with the following:  Generally healthy patient here today with concern of not feeling well- doing a virtual visit Pt location is home, provider is at office Pt ID confirmed with 2 factors, she gives consent for virtual visit today   I last saw her in June for a physical She has been seen by ophthalmology for open angle glaucoma- bilateral  Flu vaccine: Colon cancer screening due next year.  Can offer Cologuard if she would like Labs up-to-date  Today she notes aches, "sympoms of a sinus infection," sinus congestion, headache and sensitive teeth  She contacted an urgent care last week due to SOB They did not see her but sent her for covid testing which was negative She reports that she was not seen, did not have any exam or eval except for covid test She notes that she is having SOB, and she was having chest pain last week as well She did not have any chest pain today- but she did have CP over the weekend.   She continues to feel SOB and notes that she has "gained 30 lbs"  She is dropping her grand-daughter off right now, she will go to the ER after she drops her off.  The closest ER to her is [place].  She feels that she is ok to drive  She does not have a fever  Patient Active Problem List   Diagnosis Date Noted  . 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 Dry eyes 10/23/2016  . Obesity 10/23/2016  . RECTAL BLEEDING 12/14/2009    Past Medical History:  Diagnosis Date  . Elevated blood pressure reading   . History of colon polyps   . Vaginal Pap smear, abnormal 2017     Past Surgical History:  Procedure Laterality Date  . BREAST BIOPSY    . bunyonectomy      Social History   Tobacco Use  . Smoking status: Never Smoker  . Smokeless tobacco: Never Used  Substance Use Topics  . Alcohol use: Yes    Comment: occasionally  . Drug use: No    Family History  Problem Relation Age of Onset  . Heart disease Other   . Stroke Other   . Hypertension Other   . Breast cancer Maternal Aunt     Allergies  Allergen Reactions  . Oxycodone Itching    Medication list has been reviewed and updated.  Current Outpatient Medications on File Prior to Visit  Medication Sig Dispense Refill  . Azelastine HCl 137 MCG/SPRAY SOLN Place 2 sprays into both nostrils 2 (two) times daily as needed. 30 mL 5  . fluconazole (DIFLUCAN) 150 MG tablet TAKE 1 TABLET (150 MG TOTAL) BY MOUTH ONCE FOR 1 DOSE. REPEAT WEEKLY AS NEEDED    . levocetirizine (XYZAL) 5 MG tablet TAKE 1 TABLET BY MOUTH EVERY DAY IN THE EVENING 30 tablet 5  . metroNIDAZOLE (FLAGYL) 500 MG tablet Take 1 tablet (500 mg total) by mouth 2 (two) times daily. 14 tablet 0  . montelukast (SINGULAIR) 10 MG tablet  Take 1 tablet (10 mg total) by mouth at bedtime. Use as needed for allergies 30 tablet 6  . Olopatadine HCl 0.2 % SOLN Apply one drop to each eye daily 2.5 mL 6  . UNABLE TO FIND Med Name: [name]     No current facility-administered medications on file prior to visit.     Review of Systems:  As per HPI- otherwise negative.   Physical Examination: There were no vitals filed for this visit. There were no vitals filed for this visit. There is no height or weight on file to calculate BMI. Ideal Body Weight:    Pt observed over video- she looks well No cough, SOB or distress is evident    Assessment and Plan: SOB (shortness of breath)  Aphakic open-angle glaucoma, bilateral  Chest pain, unspecified type  Pt had scheduled a virtual visit today due to "sinus congestion" but today also  has complaint of SOB and chest pain- CP is not active but SOB is.  She was tested for covid last week but has not been seen for an exam Advised her to go to the closes ER as soon as she for further evaluation and she agrees to do so   Signed Lamar Blinks, MD

## 2019-06-26 NOTE — Patient Instructions (Signed)
It was good to see you again today!   

## 2019-06-27 ENCOUNTER — Other Ambulatory Visit: Payer: Self-pay | Admitting: Internal Medicine

## 2019-06-30 ENCOUNTER — Encounter (HOSPITAL_BASED_OUTPATIENT_CLINIC_OR_DEPARTMENT_OTHER): Payer: Self-pay | Admitting: *Deleted

## 2019-06-30 ENCOUNTER — Encounter: Payer: Self-pay | Admitting: Family Medicine

## 2019-06-30 ENCOUNTER — Emergency Department (HOSPITAL_BASED_OUTPATIENT_CLINIC_OR_DEPARTMENT_OTHER)
Admission: EM | Admit: 2019-06-30 | Discharge: 2019-06-30 | Disposition: A | Discharge status: Home / Self Care | Payer: PRIVATE HEALTH INSURANCE | Attending: Emergency Medicine | Admitting: Emergency Medicine

## 2019-06-30 ENCOUNTER — Ambulatory Visit (INDEPENDENT_AMBULATORY_CARE_PROVIDER_SITE_OTHER): Payer: PRIVATE HEALTH INSURANCE | Admitting: Family Medicine

## 2019-06-30 ENCOUNTER — Other Ambulatory Visit: Payer: Self-pay

## 2019-06-30 ENCOUNTER — Emergency Department (HOSPITAL_BASED_OUTPATIENT_CLINIC_OR_DEPARTMENT_OTHER): Payer: PRIVATE HEALTH INSURANCE

## 2019-06-30 DIAGNOSIS — H4010X0 Unspecified open-angle glaucoma, stage unspecified: Secondary | ICD-10-CM | POA: Diagnosis not present

## 2019-06-30 DIAGNOSIS — J019 Acute sinusitis, unspecified: Secondary | ICD-10-CM | POA: Diagnosis not present

## 2019-06-30 DIAGNOSIS — R0789 Other chest pain: Secondary | ICD-10-CM

## 2019-06-30 DIAGNOSIS — R079 Chest pain, unspecified: Secondary | ICD-10-CM

## 2019-06-30 DIAGNOSIS — Z885 Allergy status to narcotic agent status: Secondary | ICD-10-CM | POA: Diagnosis not present

## 2019-06-30 DIAGNOSIS — R05 Cough: Secondary | ICD-10-CM | POA: Diagnosis not present

## 2019-06-30 DIAGNOSIS — Z79899 Other long term (current) drug therapy: Secondary | ICD-10-CM | POA: Diagnosis not present

## 2019-06-30 DIAGNOSIS — R0602 Shortness of breath: Secondary | ICD-10-CM

## 2019-06-30 DIAGNOSIS — H2703 Aphakia, bilateral: Secondary | ICD-10-CM | POA: Diagnosis not present

## 2019-06-30 LAB — URINALYSIS, ROUTINE W REFLEX MICROSCOPIC
Bilirubin Urine: NEGATIVE
Glucose, UA: NEGATIVE mg/dL
Hgb urine dipstick: NEGATIVE
Ketones, ur: NEGATIVE mg/dL
Leukocytes,Ua: NEGATIVE
Nitrite: NEGATIVE
Protein, ur: NEGATIVE mg/dL
Specific Gravity, Urine: 1.01 (ref 1.005–1.030)
pH: 7 (ref 5.0–8.0)

## 2019-06-30 MED ORDER — AMOXICILLIN-POT CLAVULANATE 875-125 MG PO TABS: 1.0000 | ORAL_TABLET | Freq: Two times a day (BID) | ORAL | 0 refills | Status: DC

## 2019-06-30 MED FILL — AMOX-CLAV 875-125 MG TABLET: 875-125 | 7 days supply | Qty: 14 | Fill #0

## 2019-06-30 NOTE — ED Provider Notes (Signed)
[facility] EMERGENCY DEPARTMENT Provider Note   CSN: [id number] Arrival date & time: 06/30/19  1344     History   Chief Complaint Chief Complaint  Patient presents with   Chest Pain   Cough   Generalized Body Aches    HPI Joanne Kim is a 58 y.o. female who presents for evaluation of body aches, general malaise malaise, sinus pressure, congestion, cough that is been ongoing for about a week.  She also reports that last week at the onset of symptoms, she had some chest pain and trouble breathing.  When she had the chest pain, she described it as a "heaviness and tightness."  She describes it as feeling like "pulling a muscle in her chest."  She felt like it was worse with movement, bending and twisting.  She states she additionally felt like she had some shortness of breath associated with it.  She states that with the chest pain, she did not have any diaphoresis, nausea/vomiting.  It is not worse with exertion.  She does report that it is worse with coughing and with deep inspiration.  Patient states that she also had some congestion and sinus pressure that she felt like it was consistent with a sinus infection which she is prone to this time of year.  She did not notice any fevers.  She was seen at urgent care where she had a COVID test that resulted as negative.  Patient reports that she was still having congestion, sinus pressure so she made an appointment with her PCP today.  She states that she currently does not have any chest pain or difficulty breathing and states that that had resolved about 2 days ago.  Her PCP advised her to go to the emergency department for further evaluation to ensure that there was no other source of her symptoms.  She denies any fevers, abdominal pain, nausea/vomiting, leg swelling.  She reports she used to have history of high blood pressure but states it is not been controlled.  No history of diabetes.  No personal cardiac history.  No  personal history of MIs before the age of 7.  She denies any smoking, illicit drug use. She denies any OCP use, recent immobilization, prior history of DVT/PE, recent surgery, leg swelling, or long travel.     The history is provided by the patient.    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 Authorizing Provider  amoxicillin-clavulanate (AUGMENTIN) 875-125 MG tablet Take 1 tablet by  mouth every 12 (twelve) hours. 06/30/19   Volanda Napoleon, PA-C  Azelastine HCl 137 MCG/SPRAY SOLN Place 2 sprays into both nostrils 2 (two) times daily as needed. 06/27/19   Copland, Gay Filler, MD  fluconazole (DIFLUCAN) 150 MG tablet TAKE 1 TABLET (150 MG TOTAL) BY MOUTH ONCE FOR 1 DOSE. REPEAT WEEKLY AS NEEDED 03/06/19   [provider]  levocetirizine (XYZAL) 5 MG tablet TAKE 1 TABLET BY MOUTH EVERY DAY IN THE EVENING 05/27/19   Copland, Gay Filler, MD  metroNIDAZOLE (FLAGYL) 500 MG tablet Take 1 tablet (500 mg total) by mouth 2 (two) times daily. 03/20/19   Copland, Gay Filler, MD  montelukast (SINGULAIR) 10 MG tablet Take 1 tablet (10 mg total) by mouth at bedtime. Use as needed for allergies 11/20/18   Colon Branch, MD  Olopatadine HCl 0.2 % SOLN Apply one drop to each eye daily 11/20/18   Colon Branch, MD  UNABLE TO FIND Med Name: [facility]     [provider]    Family History Family History  Problem Relation Age of Onset   Heart disease Other    Stroke Other    Hypertension Other    Breast cancer Maternal Aunt     Social History Social History   Tobacco Use   Smoking status: Never Smoker   Smokeless tobacco: Never Used  Substance Use Topics   Alcohol use: Yes    Comment: occasionally   Drug use: No     Allergies   Oxycodone   Review of Systems Review of Systems  Constitutional: Negative for fever.  HENT: Positive for congestion and sinus pressure.   Respiratory: Positive for cough and shortness of breath (resolved).   Cardiovascular: Positive for chest pain (resolvec). Negative for leg swelling.  Gastrointestinal: Negative for abdominal pain, nausea and vomiting.  Genitourinary: Negative for dysuria and hematuria.  Musculoskeletal: Positive for myalgias.  Neurological: Negative for headaches.  All other systems reviewed and are negative.    Physical Exam Updated Vital Signs BP 128/83 (BP Location: Right Arm)    Pulse 75    Temp 98.9 F (37.2 C) (Oral)    Resp 16    Ht 5\' 9"  (1.753 m)    Wt 106.6 kg    SpO2 99%    BMI 34.70 kg/m   Physical Exam Vitals signs and nursing note reviewed.  Constitutional:      Appearance: Normal appearance. She is well-developed.  HENT:     Head: Normocephalic and atraumatic.     Nose:     Right Sinus: Maxillary sinus tenderness and frontal sinus tenderness present.     Left Sinus: Maxillary sinus tenderness and frontal sinus tenderness present.     Comments: Erythematous turbinates noted bilaterally. Eyes:     General: Lids are normal.     Conjunctiva/sclera: Conjunctivae normal.     Pupils: Pupils are equal, round, and reactive to light.  Neck:     Musculoskeletal: Full passive range of motion without pain.  Cardiovascular:     Rate and Rhythm: Normal rate and regular rhythm.     Pulses: Normal pulses.          Radial pulses are 2+ on the  right side and 2+ on the left side.       Dorsalis pedis pulses are 2+ on the right side and 2+ on the left side.     Heart sounds: Normal heart sounds. No murmur. No friction rub. No gallop.  Pulmonary:     Effort: Pulmonary effort is normal.     Breath sounds: Normal breath sounds.     Comments: Lungs clear to auscultation bilaterally.  Symmetric chest rise.  No wheezing, rales, rhonchi. No evidence of respiratory distress. Abdominal:     Palpations: Abdomen is soft. Abdomen is not rigid.     Tenderness: There is no abdominal tenderness. There is no guarding.     Comments: Lungs clear to auscultation bilaterally.  Symmetric chest rise.  No wheezing, rales, rhonchi.  Musculoskeletal: Normal range of motion.     Comments: Bilateral lower extremities are symmetric in appearance without any overlying warmth, erythema, edema.  Skin:    General: Skin is warm and dry.     Capillary Refill: Capillary refill takes less than 2 seconds.  Neurological:     Mental Status: She is alert and oriented to person, place, and time.  Psychiatric:        Speech: Speech normal.      ED Treatments / Results  Labs (all labs ordered are listed, but only abnormal results are displayed) Labs Reviewed  URINALYSIS, ROUTINE W REFLEX MICROSCOPIC    EKG EKG Interpretation  Date/Time:  Monday June 30 2019 13:50:57 EDT Ventricular Rate:  79 PR Interval:  142 QRS Duration: 84 QT Interval:  378 QTC Calculation: 433 R Axis:   32 Text Interpretation:  Normal sinus rhythm Normal ECG Confirmed by Fredia Sorrow [phone]) on 06/30/2019 1:56:29 PM   Radiology Dg Chest Port 1 View  Result Date: 06/30/2019 CLINICAL DATA:  Chest for 5 days. EXAM: PORTABLE CHEST 1 VIEW COMPARISON:  Chest x-ray dated 11/06/2015 FINDINGS: The heart size and mediastinal contours are within normal limits. Both lungs are clear. The visualized skeletal structures are unremarkable. IMPRESSION: No active disease.  No evidence of  pneumonia or pulmonary edema. Electronically Signed   By: Franki Cabot M.D.   On: 06/30/2019 14:59    Procedures Procedures (including critical care time)  Medications Ordered in ED Medications - No data to display   Initial Impression / Assessment and Plan / ED Course  I have reviewed the triage vital signs and the nursing notes.  Pertinent labs & imaging results that were available during my care of the patient were reviewed by me and considered in my medical decision making (see chart for details).        58 year old female who presents for evaluation of congestion, cough, sinus pressure.  Also reports last week, she had some chest pain and shortness of breath that has since resolved.  Called her PCP who advised to come to emergency department.  Initial ED arrival, she is afebrile, nontoxic appearing, sitting comfortably in bed.  Vital signs are stable.  She currently denies any chest pain, difficulty breathing.  On exam, she does have some sinus pressure bilaterally.  Lungs clear to auscultation.  Suspect infectious etiology.  Chest x-ray, EKG ordered at triage.  At this time, I doubt that this is ACS etiology.  It sounds very atypical.  Additionally, she currently denies any chest pain or trouble breathing.   EKG reviewed.  Chest x-ray negative for any acute infectious etiology.   At this time, she is hemodynamically stable, well-appearing.  She does not have any complaints of chest pain or trouble breathing at this time.  Her only complaint now is sinus pressure, cough, congestion.  At this time, I doubt that this is ACS etiology as she has a very atypical presentation.  Additionally, her  EKG is very reassuring.  I do not feel she needs further work-up for ACS etiology. Discussed patient with Dr. Sherry Ruffing who is agreeable to plan.  Given her sinus pressure and that her symptoms been ongoing for a week I will plan to treat as sinusitis.  Discussed results with patient.  She is in  agreement to plan.  Patient is hemodynamically stable. At this time, patient exhibits no emergent life-threatening condition that require further evaluation in ED or admission. Patient had ample opportunity for questions and discussion. All patient's questions were answered with full understanding. Strict return precautions discussed. Patient expresses understanding and agreement to plan.   Portions of this note were generated with [employer]. Dictation errors may occur despite best attempts at proofreading.   Final Clinical Impressions(s) / ED Diagnoses   Final diagnoses:  Acute sinusitis, recurrence not specified, unspecified location  Atypical chest pain    ED Discharge Orders         Ordered    amoxicillin-clavulanate (AUGMENTIN) 875-125 MG tablet  Every 12 hours     06/30/19 1701           Desma Mcgregor 06/30/19 2152    Tegeler, Gwenyth Allegra, MD 07/01/19 [phone]

## 2019-06-30 NOTE — Discharge Instructions (Signed)
Take antibiotics as directed. Please take all of your antibiotics until finished.  You can take Tylenol or Ibuprofen as directed for pain. You can alternate Tylenol and Ibuprofen every 4 hours. If you take Tylenol at 1pm, then you can take Ibuprofen at 5pm. Then you can take Tylenol again at 9pm.   Follow-up with your primary care doctor.  Return to the Emergency Department immediately if you experiencing worsening chest pain, difficulty breathing, nausea/vomiting, get very sweaty, headache or any other worsening or concerning symptoms.

## 2019-06-30 NOTE — ED Triage Notes (Signed)
Chest pain x 5 days. Cough, sob, body aches, headache, congestion. She feels like she has a sinus infection. She had a negative Covid test last week. She feels like she has a pulled muscle in her chest.

## 2019-07-01 NOTE — Progress Notes (Signed)
[place] at [facility] [address], [place], [place] [address] [phone]  Date:  07/03/2019   Name:  Joanne Kim   DOB:  Feb 24, 1961   MRN:  [id number]  PCP:  [name], MD    Chief Complaint: No chief complaint on file.   History of Present Illness:  Joanne Kim is a 57 y.o. very pleasant female patient who presents with the following:  Patient with history of glaucoma, obesity.  Here today to discuss weight management Pt location is home, provider is at office Pt ID confirmed with 2 factors, she gives consent for virtual visit today We actually had a virtual visit earlier this week, at that point she described contacting urgent care over the weekend due to not feeling well.  She had a COVID test which is negative, but continued to complain of chest pain shortness of breath.  I sent her to the ER for evaluation on 9/21.  She was evaluated and released to home with Augmentin for sinusitis She notes that she is feeling much better   Wt Readings from Last 3 Encounters:  06/30/19 235 lb (106.6 kg)  03/17/19 230 lb (104.3 kg)  12/11/18 231 lb 0.6 oz (104.8 kg)   Pt notes that she has been trying to lose weight for 2-3 months (or longer) to lose weight BMI about 33.9 She is in menopause - LMP was a year or so  She was 217 in 10/2017 She has tried weight watchers, something called "it works" OTC  Last thyroid level in 2018 She tried diet and intermittent fasting, keto She gets more back pain when she gains weight as well  No personal or family history of thyroid cancer or MEN We discussed saxenda and she would be interested in trying this perhaps.   BP Readings from Last 3 Encounters:  06/30/19 128/83  03/17/19 118/72  12/11/18 115/76     Patient Active Problem List   Diagnosis Date Noted  . 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 Dry eyes 10/23/2016  . Obesity 10/23/2016  .  RECTAL BLEEDING 12/14/2009    Past Medical History:  Diagnosis Date  . Elevated blood pressure reading   . History of colon polyps   . Vaginal Pap smear, abnormal 2017    Past Surgical History:  Procedure Laterality Date  . BREAST BIOPSY    . bunyonectomy      Social History   Tobacco Use  . Smoking status: Never Smoker  . Smokeless tobacco: Never Used  Substance Use Topics  . Alcohol use: Yes    Comment: occasionally  . Drug use: No    Family History  Problem Relation Age of Onset  . Heart disease Other   . Stroke Other   . Hypertension Other   . Breast cancer Maternal Aunt     Allergies  Allergen Reactions  . Oxycodone Itching    Medication list has been reviewed and updated.  Current Outpatient Medications on File Prior to Visit  Medication Sig Dispense Refill  . amoxicillin-clavulanate (AUGMENTIN) 875-125 MG tablet Take 1 tablet by mouth every 12 (twelve) hours. 14 tablet 0  . Azelastine HCl 137 MCG/SPRAY SOLN Place 2 sprays into both nostrils 2 (two) times daily as needed. 30 mL 5  . fluconazole (DIFLUCAN) 150 MG tablet TAKE 1 TABLET (150 MG TOTAL) BY MOUTH ONCE FOR 1 DOSE. REPEAT WEEKLY AS NEEDED    . levocetirizine (XYZAL)  5 MG tablet TAKE 1 TABLET BY MOUTH EVERY DAY IN THE EVENING 30 tablet 5  . metroNIDAZOLE (FLAGYL) 500 MG tablet Take 1 tablet (500 mg total) by mouth 2 (two) times daily. 14 tablet 0  . montelukast (SINGULAIR) 10 MG tablet Take 1 tablet (10 mg total) by mouth at bedtime. Use as needed for allergies 30 tablet 6  . Olopatadine HCl 0.2 % SOLN Apply one drop to each eye daily 2.5 mL 6  . UNABLE TO FIND Med Name: [name]     No current facility-administered medications on file prior to visit.     Review of Systems:  As per HPI- otherwise negative.   Physical Examination: There were no vitals filed for this visit. There were no vitals filed for this visit. There is no height or weight on file to calculate BMI. Ideal Body Weight:    Pt observed over video today -she looks well, her normal self.  No cough, shortness of breath, distress is noted   Assessment and Plan: Weight gain - Plan: TSH   Virtual visit today to discuss weight gain and difficulty with weight loss.  I will order thyroid level for her to have done at her convenience.  We discussed various strategies for weight loss.  Given her moderately high BMI and otherwise good health, I do not think she would qualify for weight loss surgery.  She is not really interested in such extreme option at this time anyway.  We discussed phentermine, but she has borderline high blood pressure.  We also discussed Saxenda, she might be interested in trying this medication.  She will look at their website and ask her insurance card about coverage.  She will let me know if this is something she would like to try-in that case I will mail her an Rx and savings card Signed Lamar Blinks, MD

## 2019-07-03 ENCOUNTER — Encounter: Payer: Self-pay | Admitting: Family Medicine

## 2019-07-03 ENCOUNTER — Ambulatory Visit (INDEPENDENT_AMBULATORY_CARE_PROVIDER_SITE_OTHER): Payer: PRIVATE HEALTH INSURANCE | Admitting: Family Medicine

## 2019-07-03 ENCOUNTER — Other Ambulatory Visit: Payer: Self-pay

## 2019-07-03 DIAGNOSIS — R635 Abnormal weight gain: Secondary | ICD-10-CM

## 2019-07-03 DIAGNOSIS — E6609 Other obesity due to excess calories: Secondary | ICD-10-CM

## 2019-07-03 DIAGNOSIS — N898 Other specified noninflammatory disorders of vagina: Secondary | ICD-10-CM

## 2019-07-03 MED ORDER — SAXENDA 18 MG/3ML ~~LOC~~ SOPN: 0.6000 mg | PEN_INJECTOR | Freq: Every day | SUBCUTANEOUS | 3 refills | Status: DC

## 2019-07-09 MED ORDER — FLUCONAZOLE 150 MG PO TABS: ORAL_TABLET | ORAL | 0 refills | Status: DC

## 2019-07-09 NOTE — Addendum Note (Signed)
Addended by: Lamar Blinks C on: 07/09/2019 07:20 PM   Modules accepted: Orders

## 2019-07-22 ENCOUNTER — Encounter: Payer: Self-pay | Admitting: Family Medicine

## 2019-07-23 MED ORDER — FLUCONAZOLE 150 MG PO TABS: 150.0000 mg | ORAL_TABLET | Freq: Once | ORAL | 0 refills | Status: AC

## 2019-07-23 MED ORDER — AMOXICILLIN 500 MG PO CAPS: 1000.0000 mg | ORAL_CAPSULE | Freq: Two times a day (BID) | ORAL | 0 refills | Status: DC

## 2019-07-25 ENCOUNTER — Encounter: Payer: Self-pay | Admitting: Family Medicine

## 2019-07-31 ENCOUNTER — Encounter: Payer: Self-pay | Admitting: Family Medicine

## 2019-08-01 MED ORDER — PREDNISONE 20 MG PO TABS: ORAL_TABLET | ORAL | 0 refills | Status: DC

## 2019-09-23 NOTE — Patient Instructions (Signed)
It was great to see you again today, happy holidays

## 2019-09-23 NOTE — Progress Notes (Signed)
[name] at [facility] [address] [place], [place] [address] [phone]  Date:  09/24/2019   Name:  Joanne Kim   DOB:  March 16, 1961   MRN:  [id number]  PCP:  [name], MD    Chief Complaint: No chief complaint on file.   History of Present Illness:  Joanne Kim is a 58 y.o. very pleasant female patient who presents with the following:  Generally healthy woman with history of glaucoma, here today with concern of a rash on her neck I saw her most recently in September of this year-at that point we discussed difficulty with weight loss  Flu vaccine- not done yet.  Encouraged her to have this done  Labs done in June Virtual visit today due to pandemic Pt location is her home Provider is at office  Pt and myself are on the call today. Confirmed her identity with 2 factors and she gives consent for a virtual viisit today  She had a covid test done 2 days ago- she is waiting on her results still 2 days ago she awoke with sx of "sinus infection," she went to UC and got tested for Covid as well as treated for a sinus infection with pred and triamcinolone for her rash She wore an infrequently worn necklace about a week ago, and felt some itching on the back of her neck She developed some red bumps, welts, itching along the distribution of the necklace.  We suspect it contains nickel, as it was not an expensive item  No fever- she is checking her temp No vomiting or diarrhea She notes that her BP was slightly elevated at urgent care-it has generally been fine for [place], she wonders if this is due to discomfort  The prednisone seems to be help with both her sinus symptoms and her rash, the rash is looking much better  BP Readings from Last 3 Encounters:  06/30/19 128/83  03/17/19 118/72  12/11/18 115/76     Patient Active Problem List   Diagnosis Date Noted  . Aphakic open-angle glaucoma, bilateral  06/26/2019  . Dry eyes 10/23/2016  . Obesity 10/23/2016  . RECTAL BLEEDING 12/14/2009    Past Medical History:  Diagnosis Date  . Elevated blood pressure reading   . History of colon polyps   . Vaginal Pap smear, abnormal 2017    Past Surgical History:  Procedure Laterality Date  . BREAST BIOPSY    . bunyonectomy      Social History   Tobacco Use  . Smoking status: Never Smoker  . Smokeless tobacco: Never Used  Substance Use Topics  . Alcohol use: Yes    Comment: occasionally  . Drug use: No    Family History  Problem Relation Age of Onset  . Heart disease Other   . Stroke Other   . Hypertension Other   . Breast cancer Maternal Aunt     Allergies  Allergen Reactions  . Oxycodone Itching    Medication list has been reviewed and updated.  Current Outpatient Medications on File Prior to Visit  Medication Sig Dispense Refill  . amoxicillin (AMOXIL) 500 MG capsule Take 2 capsules (1,000 mg total) by mouth 2 (two) times daily. 40 capsule 0  . amoxicillin-clavulanate (AUGMENTIN) 875-125 MG tablet Take 1 tablet by mouth every 12 (twelve) hours. 14 tablet 0  . Azelastine HCl 137 MCG/SPRAY SOLN Place 2 sprays into both nostrils 2 (two) times daily  as needed. 30 mL 5  . fluconazole (DIFLUCAN) 150 MG tablet TAKE 1 TABLET (150 MG TOTAL) BY MOUTH ONCE FOR 1 DOSE. REPEAT WEEKLY AS NEEDED 3 tablet 0  . levocetirizine (XYZAL) 5 MG tablet TAKE 1 TABLET BY MOUTH EVERY DAY IN THE EVENING 30 tablet 5  . Liraglutide -Weight Management (SAXENDA) 18 MG/3ML SOPN Inject 0.6 mg into the skin daily. Increase by 0.6 mg/day weekly to max dose of 3mg / daily 15 mL 3  . metroNIDAZOLE (FLAGYL) 500 MG tablet Take 1 tablet (500 mg total) by mouth 2 (two) times daily. 14 tablet 0  . montelukast (SINGULAIR) 10 MG tablet Take 1 tablet (10 mg total) by mouth at bedtime. Use as needed for allergies 30 tablet 6  . Olopatadine HCl 0.2 % SOLN Apply one drop to each eye daily 2.5 mL 6  . predniSONE  (DELTASONE) 20 MG tablet Take 40 mg daily for 3 days, then 20 mg daily for 3 days 9 tablet 0  . UNABLE TO FIND Med Name: [name]     No current facility-administered medications on file prior to visit.    Review of Systems:  As per HPI- otherwise negative.   Physical Examination: There were no vitals filed for this visit. There were no vitals filed for this visit. There is no height or weight on file to calculate BMI. Ideal Body Weight:    Pt observed over video monitor She looks well, her normal self.  No cough, wheezing, distress is noted Assessment and Plan: Rash  Sinus pain - Plan: amoxicillin (AMOXIL) 500 MG capsule  Vaginal itching - Plan: fluconazole (DIFLUCAN) 150 MG tablet  Virtual visit today to follow-up from recent urgent care visit.  Patient was seen in urgent care 2 days ago with symptoms of possible sinus infection.  She was tested for COVID-19, this result was pending She was started on treatment with prednisone and also triamcinolone for a rash on her neck.  All of her symptoms are somewhat improved, the rash is responding to treatment.  I advised her that if she has COVID-19, that is likely the cause of her sinus symptoms.  However if her Covid test is negative she may have a sinus infection, in that case she may fill and use amoxicillin prescription.  Also gave her Diflucan to use in case of antibiotic use  Discussed need to quarantine for 10 days if her Covid test is positive Patient will let me know if any other concerns     Signed Lamar Blinks, MD

## 2019-09-24 ENCOUNTER — Ambulatory Visit (INDEPENDENT_AMBULATORY_CARE_PROVIDER_SITE_OTHER): Payer: PRIVATE HEALTH INSURANCE | Admitting: Family Medicine

## 2019-09-24 ENCOUNTER — Encounter: Payer: Self-pay | Admitting: Family Medicine

## 2019-09-24 DIAGNOSIS — R21 Rash and other nonspecific skin eruption: Secondary | ICD-10-CM

## 2019-09-24 DIAGNOSIS — N898 Other specified noninflammatory disorders of vagina: Secondary | ICD-10-CM

## 2019-09-24 DIAGNOSIS — J3489 Other specified disorders of nose and nasal sinuses: Secondary | ICD-10-CM | POA: Diagnosis not present

## 2019-09-24 MED ORDER — FLUCONAZOLE 150 MG PO TABS: ORAL_TABLET | ORAL | 0 refills | Status: DC

## 2019-09-24 MED ORDER — AMOXICILLIN 500 MG PO CAPS: 1000.0000 mg | ORAL_CAPSULE | Freq: Two times a day (BID) | ORAL | 0 refills | Status: DC

## 2019-09-29 ENCOUNTER — Encounter: Payer: Self-pay | Admitting: Family Medicine

## 2019-09-29 ENCOUNTER — Other Ambulatory Visit: Payer: Self-pay | Admitting: Family Medicine

## 2019-10-01 MED ORDER — PREDNISONE 20 MG PO TABS: ORAL_TABLET | ORAL | 0 refills | Status: DC

## 2019-10-01 NOTE — Addendum Note (Signed)
Addended by: Lamar Blinks C on: 10/01/2019 05:33 AM   Modules accepted: Orders

## 2019-10-07 ENCOUNTER — Encounter: Payer: Self-pay | Admitting: Family Medicine

## 2019-10-22 ENCOUNTER — Other Ambulatory Visit: Payer: Self-pay

## 2019-10-22 NOTE — Progress Notes (Deleted)
[name] at [facility] [address] [place], [place] [address] [phone]  Date:  10/23/2019   Name:  Joanne Kim   DOB:  03-10-1961   MRN:  [id number]  PCP:  [name], MD    Chief Complaint: No chief complaint on file.   History of Present Illness:  Joanne Kim is a 59 y.o. very pleasant female patient who presents with the following:  Here today to discuss concern of recent sinus infection She was seen in urgent [facility], on December 14 She was tested for COVID-19 at that time, and again more recently due to exposure  She is otherwise generally healthy with history of glaucoma  Flu vaccine Can suggest Shingrix Most recent blood work in June Patient Active Problem List   Diagnosis Date Noted  . 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 Dry eyes 10/23/2016  . Obesity 10/23/2016  . RECTAL BLEEDING 12/14/2009    Past Medical History:  Diagnosis Date  . Elevated blood pressure reading   . History of colon polyps   . Vaginal Pap smear, abnormal 2017    Past Surgical History:  Procedure Laterality Date  . BREAST BIOPSY    . bunyonectomy      Social History   Tobacco Use  . Smoking status: Never Smoker  . Smokeless tobacco: Never Used  Substance Use Topics  . Alcohol use: Yes    Comment: occasionally  . Drug use: No    Family History  Problem Relation Age of Onset  . Heart disease Other   . Stroke Other   . Hypertension Other   . Breast cancer Maternal Aunt     Allergies  Allergen Reactions  . Oxycodone Itching    Medication list has been reviewed and updated.  Current Outpatient Medications on File Prior to Visit  Medication Sig Dispense Refill  . amoxicillin (AMOXIL) 500 MG capsule Take 2 capsules (1,000 mg total) by mouth 2 (two) times daily. 40 capsule 0  . Azelastine HCl 137 MCG/SPRAY SOLN Place 2 sprays into both nostrils 2 (two) times  daily as needed. 30 mL 5  . fluconazole (DIFLUCAN) 150 MG tablet TAKE 1 TABLET (150 MG TOTAL) BY MOUTH ONCE FOR 1 DOSE. REPEAT WEEKLY AS NEEDED 3 tablet 0  . levocetirizine (XYZAL) 5 MG tablet TAKE 1 TABLET BY MOUTH EVERY DAY IN THE EVENING 30 tablet 5  . Liraglutide -Weight Management (SAXENDA) 18 MG/3ML SOPN Inject 0.6 mg into the skin daily. Increase by 0.6 mg/day weekly to max dose of 3mg / daily 15 mL 3  . metroNIDAZOLE (FLAGYL) 500 MG tablet Take 1 tablet (500 mg total) by mouth 2 (two) times daily. 14 tablet 0  . montelukast (SINGULAIR) 10 MG tablet Take 1 tablet (10 mg total) by mouth at bedtime. Use as needed for allergies 30 tablet 6  . Olopatadine HCl 0.2 % SOLN Apply one drop to each eye daily 2.5 mL 6  . predniSONE (DELTASONE) 20 MG tablet Take 40 mg daily for 3 days, then 20 mg daily for 3 days 9 tablet 0  . UNABLE TO FIND Med Name: [name]     No current facility-administered medications on file prior to visit.    Review of Systems:  ***  Physical Examination: There were no vitals filed for this visit. There were no vitals filed for this visit. There is no height or weight on file to calculate BMI. Ideal  Body Weight:    ***  Assessment and Plan: *** This visit occurred during the SARS-CoV-2 public health emergency.  Safety protocols were in place, including screening questions prior to the visit, additional usage of staff PPE, and extensive cleaning of exam room while observing appropriate contact time as indicated for disinfecting solutions.    Signed Lamar Blinks, MD

## 2019-10-23 ENCOUNTER — Ambulatory Visit: Payer: PRIVATE HEALTH INSURANCE | Admitting: Family Medicine

## 2019-10-24 ENCOUNTER — Other Ambulatory Visit: Payer: Self-pay

## 2019-10-25 ENCOUNTER — Other Ambulatory Visit: Payer: Self-pay | Admitting: Internal Medicine

## 2019-10-25 DIAGNOSIS — J301 Allergic rhinitis due to pollen: Secondary | ICD-10-CM

## 2019-10-26 NOTE — Progress Notes (Addendum)
[place] at [facility] [address] [place], [place] [address] [phone]  Date:  10/27/2019   Name:  Joanne Kim   DOB:  1961/07/16   MRN:  [id number]  PCP:  [name], MD    Chief Complaint: Sinusitis   History of Present Illness:  Joanne Kim is a 59 y.o. very pleasant female patient who presents with the following:  History of obesity, glaucoma In person visit to follow-up on recent sinus infection/persistent shortness of breath Patient was seen at urgent care on 12/14 with concern of possible COVID-19, she was treated with prednisone.  Covid test was negative  I then saw her virtually on December 16 with concern for possible sinus infection, treated with amoxicillin She then went to an urgent care about 12 days and got an rx for doxycycline and had another covid test which was negative  Today she notes that she is "not 100%" She feels like her breathing is not getting any better -she continues to be short of breath, this has been stable for over a month.  Not getting worse No history of heart or lung problems in the past She feels very fatigued  Has some head congestion, sinus congestion No cough at this time   She notes that "I still feel so bad, I'm just depleted of everything" She is not checking her temperature but does not think she has a fever  She continues to have sinus congestion and pressure  She is not having chest pain- she just feels SOB, not worse with exercise  She has been trying to continue exercising  Wt Readings from Last 3 Encounters:  10/27/19 239 lb (108.4 kg)  06/30/19 235 lb (106.6 kg)  03/17/19 230 lb (104.3 kg)     Patient Active Problem List   Diagnosis Date Noted  . 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 Dry eyes 10/23/2016  . Obesity 10/23/2016  . RECTAL BLEEDING 12/14/2009    Past Medical History:  Diagnosis Date  . Elevated blood  pressure reading   . History of colon polyps   . Vaginal Pap smear, abnormal 2017    Past Surgical History:  Procedure Laterality Date  . BREAST BIOPSY    . bunyonectomy      Social History   Tobacco Use  . Smoking status: Never Smoker  . Smokeless tobacco: Never Used  Substance Use Topics  . Alcohol use: Yes    Comment: occasionally  . Drug use: No    Family History  Problem Relation Age of Onset  . Heart disease Other   . Stroke Other   . Hypertension Other   . Breast cancer Maternal Aunt     Allergies  Allergen Reactions  . Oxycodone Itching    Medication list has been reviewed and updated.  Current Outpatient Medications on File Prior to Visit  Medication Sig Dispense Refill  . Azelastine HCl 137 MCG/SPRAY SOLN Place 2 sprays into both nostrils 2 (two) times daily as needed. 30 mL 5  . fluconazole (DIFLUCAN) 150 MG tablet TAKE 1 TABLET (150 MG TOTAL) BY MOUTH ONCE FOR 1 DOSE. REPEAT WEEKLY AS NEEDED (Patient not taking: Reported on 10/27/2019) 3 tablet 0  . levocetirizine (XYZAL) 5 MG tablet TAKE 1 TABLET BY MOUTH EVERY DAY IN THE EVENING 30 tablet 5  . Liraglutide -Weight Management (SAXENDA) 18 MG/3ML SOPN Inject 0.6 mg into the skin daily. Increase by 0.6 mg/day  weekly to max dose of 3mg / daily 15 mL 3  . montelukast (SINGULAIR) 10 MG tablet TAKE 1 TABLET BY MOUTH AT BEDTIME USE AS NEEDED FOR ALLERGIES 30 tablet 1  . Olopatadine HCl 0.2 % SOLN Apply one drop to each eye daily 2.5 mL 6  . UNABLE TO FIND Med Name: [name]     No current facility-administered medications on file prior to visit.    Review of Systems:  As per HPI- otherwise negative.   Physical Examination: Vitals:   10/27/19 1622  BP: 122/82  Pulse: 75  Resp: 18  Temp: (!) 96.8 F (36 C)  SpO2: 98%   Vitals:   10/27/19 1622  Weight: 239 lb (108.4 kg)  Height: 5\' 9"  (1.753 m)   Body mass index is 35.29 kg/m. Ideal Body Weight: Weight in (lb) to have BMI = 25: 168.9  GEN:  WDWN, NAD, Non-toxic, A & O x 3, obese, looks well HEENT: Atraumatic, Normocephalic. Neck supple. No masses, No LAD.  Bilateral TM wnl, oropharynx normal.  PEERL,EOMI.   Ears and Nose: No external deformity. CV: RRR, No M/G/R. No JVD. No thrill. No extra heart sounds. PULM: CTA B, no wheezes, crackles, rhonchi. No retractions. No resp. distress. No accessory muscle use. ABD: S, NT, ND, +BS. No rebound. No HSM. EXTR: No c/c/e NEURO Normal gait.  PSYCH: Normally interactive. Conversant. Not depressed or anxious appearing.  Calm demeanor.   EKG: NSR, rate 78  No ST changes  Compared with 06/2019 no concerning change is noted  Assessment and Plan: SOB (shortness of breath) - Plan: DG Chest 2 View, EKG 12-Lead, CBC, Comprehensive metabolic panel, D-Dimer, Quantitative, albuterol (VENTOLIN HFA) 108 (90 Base) MCG/ACT inhaler  Sinus congestion - Plan: predniSONE (DELTASONE) 20 MG tablet  Shortness of breath - Plan: CT Angio Chest W/Cm &/Or Wo Cm  Positive D dimer - Plan: CT Angio Chest W/Cm &/Or Wo Cm  Here today with persistent symptoms of shortness of breath and sinus congestion following about 1 month of illness.  She has been tested for COVID-19 twice and been negative.  However, her symptoms are concerning for possible occult COVID-19 infection with persistent lung inflammation  She is not having any chest pain, EKG is normal Will obtain a plain chest film and labs as above today She understands that if D-dimer is positive we will plan to proceed to CT angiogram We will try another course of prednisone and also an albuterol inhaler for her symptoms  If any acute worsening she will seek emergency care Moderate medical decision making today  This visit occurred during the SARS-CoV-2 public health emergency.  Safety protocols were in place, including screening questions prior to the visit, additional usage of staff PPE, and extensive cleaning of exam room while observing appropriate contact  time as indicated for disinfecting solutions.    Signed Lamar Blinks, MD  Received her chest film as below DG Chest 2 View  Result Date: 10/27/2019 CLINICAL DATA:  59 year old female with shortness of breath. EXAM: CHEST - 2 VIEW COMPARISON:  Chest radiograph dated 06/30/2019. FINDINGS: The heart size and mediastinal contours are within normal limits. Both lungs are clear. The visualized skeletal structures are unremarkable. IMPRESSION: No active cardiopulmonary disease. Electronically Signed   By: Anner Crete M.D.   On: 10/27/2019 17:16   Message to patient, normal  addnd 1/19- received her  D dimer, it is positive Will order CT angiogram for her now CMP is ordered but still pending Message  to pt  I also spoke to the patient on the telephone at about 9:30 AM.  Apparently her insurance requires a prior authorization which we may not be able to obtain today.  This means CT may be delayed until tomorrow.  Her other option is to go to the emergency room.  At this time the patient feels comfortable waiting to have her CT tomorrow, I agree this is reasonable given 3 weeks of symptoms  The patient sent me a MyChart message later in the afternoon and I called her at 7:30 PM she did not receive any update from my staff today and was concerned.  I apologize for lack of communication, assured her that we will work on her CT first thing in the morning.  If she does not feel comfortable waiting, she may certainly go to the ER.  At this time she prefers to wait-I will contact her in the morning  Results for orders placed or performed in visit on 10/27/19  CBC  Result Value Ref Range   WBC 7.5 4.0 - 10.5 K/uL   RBC 4.98 3.87 - 5.11 Mil/uL   Platelets 181.0 150.0 - 400.0 K/uL   Hemoglobin 14.4 12.0 - 15.0 g/dL   HCT 44.4 36.0 - 46.0 %   MCV 89.1 78.0 - 100.0 fl   MCHC 32.3 30.0 - 36.0 g/dL   RDW 14.8 11.5 - 15.5 %  Comprehensive metabolic panel  Result Value Ref Range   Sodium 140 135 -  145 mEq/L   Potassium 4.3 3.5 - 5.1 mEq/L   Chloride 104 96 - 112 mEq/L   CO2 30 19 - 32 mEq/L   Glucose, Bld 85 70 - 99 mg/dL   BUN 8 6 - 23 mg/dL   Creatinine, Ser 0.90 0.40 - 1.20 mg/dL   Total Bilirubin 0.6 0.2 - 1.2 mg/dL   Alkaline Phosphatase 92 39 - 117 U/L   AST 20 0 - 37 U/L   ALT 11 0 - 35 U/L   Total Protein 7.3 6.0 - 8.3 g/dL   Albumin 4.0 3.5 - 5.2 g/dL   GFR 78.22 >60.00 mL/min   Calcium 9.7 8.4 - 10.5 mg/dL  D-Dimer, Quantitative  Result Value Ref Range   D-Dimer, Quant 0.64 (H) <0.50 mcg/mL FEU   Received her other labs as above

## 2019-10-27 ENCOUNTER — Encounter: Payer: Self-pay | Admitting: Family Medicine

## 2019-10-27 ENCOUNTER — Ambulatory Visit (INDEPENDENT_AMBULATORY_CARE_PROVIDER_SITE_OTHER): Payer: 59 | Admitting: Family Medicine

## 2019-10-27 ENCOUNTER — Ambulatory Visit (HOSPITAL_BASED_OUTPATIENT_CLINIC_OR_DEPARTMENT_OTHER)
Admission: RE | Admit: 2019-10-27 | Discharge: 2019-10-27 | Disposition: A | Discharge status: Home / Self Care | Payer: 59 | Source: Ambulatory Visit | Attending: Family Medicine | Admitting: Family Medicine

## 2019-10-27 ENCOUNTER — Other Ambulatory Visit: Payer: Self-pay

## 2019-10-27 VITALS — BP 122/82 | HR 75 | Temp 96.8°F | Resp 18 | Ht 69.0 in | Wt 239.0 lb

## 2019-10-27 DIAGNOSIS — R0602 Shortness of breath: Secondary | ICD-10-CM | POA: Insufficient documentation

## 2019-10-27 DIAGNOSIS — R7989 Other specified abnormal findings of blood chemistry: Secondary | ICD-10-CM

## 2019-10-27 DIAGNOSIS — R0981 Nasal congestion: Secondary | ICD-10-CM | POA: Diagnosis not present

## 2019-10-27 MED ORDER — PREDNISONE 20 MG PO TABS: ORAL_TABLET | ORAL | 0 refills | Status: DC

## 2019-10-27 MED ORDER — ALBUTEROL SULFATE HFA 108 (90 BASE) MCG/ACT IN AERS: 2.0000 | INHALATION_SPRAY | Freq: Four times a day (QID) | RESPIRATORY_TRACT | 2 refills | Status: DC | PRN

## 2019-10-27 NOTE — Patient Instructions (Signed)
We are doing to get a plain x-ray of your lungs today- please go to imaging on the ground floor right away- then you can go home I will be in touch with your labs tomorrow If D dimer is positve we will plan for a CT scan of your chest  For now, we will try an albuterol inhaler as needed and also a course of prednisone  Take care!

## 2019-10-28 ENCOUNTER — Encounter: Payer: Self-pay | Admitting: Family Medicine

## 2019-10-28 ENCOUNTER — Telehealth: Payer: Self-pay | Admitting: Family Medicine

## 2019-10-28 DIAGNOSIS — R0602 Shortness of breath: Secondary | ICD-10-CM

## 2019-10-28 LAB — COMPREHENSIVE METABOLIC PANEL
ALT: 11 U/L (ref 0–35)
AST: 20 U/L (ref 0–37)
Albumin: 4 g/dL (ref 3.5–5.2)
Alkaline Phosphatase: 92 U/L (ref 39–117)
BUN: 8 mg/dL (ref 6–23)
CO2: 30 mEq/L (ref 19–32)
Calcium: 9.7 mg/dL (ref 8.4–10.5)
Chloride: 104 mEq/L (ref 96–112)
Creatinine, Ser: 0.9 mg/dL (ref 0.40–1.20)
GFR: 78.22 mL/min (ref >60.00)
Glucose, Bld: 85 mg/dL (ref 70–99)
Potassium: 4.3 mEq/L (ref 3.5–5.1)
Sodium: 140 mEq/L (ref 135–145)
Total Bilirubin: 0.6 mg/dL (ref 0.2–1.2)
Total Protein: 7.3 g/dL (ref 6.0–8.3)

## 2019-10-28 LAB — CBC
HCT: 44.4 % (ref 36.0–46.0)
Hemoglobin: 14.4 g/dL (ref 12.0–15.0)
MCHC: 32.3 g/dL (ref 30.0–36.0)
MCV: 89.1 fl (ref 78.0–100.0)
Platelets: 181 10*3/uL (ref 150.0–400.0)
RBC: 4.98 Mil/uL (ref 3.87–5.11)
RDW: 14.8 % (ref 11.5–15.5)
WBC: 7.5 10*3/uL (ref 4.0–10.5)

## 2019-10-28 LAB — D-DIMER, QUANTITATIVE: D-Dimer, Quant: 0.64 mcg/mL FEU — ABNORMAL HIGH (ref <0.50)

## 2019-10-28 NOTE — Addendum Note (Signed)
Addended by: Lamar Blinks C on: 10/28/2019 06:54 AM   Modules accepted: Orders

## 2019-10-29 ENCOUNTER — Encounter (HOSPITAL_BASED_OUTPATIENT_CLINIC_OR_DEPARTMENT_OTHER): Payer: Self-pay

## 2019-10-29 ENCOUNTER — Ambulatory Visit (HOSPITAL_BASED_OUTPATIENT_CLINIC_OR_DEPARTMENT_OTHER)
Admission: RE | Admit: 2019-10-29 | Discharge: 2019-10-29 | Disposition: A | Discharge status: Home / Self Care | Payer: 59 | Source: Ambulatory Visit | Attending: Family Medicine | Admitting: Family Medicine

## 2019-10-29 ENCOUNTER — Other Ambulatory Visit: Payer: Self-pay

## 2019-10-29 DIAGNOSIS — R0602 Shortness of breath: Secondary | ICD-10-CM | POA: Diagnosis present

## 2019-10-29 DIAGNOSIS — R7989 Other specified abnormal findings of blood chemistry: Secondary | ICD-10-CM | POA: Insufficient documentation

## 2019-10-29 MED ORDER — IOHEXOL 350 MG/ML SOLN
100.0000 mL | Freq: Once | INTRAVENOUS | Status: AC | PRN
  Administered 2019-10-29: 100 mL via INTRAVENOUS

## 2019-10-29 NOTE — Telephone Encounter (Signed)
Received her CT angio, called pt to go over result:  DG Chest 2 View  Result Date: 10/27/2019 CLINICAL DATA:  59 year old female with shortness of breath. EXAM: CHEST - 2 VIEW COMPARISON:  Chest radiograph dated 06/30/2019. FINDINGS: The heart size and mediastinal contours are within normal limits. Both lungs are clear. The visualized skeletal structures are unremarkable. IMPRESSION: No active cardiopulmonary disease. Electronically Signed   By: Anner Crete M.D.   On: 10/27/2019 17:16   CT Angio Chest W/Cm &/Or Wo Cm  Result Date: 10/29/2019 CLINICAL DATA:  Shortness of breath with positive D-dimer EXAM: CT ANGIOGRAPHY CHEST WITH CONTRAST TECHNIQUE: Multidetector CT imaging of the chest was performed using the standard protocol during bolus administration of intravenous contrast. Multiplanar CT image reconstructions and MIPs were obtained to evaluate the vascular anatomy. CONTRAST:  125mL OMNIPAQUE IOHEXOL 350 MG/ML SOLN COMPARISON:  Chest radiograph October 27, 2019 FINDINGS: Cardiovascular: There is no demonstrable pulmonary embolus. There is no thoracic aortic aneurysm or dissection. Visualized great vessels appear unremarkable. Note that the right innominate and left common carotid arteries arise as a common trunk, an anatomic variant. The right subclavian artery arises distal to the left subclavian artery, passing posterior to the esophagus and trachea to reach the right side. There is mild impression on the posterior esophagus from the aberrant right subclavian artery. No pericardial effusion or pericardial thickening evident. Mediastinum/Nodes: There is an 8 mm nodular opacity in the right lobe of the thyroid. Note dominant thyroid mass which warrants additional imaging surveillance per consensus guidelines. There is no evident adenopathy. No esophageal lesions are evident. Lungs/Pleura: Lungs clear.  No pleural effusions evident. Upper Abdomen: Visualized upper abdominal structures appear  unremarkable. Musculoskeletal: There are no blastic or lytic bone lesions. No evident chest wall lesion. There is degenerative change in the lower thoracic region. Review of the MIP images confirms the above findings. IMPRESSION: 1. No demonstrable pulmonary embolus. No thoracic aortic aneurysm or dissection. 2. 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 3.  Lungs clear. 4.  No adenopathy. 5. Subcentimeter right thyroid nodule which per consensus guidelines does not warrant additional imaging surveillance. Electronically Signed   By: Lowella Grip III M.D.   On: 10/29/2019 11:31   No sign of PE CT scan findings are benign She is concerned about lack of energy and SOB- no CP however I will set her up for a stress test for cardiac eval  She will let me know if sx are changing or getting worse

## 2019-11-07 ENCOUNTER — Encounter: Payer: Self-pay | Admitting: Family Medicine

## 2019-11-21 ENCOUNTER — Other Ambulatory Visit: Payer: 59

## 2019-11-21 ENCOUNTER — Ambulatory Visit: Payer: 59 | Attending: Internal Medicine

## 2019-11-21 DIAGNOSIS — Z20822 Contact with and (suspected) exposure to covid-19: Secondary | ICD-10-CM

## 2019-11-23 LAB — NOVEL CORONAVIRUS, NAA: SARS-CoV-2, NAA: NOT DETECTED

## 2019-11-25 ENCOUNTER — Encounter: Payer: Self-pay | Admitting: Cardiology

## 2019-12-29 NOTE — Progress Notes (Addendum)
[place] at [facility] [address], [place], [place] [address] [phone]  Date:  12/31/2019   Name:  Joanne Kim   DOB:  [birth year]   MRN:  [id number]  PCP:  [name], MD    Chief Complaint: Fall (fell 3 years ago, back pain/lower back, pain with walking and laying down radiating in to legs )   History of Present Illness:  Joanne Kim is a 59 y.o. very pleasant female patient who presents with the following:  Patient who is generally healthy except for glaucoma, here today with concern of back pain Last seen by myself in January with shortness of breath We got a D-dimer at that time which was positive, we then obtained a follow-up CT chest which was negative I referred her for a cardiac stress test at that time due to lack of energy and shortness of breath She did not go for her stress test, notes that her SOB is still about the same, no change -she is not having chest pain, but notes shortness of breath especially with exercise.  She is willing to go ahead and schedule her stress test now  She has been tested for COVID-19 a few times and been negative  She notes that she fell about 3 years ago on a slippery floor. Her feet went out from under her and she landed on her right lower back and right hip She thought that she was ok, but had some discomfort as time went on She now notes pain with walking, sometimes she will feel a catch in the area that puts her off guard She puts a pillow in between her legs for comfort when she sleeps  The pain may radiate down both legs- both about the same.   She gets occasional tingling in her legs but not numbness No change in her bowel or bladder control  She notes that her sinuses are improved although not perfect at this time  She last used prednisone for this in January   Patient Active Problem List   Diagnosis Date Noted  . Aphakic open-angle glaucoma,  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  . Dry eyes 10/23/2016  . Obesity 10/23/2016  . RECTAL BLEEDING 12/14/2009    Past Medical History:  Diagnosis Date  . Elevated blood pressure reading   . History of colon polyps   . Vaginal Pap smear, abnormal 2017    Past Surgical History:  Procedure Laterality Date  . BREAST BIOPSY    . bunyonectomy      Social History   Tobacco Use  . Smoking status: Never Smoker  . Smokeless tobacco: Never Used  Substance Use Topics  . Alcohol use: Yes    Comment: occasionally  . Drug use: No    Family History  Problem Relation Age of Onset  . Heart disease Other   . Stroke Other   . Hypertension Other   . Breast cancer Maternal Aunt     Allergies  Allergen Reactions  . Oxycodone Itching    Medication list has been reviewed and updated.  Current Outpatient Medications on File Prior to Visit  Medication Sig Dispense Refill  . albuterol (VENTOLIN HFA) 108 (90 Base) MCG/ACT inhaler Inhale 2 puffs into the lungs every 6 (six) hours as needed for wheezing or shortness of breath. 18 g 2  . Azelastine HCl 137 MCG/SPRAY SOLN Place 2 sprays into both nostrils 2 (two) times daily as needed. [place]  mL 5  . levocetirizine (XYZAL) 5 MG tablet TAKE 1 TABLET BY MOUTH EVERY DAY IN THE EVENING 30 tablet 5  . Liraglutide -Weight Management (SAXENDA) 18 MG/3ML SOPN Inject 0.6 mg into the skin daily. Increase by 0.6 mg/day weekly to max dose of 3mg / daily 15 mL 3  . montelukast (SINGULAIR) 10 MG tablet TAKE 1 TABLET BY MOUTH AT BEDTIME USE AS NEEDED FOR ALLERGIES 30 tablet 1  . Olopatadine HCl 0.2 % SOLN Apply one drop to each eye daily 2.5 mL 6  . UNABLE TO FIND Med Name: [name]     No current facility-administered medications on file prior to visit.    Review of Systems:  As per HPI- otherwise negative.   Physical Examination: Vitals:   12/31/19 0911  BP: 121/60  Pulse: 68  Resp: 17  Temp: (!) 97.3 F (36.3 C)  SpO2: 98%   Vitals:   12/31/19 0911   Weight: 235 lb (106.6 kg)  Height: 5\' 9"  (1.753 m)   Body mass index is 34.7 kg/m. Ideal Body Weight: Weight in (lb) to have BMI = 25: 168.9  GEN: no acute distress.  Obese, otherwise looks well HEENT: Atraumatic, Normocephalic.   Neck is normal, no masses Ears and Nose: No external deformity. CV: RRR, No M/G/R. No JVD. No thrill. No extra heart sounds. PULM: CTA B, no wheezes, crackles, rhonchi. No retractions. No resp. distress. No accessory muscle use. EXTR: No c/c/e PSYCH: Normally interactive. Conversant.  Back exam: No bony tenderness.  She has tenderness over the right sciatic notch, and over the left lower back musculature.  Thoracolumbar flexion and extension is normal Normal bilateral lower extremity strength, sensation, DTR.  Negative straight leg raise    Assessment and Plan: SOB (shortness of breath)  Vitamin D deficiency - Plan: Vitamin D (25 hydroxy)  Low back pain radiating to right lower extremity - Plan: DG Lumbar Spine Complete, cyclobenzaprine (FLEXERIL) 10 MG tablet, predniSONE (DELTASONE) 20 MG tablet  Here today to discuss lower back pain.  She describes a fall about 3 years ago in which she injured her right sided lower back.  This has become somewhat worse recently.  Will obtain plain films, prescribe Flexeril to use as needed-caution regarding sedation.  We will also try a short course of prednisone Will be in touch with her pending her back films  Vitamin D level ordered  Unchanged shortness of breath, next step is stress test-we will work on arranging this for her Moderate medical decision making This visit occurred during the SARS-CoV-2 public health emergency.  Safety protocols were in place, including screening questions prior to the visit, additional usage of staff PPE, and extensive cleaning of exam room while observing appropriate contact time as indicated for disinfecting solutions.    Signed Lamar Blinks, MD  Received her vitamin D level  and x-ray report as below  DG Lumbar Spine Complete  Result Date: 12/31/2019 CLINICAL DATA:  Progressively worsening chronic low back pain since fall 3 years ago. EXAM: LUMBAR SPINE - COMPLETE 4+ VIEW COMPARISON:  None. FINDINGS: Five lumbar type vertebral bodies. No acute fracture or subluxation. Vertebral body heights are preserved. 8 mm anterolisthesis at L4-L5 due to advanced facet arthropathy. Mild L4-L5 and moderate L5-S1 disc height loss. The sacroiliac joints are unremarkable. IMPRESSION: 1. 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 2. Mild L4-L5 and moderate L5-S1 degenerative disc disease. Electronically Signed   By: Titus Dubin M.D.   On: 12/31/2019 15:06  Results for orders placed or performed in visit on 12/31/19  Vitamin D (25 hydroxy)  Result Value Ref Range   VITD 23.70 (L) 30.00 - 100.00 ng/mL   Message to patient

## 2019-12-30 ENCOUNTER — Other Ambulatory Visit: Payer: Self-pay

## 2019-12-31 ENCOUNTER — Encounter: Payer: Self-pay | Admitting: Family Medicine

## 2019-12-31 ENCOUNTER — Ambulatory Visit: Payer: 59 | Admitting: Family Medicine

## 2019-12-31 ENCOUNTER — Ambulatory Visit (HOSPITAL_BASED_OUTPATIENT_CLINIC_OR_DEPARTMENT_OTHER)
Admission: RE | Admit: 2019-12-31 | Discharge: 2019-12-31 | Disposition: A | Discharge status: Home / Self Care | Payer: 59 | Source: Ambulatory Visit | Attending: Family Medicine | Admitting: Family Medicine

## 2019-12-31 ENCOUNTER — Other Ambulatory Visit: Payer: Self-pay

## 2019-12-31 VITALS — BP 121/60 | HR 68 | Temp 97.3°F | Resp 17 | Ht 69.0 in | Wt 235.0 lb

## 2019-12-31 DIAGNOSIS — R0602 Shortness of breath: Secondary | ICD-10-CM | POA: Diagnosis not present

## 2019-12-31 DIAGNOSIS — M545 Low back pain, unspecified: Secondary | ICD-10-CM

## 2019-12-31 DIAGNOSIS — E559 Vitamin D deficiency, unspecified: Secondary | ICD-10-CM | POA: Diagnosis not present

## 2019-12-31 DIAGNOSIS — M79604 Pain in right leg: Secondary | ICD-10-CM

## 2019-12-31 LAB — VITAMIN D 25 HYDROXY (VIT D DEFICIENCY, FRACTURES): VITD: 23.7 ng/mL — ABNORMAL LOW (ref 30.00–100.00)

## 2019-12-31 MED ORDER — PREDNISONE 20 MG PO TABS: ORAL_TABLET | ORAL | 0 refills | Status: DC

## 2019-12-31 MED ORDER — CYCLOBENZAPRINE HCL 10 MG PO TABS: 10.0000 mg | ORAL_TABLET | Freq: Two times a day (BID) | ORAL | 0 refills | Status: DC | PRN

## 2019-12-31 NOTE — Patient Instructions (Signed)
It was good to see you today- I will be in touch with your x-rays and vitamin D level asap For back pain please use the prednisone rx, and you can use flexeril (muscle relaxer) as needed; this will make you drowsy however so be cautious!    I will get you set back up for your cardiac stress test Take care

## 2020-01-03 NOTE — Telephone Encounter (Signed)
-----   Message from Margot Ables sent at 01/02/2020  1:15 PM EDT ----- Dr. Lorelei Pont,  Unfortunately I need a new order on this one. Please let me know when entered and I'll get the auth and get her scheduled. Thank you.  Drue Dun ----- Message ----- From: [name], MD Sent: 12/31/2019  10:10 AM EDT To: Leonides Sake  I ordered a cardiac stress test for this patient back in January, she did not have it done then but would like to go ahead and schedule now.  Can you please help with this?  Thank you JC

## 2020-01-09 ENCOUNTER — Telehealth (HOSPITAL_COMMUNITY): Payer: Self-pay

## 2020-01-09 NOTE — Telephone Encounter (Signed)
Encounter complete. 

## 2020-01-12 ENCOUNTER — Other Ambulatory Visit (HOSPITAL_COMMUNITY)
Admission: RE | Admit: 2020-01-12 | Discharge: 2020-01-12 | Disposition: A | Discharge status: Home / Self Care | Payer: 59 | Source: Ambulatory Visit | Attending: Family Medicine | Admitting: Family Medicine

## 2020-01-12 DIAGNOSIS — Z20822 Contact with and (suspected) exposure to covid-19: Secondary | ICD-10-CM | POA: Insufficient documentation

## 2020-01-12 DIAGNOSIS — Z01812 Encounter for preprocedural laboratory examination: Secondary | ICD-10-CM | POA: Insufficient documentation

## 2020-01-12 LAB — SARS CORONAVIRUS 2 (TAT 6-24 HRS): SARS Coronavirus 2: NEGATIVE

## 2020-01-16 ENCOUNTER — Ambulatory Visit (HOSPITAL_COMMUNITY)
Admission: RE | Admit: 2020-01-16 | Discharge: 2020-01-16 | Disposition: A | Discharge status: Home / Self Care | Payer: 59 | Source: Ambulatory Visit | Attending: Cardiovascular Disease | Admitting: Cardiovascular Disease

## 2020-01-16 ENCOUNTER — Other Ambulatory Visit: Payer: Self-pay

## 2020-01-16 DIAGNOSIS — R0602 Shortness of breath: Secondary | ICD-10-CM | POA: Insufficient documentation

## 2020-01-16 LAB — EXERCISE TOLERANCE TEST
Estimated workload: 8.7 METS
Exercise duration (min): 7 min
Exercise duration (sec): 9 s
MPHR: 164 {beats}/min
Peak HR: 176 {beats}/min
Percent HR: 107 %
Rest HR: 87 {beats}/min

## 2020-01-17 ENCOUNTER — Encounter: Payer: Self-pay | Admitting: Family Medicine

## 2020-01-17 DIAGNOSIS — R0602 Shortness of breath: Secondary | ICD-10-CM

## 2020-01-27 ENCOUNTER — Telehealth: Payer: 59 | Admitting: Physician Assistant

## 2020-01-27 ENCOUNTER — Other Ambulatory Visit: Payer: Self-pay | Admitting: Family Medicine

## 2020-01-27 DIAGNOSIS — J349 Unspecified disorder of nose and nasal sinuses: Secondary | ICD-10-CM

## 2020-01-27 DIAGNOSIS — M545 Low back pain, unspecified: Secondary | ICD-10-CM

## 2020-01-27 DIAGNOSIS — M79604 Pain in right leg: Secondary | ICD-10-CM

## 2020-01-27 MED ORDER — MUCINEX 600 MG PO TB12: 600.0000 mg | ORAL_TABLET | Freq: Two times a day (BID) | ORAL | 0 refills | Status: DC | PRN

## 2020-01-27 MED ORDER — AFRIN NASAL SPRAY 0.05 % NA SOLN: 1.0000 | Freq: Two times a day (BID) | NASAL | 0 refills | Status: AC

## 2020-01-27 NOTE — Progress Notes (Signed)
We are sorry that you are not feeling well.  Here is how we plan to help!  Based on what you have shared with me it looks like you have sinusitis.  Sinusitis is inflammation and infection in the sinus cavities of the head.  Based on your presentation I believe you most likely have Acute Viral Sinusitis.This is an infection most likely caused by a virus. There is not specific treatment for viral sinusitis other than to help you with the symptoms until the infection runs its course.  You may use an oral decongestant such as Mucinex D or if you have glaucoma or high blood pressure use plain Mucinex. Saline nasal spray help and can safely be used as often as needed. For congestion and sinus pressure, I have prescribed: oxymetazoline nasal spray to be used twice daily for no longer than 3 days in a row. This is a very powerful decongestant and should not be used for longer than 3 days as continued use will call dependence and rebound nasal congestion when discontinued. I would recommend continuing to use your azelastine nasal spray and would recommend administering this first, waiting, 15-20 minutes, then administering the oxymetazoline spray.    Some authorities believe that zinc sprays or the use of Echinacea may shorten the course of your symptoms.  Sinus infections are not as easily transmitted as other respiratory infection, however we still recommend that you avoid close contact with loved ones, especially the very young and elderly.  Remember to wash your hands thoroughly throughout the day as this is the number one way to prevent the spread of infection!  Home Care:  Only take medications as instructed by your medical team.  Do not take these medications with alcohol.  A steam or ultrasonic humidifier can help congestion.  You can place a towel over your head and breathe in the steam from hot water coming from a faucet.  Avoid close contacts especially the very young and the elderly.  Cover  your mouth when you cough or sneeze.  Always remember to wash your hands.  Get Help Right Away If:  You develop worsening fever or sinus pain.  You develop a severe head ache or visual changes.  Your symptoms persist after you have completed your treatment plan.  Make sure you  Understand these instructions.  Will watch your condition.  Will get help right away if you are not doing well or get worse.  Your e-visit answers were reviewed by a board certified advanced clinical practitioner to complete your personal care plan.  Depending on the condition, your plan could have included both over the counter or prescription medications.  If there is a problem please reply  once you have received a response from your provider.  Your safety is important to [place].  If you have drug allergies check your prescription carefully.    You can use MyChart to ask questions about today's visit, request a non-urgent call back, or ask for a work or school excuse for 24 hours related to this e-Visit. If it has been greater than 24 hours you will need to follow up with your provider, or enter a new e-Visit to address those concerns.  You will get an e-mail in the next two days asking about your experience.  I hope that your e-visit has been valuable and will speed your recovery. Thank you for using e-visits.  Greater than 5 minutes, yet less than 10 minutes of time have been spent researching, coordinating,  and implementing care for this patient today.

## 2020-02-02 ENCOUNTER — Encounter: Payer: Self-pay | Admitting: Internal Medicine

## 2020-02-02 ENCOUNTER — Other Ambulatory Visit: Payer: Self-pay

## 2020-02-02 ENCOUNTER — Ambulatory Visit (INDEPENDENT_AMBULATORY_CARE_PROVIDER_SITE_OTHER): Payer: 59 | Admitting: Internal Medicine

## 2020-02-02 VITALS — BP 130/90 | HR 81 | Temp 98.0°F | Ht 69.0 in | Wt 237.8 lb

## 2020-02-02 DIAGNOSIS — R0602 Shortness of breath: Secondary | ICD-10-CM

## 2020-02-02 MED ORDER — AZELASTINE-FLUTICASONE 137-50 MCG/ACT NA SUSP: 1.0000 | Freq: Two times a day (BID) | NASAL | 11 refills | Status: DC

## 2020-02-02 NOTE — Progress Notes (Signed)
SUPREET TIM    [id number]    November 28, 1960  Primary Care Physician:Copland, Gay Filler, MD  Referring Physician: [name], [address] [place],  [place] [address] Reason for Consultation: shortness of breath Date of Consultation: 02/02/2020  Chief complaint:   Chief Complaint  Patient presents with  . Consult    sob x 4.  Dec/Jan sinus infection.  s/s mimicking covid, all tests -.  pcp feels she probably has had covid d/t s/s.  sob with walking and talking.  Allergies affecting sob.     HPI: Joanne Kim is a 59 y.o. woman who is referred here for shortness of breath.  She was treated with prednisone, amoxicillin and has had persistent symptoms.  These did not seem to help.  Her PCP thinks her symptoms are related to long-term covid. Her covid test was not positive but there was a strong suspicion for infection.   Breathing is worse when she is up walking around. She has no cough, no wheezing.  She has had a stress test and was told everything was normal.    Lost 60 lbs two years ago, but has gained it all back and gained over that.  She feels that her shortness of breath is related to weight gain and wearing a mask. She also feels very tired.  Overall she feels that her symptoms have gradually been improving on their own since December    Sinus congestion started last week. Itchy, watery eyes. montelukast, levocetirizine, mucinex, astelin nasal spray. Typically has spring and fall allergies, but no longer feels that allergies are seasonal but are now continuous year round.  Social history:  Occupation: Research scientist (life sciences) Exposures: no pets at home.  Smoking history: never smoker, no passive smoke exposure  Social History   Occupational History  . Not on file  Tobacco Use  . Smoking status: Never Smoker  . Smokeless tobacco: Never Used  Substance and Sexual Activity  . Alcohol use: Yes    Comment: occasionally  . Drug  use: No  . Sexual activity: Yes    Birth control/protection: Condom    Relevant family history:  Family History  Problem Relation Age of Onset  . Heart disease Other   . Stroke Other   . Hypertension Other   . Breast cancer Maternal Aunt   . Sarcoidosis Sister   . Asthma Son     Past Medical History:  Diagnosis Date  . Elevated blood pressure reading   . History of colon polyps   . Vaginal Pap smear, abnormal 2017    Past Surgical History:  Procedure Laterality Date  . BREAST BIOPSY    . bunyonectomy      Review of systems: Review of Systems  Constitutional: Negative for chills, fever and weight loss.  HENT: Positive for congestion. Negative for sinus pain and sore throat.   Eyes: Negative for discharge and redness.  Respiratory: Positive for shortness of breath. Negative for cough, hemoptysis, sputum production and wheezing.   Cardiovascular: Negative for chest pain, palpitations and leg swelling.  Gastrointestinal: Negative for heartburn, nausea and vomiting.  Musculoskeletal: Positive for joint pain. Negative for myalgias.  Skin: Negative for rash.  Neurological: Negative for dizziness, tremors, focal weakness and headaches.  Endo/Heme/Allergies: Positive for environmental allergies.  Psychiatric/Behavioral: Negative for depression. The patient is not nervous/anxious.   All other systems reviewed and are negative.   Physical Exam: Blood pressure 130/90,  pulse 81, temperature 98 F (36.7 C), temperature source Temporal, height 5\' 9"  (1.753 m), weight 237 lb 12.8 oz (107.9 kg), SpO2 100 %. Gen:      No acute distress ENT:  no nasal polyps, mucus membranes moist, cobblestoning in oropharynx, mild nasal erythema Lungs:    No increased respiratory effort, symmetric chest wall excursion, clear to auscultation bilaterally, no wheezes or crackles CV:         Regular rate and rhythm; no murmurs, rubs, or gallops.  No pedal edema Abd:   Obese, + bowel sounds; soft,  non-tender; no distension MSK: no acute synovitis of DIP or PIP joints, no mechanics hands.  Skin:      Warm and dry; no rashes Neuro: normal speech, no focal facial asymmetry Psych: alert and oriented x3, normal mood and affect   Data Reviewed/Medical Decision Making:  Independent interpretation of tests: Imaging: . Review of patient's CT angio October 29, 2019 images revealed no pulmonary embolism, no acute cardiopulmonary process. The patient's images have been independently reviewed by me.    PFTs: None on file.   Labs:  Lab Results  Component Value Date   WBC 7.5 10/27/2019   HGB 14.4 10/27/2019   HCT 44.4 10/27/2019   MCV 89.1 10/27/2019   PLT 181.0 10/27/2019    Immunization status:  Immunization History  Administered Date(s) Administered  . Tdap 10/23/2016    . I reviewed prior external note(s) from Dr. Edilia Bo. . I reviewed the result(s) of the labs and imaging as noted above.    Assessment:  Shortness of breath Allergic Rhinitis, not well controlled  Plan/Recommendations: Continue montelukast, levocetirizine.  We will switch her intranasal Astelin to combination Astelin and fluticasone.  She should take this twice a day.  If this is not covered we can try them separately.  She feels overall that her shortness of breath is improving, so I think it is reasonable to hold off on further inhalers or testing at this time.  We can revisit this if her symptoms do not continue to improve at follow-up  We discussed disease management and progression at length today.   I spent 46 minutes in the care of this patient today including pre-charting, chart review, review of results, face-to-face care, coordination of care and communication with consultants etc.).   Return to Care: Return in about 4 months (around 06/03/2020).  I will see her back on my return.  Lenice Llamas, MD [facility] [place]  CC: Copland,  Gay Filler, MD

## 2020-02-02 NOTE — Patient Instructions (Addendum)
The patient should have follow up scheduled in 4 months with myself.   Dymista(combination astelin and flonase)- 1 spray on each side of your nose twice a day   Instructions for use:  If you also use a saline nasal spray or rinse, use that first.  Position the head with the chin slightly tucked. Use the right hand to spray into the left nostril and the right hand to spray into the left nostril.   Point the bottle away from the septum of your nose (cartilage that divides the two sides of your nose).   Hold the nostril closed on the opposite side from where you will spray  Spray once and gently sniff to pull the medicine into the higher parts of your nose.  Don't sniff too hard as the medicine will drain down the back of your throat instead.  Repeat with a second spray on the same side if prescribed.  Repeat on the other side of your nose.

## 2020-02-16 ENCOUNTER — Telehealth: Payer: Self-pay | Admitting: Internal Medicine

## 2020-02-16 MED ORDER — FLUTICASONE PROPIONATE 50 MCG/ACT NA SUSP: 2.0000 | Freq: Every day | NASAL | 2 refills | Status: DC

## 2020-02-16 NOTE — Telephone Encounter (Signed)
Called and spoke with patient about prescription. Patient states that the Azelastine/Fluticasone nasal spray is not covered. She already has Azelastine nasal spray and would just like for the Fluticasone sent to her pharmacy. Verified pharmacy. RX sent in. Nothing further needed at this time.

## 2020-02-25 ENCOUNTER — Other Ambulatory Visit: Payer: Self-pay | Admitting: Family Medicine

## 2020-03-05 ENCOUNTER — Encounter: Payer: Self-pay | Admitting: Family Medicine

## 2020-03-05 ENCOUNTER — Telehealth: Payer: 59 | Admitting: Nurse Practitioner

## 2020-03-05 DIAGNOSIS — B9689 Other specified bacterial agents as the cause of diseases classified elsewhere: Secondary | ICD-10-CM | POA: Diagnosis not present

## 2020-03-05 DIAGNOSIS — J329 Chronic sinusitis, unspecified: Secondary | ICD-10-CM

## 2020-03-05 MED ORDER — AMOXICILLIN-POT CLAVULANATE 875-125 MG PO TABS: 1.0000 | ORAL_TABLET | Freq: Two times a day (BID) | ORAL | 0 refills | Status: DC

## 2020-03-05 MED ORDER — FLUCONAZOLE 150 MG PO TABS: 150.0000 mg | ORAL_TABLET | Freq: Once | ORAL | 0 refills | Status: AC

## 2020-03-05 NOTE — Progress Notes (Signed)
We are sorry that you are not feeling well.  Here is how we plan to help!  Based on what you have shared with me it looks like you have sinusitis.  Sinusitis is inflammation and infection in the sinus cavities of the head.  Based on your presentation I believe you most likely have Acute Bacterial Sinusitis.  This is an infection caused by bacteria and is treated with antibiotics. I have prescribed Augmentin 875mg/125mg one tablet twice daily with food, for 7 days. You may use an oral decongestant such as Mucinex D or if you have glaucoma or high blood pressure use plain Mucinex. Saline nasal spray help and can safely be used as often as needed for congestion.  If you develop worsening sinus pain, fever or notice severe headache and vision changes, or if symptoms are not better after completion of antibiotic, please schedule an appointment with a health care provider.    Sinus infections are not as easily transmitted as other respiratory infection, however we still recommend that you avoid close contact with loved ones, especially the very young and elderly.  Remember to wash your hands thoroughly throughout the day as this is the number one way to prevent the spread of infection!  Home Care:  Only take medications as instructed by your medical team.  Complete the entire course of an antibiotic.  Do not take these medications with alcohol.  A steam or ultrasonic humidifier can help congestion.  You can place a towel over your head and breathe in the steam from hot water coming from a faucet.  Avoid close contacts especially the very young and the elderly.  Cover your mouth when you cough or sneeze.  Always remember to wash your hands.  Get Help Right Away If:  You develop worsening fever or sinus pain.  You develop a severe head ache or visual changes.  Your symptoms persist after you have completed your treatment plan.  Make sure you  Understand these instructions.  Will watch your  condition.  Will get help right away if you are not doing well or get worse.  Your e-visit answers were reviewed by a board certified advanced clinical practitioner to complete your personal care plan.  Depending on the condition, your plan could have included both over the counter or prescription medications.  If there is a problem please reply  once you have received a response from your provider.  Your safety is important to us.  If you have drug allergies check your prescription carefully.    You can use MyChart to ask questions about today's visit, request a non-urgent call back, or ask for a work or school excuse for 24 hours related to this e-Visit. If it has been greater than 24 hours you will need to follow up with your provider, or enter a new e-Visit to address those concerns.  You will get an e-mail in the next two days asking about your experience.  I hope that your e-visit has been valuable and will speed your recovery. Thank you for using e-visits.  I have spent at least 5 minutes reviewing and documenting in the patient's chart.   

## 2020-03-06 NOTE — Progress Notes (Deleted)
[place] at [facility] [address], [place], [place] [address] [phone]  Date:  03/10/2020   Name:  Joanne Kim   DOB:  [birth year]   MRN:  [id number]  PCP:  [name], MD    Chief Complaint: No chief complaint on file.   History of Present Illness:  Joanne Kim is a 59 y.o. very pleasant female patient who presents with the following:  Generally healthy woman with history of glaucoma.  Here today with concern of fatigue and persistent lower back pain I last saw her in March 24-at that time she noted a fall about 3 years ago on a slippery floor.  She had noted some back pain intermittently since that time.  We got plain films and tried Flexeril, short prednisone burst  X-rays as follows Result Date: 12/31/2019 CLINICAL DATA:  Progressively worsening chronic low back pain since fall 3 years ago. EXAM: LUMBAR SPINE - COMPLETE 4+ VIEW COMPARISON:  None. FINDINGS: Five lumbar type vertebral bodies. No acute fracture or subluxation. Vertebral body heights are preserved. 8 mm anterolisthesis at L4-L5 due to advanced facet arthropathy. Mild L4-L5 and moderate L5-S1 disc height loss. The sacroiliac joints are unremarkable. IMPRESSION: 1. 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 2. Mild L4-L5 and moderate L5-S1 degenerative disc disease. Electronically Signed   By: Titus Dubin M.D.   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   Dry eyes 10/23/2016   Obesity 10/23/2016   RECTAL BLEEDING 12/14/2009    Past Medical History:  Diagnosis Date   Elevated blood pressure reading    History of colon polyps    Vaginal Pap smear, abnormal 2017    Past Surgical History:  Procedure Laterality Date   BREAST BIOPSY     bunyonectomy       Social History   Tobacco Use   Smoking status: Never Smoker   Smokeless tobacco: Never Used  Substance Use Topics   Alcohol use: Yes    Comment: occasionally   Drug use: No    Family History  Problem Relation Age of Onset   Heart disease Other    Stroke Other    Hypertension Other    Breast cancer Maternal Aunt    Sarcoidosis Sister    Asthma Son     Allergies  Allergen Reactions   Oxycodone Itching    Medication list has been reviewed and updated.  Current Outpatient Medications on File Prior to Visit  Medication Sig Dispense Refill   albuterol (VENTOLIN HFA) 108 (90 Base) MCG/ACT inhaler Inhale 2 puffs into the lungs every 6 (six) hours as needed for wheezing or shortness of breath. (Patient not taking: Reported on 02/02/2020) 18 g 2   amoxicillin-clavulanate (AUGMENTIN) 875-125 MG tablet Take 1 tablet by mouth 2 (two) times daily. 20 tablet 0   Azelastine HCl 137 MCG/SPRAY SOLN Place 2 sprays into both nostrils 2 (two) times daily as needed. 30 mL 5   fluticasone (FLONASE) 50 MCG/ACT nasal spray Place 2 sprays into both nostrils daily. 16 g 2   guaiFENesin (MUCINEX) 600 MG 12 hr tablet Take 1 tablet (600 mg total) by mouth 2 (two) times daily as needed for cough. 30 tablet 0   levocetirizine (XYZAL) 5 MG tablet TAKE 1 TABLET BY MOUTH EVERY DAY  IN THE EVENING 30 tablet 2   Liraglutide -Weight Management (SAXENDA) 18 MG/3ML SOPN Inject 0.6 mg into the skin daily. Increase by 0.6 mg/day weekly to max dose of 3mg / daily 15 mL 3   montelukast (SINGULAIR) 10 MG tablet TAKE 1 TABLET BY MOUTH AT BEDTIME USE AS NEEDED FOR ALLERGIES 30 tablet 1   Olopatadine HCl 0.2 % SOLN Apply one drop to each eye daily (Patient not taking: Reported on 02/02/2020) 2.5 mL 6   UNABLE TO FIND Med Name: [name]     No current facility-administered medications on file prior to visit.    Review of Systems:  As per HPI- otherwise negative.   Physical Examination: There  were no vitals filed for this visit. There were no vitals filed for this visit. There is no height or weight on file to calculate BMI. Ideal Body Weight:    GEN: no acute distress. HEENT: Atraumatic, Normocephalic.  Ears and Nose: No external deformity. CV: RRR, No M/G/R. No JVD. No thrill. No extra heart sounds. PULM: CTA B, no wheezes, crackles, rhonchi. No retractions. No resp. distress. No accessory muscle use. ABD: S, NT, ND, +BS. No rebound. No HSM. EXTR: No c/c/e PSYCH: Normally interactive. Conversant.    Assessment and Plan: *** This visit occurred during the SARS-CoV-2 public health emergency.  Safety protocols were in place, including screening questions prior to the visit, additional usage of staff PPE, and extensive cleaning of exam room while observing appropriate contact time as indicated for disinfecting solutions.   Signed Lamar Blinks, MD

## 2020-03-10 ENCOUNTER — Ambulatory Visit: Payer: 59 | Admitting: Family Medicine

## 2020-05-26 ENCOUNTER — Other Ambulatory Visit (HOSPITAL_BASED_OUTPATIENT_CLINIC_OR_DEPARTMENT_OTHER): Payer: Self-pay | Admitting: Family Medicine

## 2020-05-26 DIAGNOSIS — Z1231 Encounter for screening mammogram for malignant neoplasm of breast: Secondary | ICD-10-CM

## 2020-05-27 ENCOUNTER — Telehealth: Payer: Self-pay | Admitting: Family Medicine

## 2020-05-27 ENCOUNTER — Other Ambulatory Visit: Payer: Self-pay | Admitting: Internal Medicine

## 2020-05-27 MED ORDER — FLUCONAZOLE 150 MG PO TABS: 150.0000 mg | ORAL_TABLET | Freq: Once | ORAL | 0 refills | Status: AC

## 2020-05-27 NOTE — Telephone Encounter (Signed)
CallerKennyth Lose Call Back # [phone]  Patient when to the ED for a spider bite was given some antibiotic now she is experiencing some yeast infection. She is wondering if you could prescribe something for it.  Pharmacy:  [id number] - [place], [place] Phone:  [phone]  Fax:  [phone]

## 2020-06-01 ENCOUNTER — Other Ambulatory Visit: Payer: Self-pay | Admitting: *Deleted

## 2020-06-01 MED ORDER — FLUTICASONE PROPIONATE 50 MCG/ACT NA SUSP: 2.0000 | Freq: Every day | NASAL | 2 refills | Status: DC

## 2020-06-01 NOTE — Telephone Encounter (Signed)
Called and let patient know that her flonase had been sent to the pharmacy.  Nothing further needed.

## 2020-06-03 ENCOUNTER — Ambulatory Visit (HOSPITAL_BASED_OUTPATIENT_CLINIC_OR_DEPARTMENT_OTHER): Payer: 59

## 2020-09-22 ENCOUNTER — Telehealth: Payer: Self-pay | Admitting: Family Medicine

## 2020-09-22 ENCOUNTER — Other Ambulatory Visit: Payer: Self-pay

## 2020-09-22 ENCOUNTER — Encounter: Payer: Self-pay | Admitting: Family Medicine

## 2020-09-22 ENCOUNTER — Telehealth (INDEPENDENT_AMBULATORY_CARE_PROVIDER_SITE_OTHER): Payer: 59 | Admitting: Family Medicine

## 2020-09-22 ENCOUNTER — Other Ambulatory Visit: Payer: Self-pay | Admitting: Family Medicine

## 2020-09-22 DIAGNOSIS — J0110 Acute frontal sinusitis, unspecified: Secondary | ICD-10-CM | POA: Diagnosis not present

## 2020-09-22 DIAGNOSIS — F4323 Adjustment disorder with mixed anxiety and depressed mood: Secondary | ICD-10-CM

## 2020-09-22 MED ORDER — AMOXICILLIN 500 MG PO CAPS: 1000.0000 mg | ORAL_CAPSULE | Freq: Two times a day (BID) | ORAL | 0 refills | Status: DC

## 2020-09-22 MED ORDER — FLUCONAZOLE 150 MG PO TABS: 150.0000 mg | ORAL_TABLET | Freq: Once | ORAL | 0 refills | Status: DC

## 2020-09-22 MED ORDER — PREDNISONE 20 MG PO TABS: ORAL_TABLET | ORAL | 0 refills | Status: DC

## 2020-09-22 MED FILL — AMOXICILLIN 500 MG CAPSULE: 500 | 10 days supply | Qty: 40 | Fill #0

## 2020-09-22 MED FILL — FLUCONAZOLE 150 MG TABS: 150 | 7 days supply | Qty: 2 | Fill #0

## 2020-09-22 MED FILL — predniSONE 20 MG TABS: 20 | 6 days supply | Qty: 9 | Fill #0

## 2020-09-22 NOTE — Telephone Encounter (Signed)
Pt stated was seen virtual today (12-15--2021)with provider and that pt is needing a letter for work to return back to work on Monday Sep 27, 2020. Pt would like to have the work note to be sent on mychart. Please advise. If any question please contact pt at [phone].
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
## 2020-09-23 NOTE — Telephone Encounter (Signed)
Letter has been released to patient via mychart.

## 2020-11-19 NOTE — Progress Notes (Addendum)
[facility] [place], [place], [place] [address] [phone]  Date:  11/22/2020   Name:  Joanne Kim   DOB:  [birth year]   MRN:  [id number]  PCP:  [name], MD    Chief Complaint: Leg Pain and Sinusitis (Ongoing sinus congestion, pressure, fatigue, tenderness of teeth and around eyes/)   History of Present Illness:  Joanne Kim is a 60 y.o. very pleasant female patient who presents with the following:  Pt here today with concern of leg pain- Generally healthy woman with history of glaucoma Last seen by myself on video in December   covid series- she is doing this now  Colon-  She is due for colonoscopy, will refer  Flu vaccine- offered to her  mammo- she will schedule this   Today she notes pain in her left lower back which is going down her leg She has noted this intermittently for about 2 months Not getting worse No heat or swelling in her leg  No numbness or weakness of the leg, "it just hurts" NKI- may have started when she moved wrong rolling over in bed. She got up and did some stretches but it did not go away  Never had in the past  No bowel or bladder incontinence  Also, she notes sx of a sinus infection for 2 weeks- sinus pressure and pain, pain in her teeth No cough or fever   Patient Active Problem List   Diagnosis Date Noted  . 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 Dry eyes 10/23/2016  . Obesity 10/23/2016  . RECTAL BLEEDING 12/14/2009    Past Medical History:  Diagnosis Date  . Elevated blood pressure reading   . History of colon polyps   . Vaginal Pap smear, abnormal 2017    Past Surgical History:  Procedure Laterality Date  . BREAST BIOPSY    . bunyonectomy      Social History   Tobacco Use  . Smoking status: Never Smoker  . Smokeless tobacco: Never Used  Substance Use Topics  . Alcohol use: Yes    Comment: occasionally  . Drug  use: No    Family History  Problem Relation Age of Onset  . Heart disease Other   . Stroke Other   . Hypertension Other   . Breast cancer Maternal Aunt   . Sarcoidosis Sister   . Asthma Son     Allergies  Allergen Reactions  . Oxycodone Itching    Medication list has been reviewed and updated.  Current Outpatient Medications on File Prior to Visit  Medication Sig Dispense Refill  . amoxicillin (AMOXIL) 500 MG capsule Take 2 capsules (1,000 mg total) by mouth 2 (two) times daily. 40 capsule 0  . Azelastine HCl 137 MCG/SPRAY SOLN Place 2 sprays into both nostrils 2 (two) times daily as needed. 30 mL 5  . fluticasone (FLONASE) 50 MCG/ACT nasal spray Place 2 sprays into both nostrils daily. 16 g 2  . levocetirizine (XYZAL) 5 MG tablet TAKE 1 TABLET BY MOUTH EVERY DAY IN THE EVENING 30 tablet 2  . montelukast (SINGULAIR) 10 MG tablet TAKE 1 TABLET BY MOUTH AT BEDTIME USE AS NEEDED FOR ALLERGIES 30 tablet 1  . predniSONE (DELTASONE) 20 MG tablet Take 40 mg daily for 3 days then 20 mg daily for 3 days 9 tablet 0   No current facility-administered medications on file prior to visit.  Review of Systems:  As per HPI- otherwise negative.   Physical Examination: Vitals:   11/22/20 0828  BP: 126/88  Pulse: 73  Resp: 17  SpO2: 99%   Vitals:   11/22/20 0828  Weight: 237 lb (107.5 kg)  Height: 5\' 9"  (1.753 m)   Body mass index is 35 kg/m. Ideal Body Weight: Weight in (lb) to have BMI = 25: 168.9  GEN: no acute distress.  Obese, looks well  HEENT: Atraumatic, Normocephalic.  Bilateral TM wnl, oropharynx normal.  PEERL,EOMI.  "allergic shiners" are present bilaterally, sinuses are tender to percussion Ears and Nose: No external deformity. CV: RRR, No M/G/R. No JVD. No thrill. No extra heart sounds. PULM: CTA B, no wheezes, crackles, rhonchi. No retractions. No resp. distress. No accessory muscle use. EXTR: No c/c/e PSYCH: Normally interactive. Conversant.  Tenderness of  left lower paraspinous muscles Right ok Normal BLE strength, sensation and DTR Negative SLR   Assessment and Plan: Acute left-sided low back pain with left-sided sciatica - Plan: predniSONE (DELTASONE) 20 MG tablet, cyclobenzaprine (FLEXERIL) 10 MG tablet  Screening mammogram for breast cancer - Plan: MM 3D SCREEN BREAST BILATERAL  Screening for colon cancer - Plan: Ambulatory referral to Gastroenterology  Acute non-recurrent frontal sinusitis - Plan: predniSONE (DELTASONE) 20 MG tablet, amoxicillin (AMOXIL) 500 MG capsule, fluconazole (DIFLUCAN) 150 MG tablet  Screening for diabetes mellitus - Plan: Comprehensive metabolic panel, Hemoglobin A1c  Fatigue, unspecified type - Plan: TSH, VITAMIN D 25 Hydroxy (Vit-D Deficiency, Fractures)  Screening, lipid - Plan: Lipid panel  Screening, deficiency anemia, iron - Plan: CBC  Pt here today with concern of sinusitis and also left lower back pain with sciatica Treat as above Pt asked to alert me if not better in the next 2-3 days- Sooner if worse.  Also catch up on health maint Will plan further follow- up pending labs.  This visit occurred during the SARS-CoV-2 public health emergency.  Safety protocols were in place, including screening questions prior to the visit, additional usage of staff PPE, and extensive cleaning of exam room while observing appropriate contact time as indicated for disinfecting solutions.    Signed Lamar Blinks, MD  Received her labs as below, message to patient  Results for orders placed or performed in visit on 11/22/20  CBC  Result Value Ref Range   WBC 5.0 4.0 - 10.5 K/uL   RBC 4.86 3.87 - 5.11 Mil/uL   Platelets 143.0 (L) 150.0 - 400.0 K/uL   Hemoglobin 13.4 12.0 - 15.0 g/dL   HCT 41.1 36.0 - 46.0 %   MCV 84.5 78.0 - 100.0 fl   MCHC 32.7 30.0 - 36.0 g/dL   RDW 17.7 (H) 11.5 - 15.5 %  Comprehensive metabolic panel  Result Value Ref Range   Sodium 140 135 - 145 mEq/L   Potassium 4.1 3.5 - 5.1  mEq/L   Chloride 106 96 - 112 mEq/L   CO2 29 19 - 32 mEq/L   Glucose, Bld 84 70 - 99 mg/dL   BUN 10 6 - 23 mg/dL   Creatinine, Ser 0.79 0.40 - 1.20 mg/dL   Total Bilirubin 0.7 0.2 - 1.2 mg/dL   Alkaline Phosphatase 81 39 - 117 U/L   AST 20 0 - 37 U/L   ALT 13 0 - 35 U/L   Total Protein 6.8 6.0 - 8.3 g/dL   Albumin 3.8 3.5 - 5.2 g/dL   GFR 82.93 >60.00 mL/min   Calcium 9.4 8.4 - 10.5 mg/dL  Hemoglobin  A1c  Result Value Ref Range   Hgb A1c MFr Bld 5.6 4.6 - 6.5 %  Lipid panel  Result Value Ref Range   Cholesterol 179 0 - 200 mg/dL   Triglycerides 142.0 0.0 - 149.0 mg/dL   HDL 60.10 >39.00 mg/dL   VLDL 28.4 0.0 - 40.0 mg/dL   LDL Cholesterol 90 0 - 99 mg/dL   Total CHOL/HDL Ratio 3    NonHDL 118.63   TSH  Result Value Ref Range   TSH 3.82 0.35 - 4.50 uIU/mL  VITAMIN D 25 Hydroxy (Vit-D Deficiency, Fractures)  Result Value Ref Range   VITD 26.71 (L) 30.00 - 100.00 ng/mL

## 2020-11-19 NOTE — Patient Instructions (Addendum)
It was good to see you again today! I will be in touch with your labs Ordered mammogram Ordered GI consult for colonoscopy Please consider getting the flu shot and shingles series  For your back- prednisone for 6 days Flexeril as needed- muscle relaxer.  Can make you sleepy!  For sinus infection- amoxicillin for 10 days  Please let me know if you are not feeling better in the next few days-Sooner if worse.

## 2020-11-22 ENCOUNTER — Encounter: Payer: Self-pay | Admitting: Family Medicine

## 2020-11-22 ENCOUNTER — Other Ambulatory Visit: Payer: Self-pay | Admitting: Family Medicine

## 2020-11-22 ENCOUNTER — Other Ambulatory Visit: Payer: Self-pay

## 2020-11-22 ENCOUNTER — Ambulatory Visit: Payer: 59 | Admitting: Family Medicine

## 2020-11-22 VITALS — BP 126/88 | HR 73 | Resp 17 | Ht 69.0 in | Wt 237.0 lb

## 2020-11-22 DIAGNOSIS — Z1231 Encounter for screening mammogram for malignant neoplasm of breast: Secondary | ICD-10-CM

## 2020-11-22 DIAGNOSIS — M5442 Lumbago with sciatica, left side: Secondary | ICD-10-CM | POA: Diagnosis not present

## 2020-11-22 DIAGNOSIS — Z13220 Encounter for screening for lipoid disorders: Secondary | ICD-10-CM | POA: Diagnosis not present

## 2020-11-22 DIAGNOSIS — Z1211 Encounter for screening for malignant neoplasm of colon: Secondary | ICD-10-CM | POA: Diagnosis not present

## 2020-11-22 DIAGNOSIS — J0110 Acute frontal sinusitis, unspecified: Secondary | ICD-10-CM

## 2020-11-22 DIAGNOSIS — Z130 Encounter for screening for diseases of the blood and blood-forming organs and certain disorders involving the immune mechanism: Secondary | ICD-10-CM | POA: Diagnosis not present

## 2020-11-22 DIAGNOSIS — R5383 Other fatigue: Secondary | ICD-10-CM | POA: Diagnosis not present

## 2020-11-22 DIAGNOSIS — Z131 Encounter for screening for diabetes mellitus: Secondary | ICD-10-CM

## 2020-11-22 LAB — COMPREHENSIVE METABOLIC PANEL
ALT: 13 U/L (ref 0–35)
AST: 20 U/L (ref 0–37)
Albumin: 3.8 g/dL (ref 3.5–5.2)
Alkaline Phosphatase: 81 U/L (ref 39–117)
BUN: 10 mg/dL (ref 6–23)
CO2: 29 mEq/L (ref 19–32)
Calcium: 9.4 mg/dL (ref 8.4–10.5)
Chloride: 106 mEq/L (ref 96–112)
Creatinine, Ser: 0.79 mg/dL (ref 0.40–1.20)
GFR: 82.93 mL/min (ref >60.00)
Glucose, Bld: 84 mg/dL (ref 70–99)
Potassium: 4.1 mEq/L (ref 3.5–5.1)
Sodium: 140 mEq/L (ref 135–145)
Total Bilirubin: 0.7 mg/dL (ref 0.2–1.2)
Total Protein: 6.8 g/dL (ref 6.0–8.3)

## 2020-11-22 LAB — LIPID PANEL
Cholesterol: 179 mg/dL (ref 0–200)
HDL: 60.1 mg/dL (ref >39.00)
LDL Cholesterol: 90 mg/dL (ref 0–99)
NonHDL: 118.63
Total CHOL/HDL Ratio: 3
Triglycerides: 142 mg/dL (ref 0.0–149.0)
VLDL: 28.4 mg/dL (ref 0.0–40.0)

## 2020-11-22 LAB — VITAMIN D 25 HYDROXY (VIT D DEFICIENCY, FRACTURES): VITD: 26.71 ng/mL — ABNORMAL LOW (ref 30.00–100.00)

## 2020-11-22 LAB — CBC
HCT: 41.1 % (ref 36.0–46.0)
Hemoglobin: 13.4 g/dL (ref 12.0–15.0)
MCHC: 32.7 g/dL (ref 30.0–36.0)
MCV: 84.5 fl (ref 78.0–100.0)
Platelets: 143 10*3/uL — ABNORMAL LOW (ref 150.0–400.0)
RBC: 4.86 Mil/uL (ref 3.87–5.11)
RDW: 17.7 % — ABNORMAL HIGH (ref 11.5–15.5)
WBC: 5 10*3/uL (ref 4.0–10.5)

## 2020-11-22 LAB — TSH: TSH: 3.82 u[IU]/mL (ref 0.35–4.50)

## 2020-11-22 LAB — HEMOGLOBIN A1C: Hgb A1c MFr Bld: 5.6 % (ref 4.6–6.5)

## 2020-11-22 MED ORDER — CYCLOBENZAPRINE HCL 10 MG PO TABS: 5.0000 mg | ORAL_TABLET | Freq: Two times a day (BID) | ORAL | 0 refills | Status: DC | PRN

## 2020-11-22 MED ORDER — PREDNISONE 20 MG PO TABS: ORAL_TABLET | ORAL | 0 refills | Status: DC

## 2020-11-22 MED ORDER — AMOXICILLIN 500 MG PO CAPS: 1000.0000 mg | ORAL_CAPSULE | Freq: Two times a day (BID) | ORAL | 0 refills | Status: DC

## 2020-11-22 MED ORDER — FLUCONAZOLE 150 MG PO TABS: 150.0000 mg | ORAL_TABLET | Freq: Once | ORAL | 0 refills | Status: DC

## 2020-11-22 MED FILL — predniSONE 20 MG TABS: 20 | 6 days supply | Qty: 9 | Fill #0

## 2020-11-22 MED FILL — CYCLOBENZAPRINE HCL 10 MG T: 10 | 10 days supply | Qty: 20 | Fill #0

## 2020-11-22 MED FILL — AMOXICILLIN 500 MG CAPSULE: 500 | 10 days supply | Qty: 40 | Fill #0

## 2020-11-22 MED FILL — FLUCONAZOLE 150 MG TABS: 150 | 7 days supply | Qty: 2 | Fill #0

## 2020-12-02 ENCOUNTER — Encounter: Payer: Self-pay | Admitting: Gastroenterology

## 2020-12-11 ENCOUNTER — Ambulatory Visit (HOSPITAL_BASED_OUTPATIENT_CLINIC_OR_DEPARTMENT_OTHER)
Admission: RE | Admit: 2020-12-11 | Discharge: 2020-12-11 | Disposition: A | Discharge status: Home / Self Care | Payer: 59 | Source: Ambulatory Visit | Attending: Family Medicine | Admitting: Family Medicine

## 2020-12-11 ENCOUNTER — Other Ambulatory Visit: Payer: Self-pay

## 2020-12-11 DIAGNOSIS — Z1231 Encounter for screening mammogram for malignant neoplasm of breast: Secondary | ICD-10-CM | POA: Insufficient documentation

## 2020-12-14 ENCOUNTER — Encounter: Payer: Self-pay | Admitting: Family Medicine
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
## 2020-12-15 ENCOUNTER — Other Ambulatory Visit: Payer: Self-pay

## 2020-12-15 ENCOUNTER — Encounter: Payer: Self-pay | Admitting: Family Medicine

## 2020-12-15 ENCOUNTER — Other Ambulatory Visit: Payer: Self-pay | Admitting: Family Medicine

## 2020-12-15 ENCOUNTER — Ambulatory Visit (INDEPENDENT_AMBULATORY_CARE_PROVIDER_SITE_OTHER): Payer: 59 | Admitting: Family Medicine

## 2020-12-15 DIAGNOSIS — J301 Allergic rhinitis due to pollen: Secondary | ICD-10-CM | POA: Diagnosis not present

## 2020-12-15 DIAGNOSIS — J0110 Acute frontal sinusitis, unspecified: Secondary | ICD-10-CM

## 2020-12-15 MED ORDER — MONTELUKAST SODIUM 10 MG PO TABS: ORAL_TABLET | ORAL | 3 refills | Status: DC

## 2020-12-15 MED ORDER — LEVOCETIRIZINE DIHYDROCHLORIDE 5 MG PO TABS: ORAL_TABLET | ORAL | 3 refills | Status: DC

## 2020-12-15 MED ORDER — FLUTICASONE PROPIONATE 50 MCG/ACT NA SUSP: 2.0000 | Freq: Every day | NASAL | 9 refills | Status: DC

## 2020-12-15 MED ORDER — AZELASTINE HCL 137 MCG/SPRAY NA SOLN
2.0000 | Freq: Two times a day (BID) | NASAL | 9 refills | Status: DC | PRN
Start: 2020-12-15 — End: 2020-12-15

## 2020-12-15 MED ORDER — PREDNISONE 20 MG PO TABS: ORAL_TABLET | ORAL | 0 refills | Status: DC

## 2020-12-15 MED FILL — MONTELUKAST SOD 10 MG TAB: 10 | 90 days supply | Qty: 90 | Fill #0

## 2020-12-15 MED FILL — predniSONE 20 MG TABS: 20 | 6 days supply | Qty: 9 | Fill #0

## 2020-12-15 MED FILL — FLUTICASONE PROP 50 MCG SPR: 50 | 30 days supply | Qty: 16 | Fill #0

## 2020-12-15 MED FILL — LEVOCETIRIZINE 5 MG TABLET: 5 | 90 days supply | Qty: 90 | Fill #0

## 2020-12-15 MED FILL — AZELASTINE HCL 137 MCG/SPRA: 137 | 25 days supply | Qty: 30 | Fill #0

## 2020-12-15 NOTE — Patient Instructions (Signed)
Good to see you again today- I hope you are back in your home soon!   Letter for your insurance company written  We will refer you to an allergist for evaluation of your persistent symptoms  I also gave you a 6 day course of prednisone to take for current symptoms

## 2021-01-01 ENCOUNTER — Ambulatory Visit (HOSPITAL_BASED_OUTPATIENT_CLINIC_OR_DEPARTMENT_OTHER): Payer: 59

## 2021-01-01 ENCOUNTER — Encounter (HOSPITAL_BASED_OUTPATIENT_CLINIC_OR_DEPARTMENT_OTHER): Payer: Self-pay

## 2021-01-05 ENCOUNTER — Ambulatory Visit: Payer: 59 | Admitting: Allergy

## 2021-01-17 ENCOUNTER — Other Ambulatory Visit: Payer: Self-pay

## 2021-01-17 ENCOUNTER — Ambulatory Visit (AMBULATORY_SURGERY_CENTER): Payer: 59 | Admitting: *Deleted

## 2021-01-17 VITALS — Ht 69.0 in | Wt 239.0 lb

## 2021-01-17 DIAGNOSIS — Z1211 Encounter for screening for malignant neoplasm of colon: Secondary | ICD-10-CM

## 2021-01-17 MED ORDER — SUPREP BOWEL PREP KIT 17.5-3.13-1.6 GM/177ML PO SOLN: 1.0000 | Freq: Once | ORAL | 0 refills | Status: AC

## 2021-01-17 NOTE — Progress Notes (Signed)

## 2021-01-20 ENCOUNTER — Ambulatory Visit (INDEPENDENT_AMBULATORY_CARE_PROVIDER_SITE_OTHER): Payer: 59 | Admitting: Allergy & Immunology

## 2021-01-20 ENCOUNTER — Other Ambulatory Visit: Payer: Self-pay

## 2021-01-20 ENCOUNTER — Encounter: Payer: Self-pay | Admitting: Allergy & Immunology

## 2021-01-20 VITALS — BP 122/84 | HR 69 | Temp 98.2°F | Resp 12 | Ht 68.0 in | Wt 238.0 lb

## 2021-01-20 DIAGNOSIS — J310 Chronic rhinitis: Secondary | ICD-10-CM

## 2021-01-20 DIAGNOSIS — J302 Other seasonal allergic rhinitis: Secondary | ICD-10-CM | POA: Diagnosis not present

## 2021-01-20 DIAGNOSIS — J3089 Other allergic rhinitis: Secondary | ICD-10-CM

## 2021-01-20 DIAGNOSIS — R0602 Shortness of breath: Secondary | ICD-10-CM | POA: Diagnosis not present

## 2021-01-20 MED ORDER — XHANCE 93 MCG/ACT NA EXHU: 2.0000 | INHALANT_SUSPENSION | Freq: Two times a day (BID) | NASAL | 11 refills | Status: DC

## 2021-01-20 NOTE — Patient Instructions (Addendum)
1. SOB (shortness of breath) - Lung testing actually looked fine today. - We are going to start you on a daily controller medication to see if this helps at all (we have samples of Trelegy, so we will use this). - Daily controller medication(s): Singulair 10mg  daily and Trelegy 200/62.5/25 one puff once daily - Prior to physical activity: albuterol 2 puffs 10-15 minutes before physical activity. - Rescue medications: albuterol 4 puffs every 4-6 hours as needed - Asthma control goals:  * Full participation in all desired activities (may need albuterol before activity) * Albuterol use two time or less a week on average (not counting use with activity) * Cough interfering with sleep two time or less a month * Oral steroids no more than once a year * No hospitalizations  2. Chronic rhinitis - Testing today showed: grasses, ragweed, trees, indoor molds, outdoor molds, dust mites and cockroach (but nothing was super reactive as I was expecting). - Copy of test results provided. - Avoidance measures provided. - Stop taking: Flonase - Continue with: Zyrtec (cetirizine) 10mg  tablet once daily, Singulair (montelukast) 10mg  daily and Astelin (azelastine) 2 sprays per nostril 1-2 times daily as needed - Start taking: Xhance (fluticasone) 1-2 sprays per nostril twice daily - You can use an extra dose of the antihistamine, if needed, for breakthrough symptoms.  - Consider nasal saline rinses 1-2 times daily to remove allergens from the nasal cavities as well as help with mucous clearance (this is especially helpful to do before the nasal sprays are given) - Consider allergy shots as a means of long-term control. - Allergy shots "re-train" and "reset" the immune system to ignore environmental allergens and decrease the resulting immune response to those allergens (sneezing, itchy watery eyes, runny nose, nasal congestion, etc).    - Allergy shots improve symptoms in 75-85% of patients.  - We can discuss  more at the next appointment if the medications are not working for you.  3. Return in about 4 weeks (around 02/17/2021).    Please inform us of any Emergency Department visits, hospitalizations, or changes in symptoms. Call us before going to the ED for breathing or allergy symptoms since we might be able to fit you in for a sick visit. Feel free to contact us anytime with any questions, problems, or concerns.  It was a pleasure to meet you today!  Websites that have reliable patient information: 1. American Academy of Asthma, Allergy, and Immunology: www.aaaai.org 2. Food Allergy Research and Education (FARE): foodallergy.org 3. Mothers of Asthmatics: http://www.asthmacommunitynetwork.org 4. American College of Allergy, Asthma, and Immunology: www.acaai.org   COVID-19 Vaccine Information can be found at: [email or web address].[email or web address].uk For questions related to vaccine distribution or appointments, please email [email or web address] or call [phone].   We realize that you might be concerned about having an allergic reaction to the COVID19 vaccines. To help with that concern, WE ARE OFFERING THE COVID19 VACCINES IN OUR OFFICE! Ask the front desk for dates!     "Like" [place] on Facebook and Instagram for our latest updates!      A healthy democracy works best when [employer] participate! Make sure you are registered to vote! If you have moved or changed any of your contact information, you will need to get this updated before voting!  In some cases, you MAY be able to register to vote online: [email or web address]     . Control Negative   [place] 2+   Bold Negative   7 Grass Negative   Ragweed mix 2+  Weed mix Negative   Tree mix 2+   Mold 1 1+   Mold 2 Negative   Mold 3 1+   Mold 4 1+   Cat 1+   Dog Negative   Cockroach 2+   Mite mix 2+     Reducing Pollen Exposure  The American Academy  of Allergy, Asthma and Immunology suggests the following steps to reduce your exposure to pollen during allergy seasons.    1. Do not hang sheets or clothing out to dry; pollen may collect on these items. 2. Do not mow lawns or spend time around freshly cut grass; mowing stirs up pollen. 3. Keep windows closed at night.  Keep car windows closed while driving. 4. Minimize morning activities outdoors, a time when pollen counts are usually at their highest. 5. Stay indoors as much as possible when pollen counts or humidity is high and on windy days when pollen tends to remain in the air longer. 6. Use air conditioning when possible.  Many air conditioners have filters that trap the pollen spores. 7. Use a HEPA room air filter to remove pollen form the indoor air you breathe.  Control of Mold Allergen   Mold and fungi can grow on a variety of surfaces provided certain temperature and moisture conditions exist.  Outdoor molds grow on plants, decaying vegetation and soil.  The major outdoor mold, Alternaria and Cladosporium, are found in very high numbers during hot and dry conditions.  Generally, a late Summer - Fall peak is seen for common outdoor fungal spores.  Rain will temporarily lower outdoor mold spore count, but counts rise rapidly when the rainy period ends.  The most important indoor molds are Aspergillus and Penicillium.  Dark, humid and poorly ventilated basements are ideal sites for mold growth.  The next most common sites of mold growth are the bathroom and the kitchen.  Outdoor (Seasonal) Mold Control  Positive outdoor molds via skin testing: Alternaria, Cladosporium, Bipolaris (Helminthsporium), Drechslera (Curvalaria) and Mucor  1. Use air conditioning and keep windows closed 2. Avoid exposure to decaying vegetation. 3. Avoid leaf raking. 4. Avoid grain handling. 5. Consider wearing a face mask if working in moldy areas.    Indoor (Perennial) Mold Control   Positive indoor  molds via skin testing: Fusarium, Aureobasidium (Pullulara) and Rhizopus  1. Maintain humidity below 50%. 2. Clean washable surfaces with 5% bleach solution. 3. Remove sources e.g. contaminated carpets.     Control of Dust Mite Allergen    Dust mites play a major role in allergic asthma and rhinitis.  They occur in environments with high humidity wherever human skin is found.  Dust mites absorb humidity from the atmosphere (ie, they do not drink) and feed on organic matter (including shed human and animal skin).  Dust mites are a microscopic type of insect that you cannot see with the naked eye.  High levels of dust mites have been detected from mattresses, pillows, carpets, upholstered furniture, bed covers, clothes, soft toys and any woven material.  The principal allergen of the dust mite is found in its feces.  A gram of dust may contain 1,000 mites and 250,000 fecal particles.  Mite antigen is easily measured in the air during house cleaning activities.  Dust mites do not bite and do not cause harm to humans, other than by triggering allergies/asthma.    Ways to decrease your exposure to dust mites in your home:  1. Encase mattresses, box springs and pillows with a mite-impermeable  barrier or cover   2. Wash sheets, blankets and drapes weekly in hot water (130 F) with detergent and dry them in a dryer on the hot setting.  3. Have the room cleaned frequently with a vacuum cleaner and a damp dust-mop.  For carpeting or rugs, vacuuming with a vacuum cleaner equipped with a high-efficiency particulate air (HEPA) filter.  The dust mite allergic individual should not be in a room which is being cleaned and should wait 1 hour after cleaning before going into the room. 4. Do not sleep on upholstered furniture (eg, couches).   5. If possible removing carpeting, upholstered furniture and drapery from the home is ideal.  Horizontal blinds should be eliminated in the rooms where the person spends the  most time (bedroom, study, television room).  Washable vinyl, roller-type shades are optimal. 6. Remove all non-washable stuffed toys from the bedroom.  Wash stuffed toys weekly like sheets and blankets above.   7. Reduce indoor humidity to less than 50%.  Inexpensive humidity monitors can be purchased at most hardware stores.  Do not use a humidifier as can make the problem worse and are not recommended.  Control of Cockroach Allergen  Cockroach allergen has been identified as an important cause of acute attacks of asthma, especially in urban settings.  There are fifty-five species of cockroach that exist in the [place], however only three, the [place], [place] species produce allergen that can affect patients with Asthma.  Allergens can be obtained from fecal particles, egg casings and secretions from cockroaches.    1. Remove food sources. 2. Reduce access to water. 3. Seal access and entry points. 4. Spray runways with 0.5-1% Diazinon or Chlorpyrifos 5. Blow boric acid power under stoves and refrigerator. 6. Place bait stations (hydramethylnon) at feeding sites.
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
## 2021-01-23 ENCOUNTER — Encounter: Payer: Self-pay | Admitting: Allergy & Immunology

## 2021-01-27 ENCOUNTER — Encounter: Payer: Self-pay | Admitting: Family Medicine
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
## 2021-02-01 ENCOUNTER — Other Ambulatory Visit: Payer: Self-pay

## 2021-02-01 ENCOUNTER — Ambulatory Visit (AMBULATORY_SURGERY_CENTER): Payer: 59 | Admitting: Gastroenterology

## 2021-02-01 ENCOUNTER — Other Ambulatory Visit: Payer: Self-pay | Admitting: Gastroenterology

## 2021-02-01 ENCOUNTER — Encounter: Payer: Self-pay | Admitting: Gastroenterology

## 2021-02-01 VITALS — BP 120/66 | HR 66 | Temp 98.9°F | Resp 22 | Ht 69.0 in | Wt 239.0 lb

## 2021-02-01 DIAGNOSIS — K641 Second degree hemorrhoids: Secondary | ICD-10-CM

## 2021-02-01 DIAGNOSIS — Z1211 Encounter for screening for malignant neoplasm of colon: Secondary | ICD-10-CM

## 2021-02-01 DIAGNOSIS — K621 Rectal polyp: Secondary | ICD-10-CM

## 2021-02-01 DIAGNOSIS — D128 Benign neoplasm of rectum: Secondary | ICD-10-CM

## 2021-02-01 DIAGNOSIS — K5730 Diverticulosis of large intestine without perforation or abscess without bleeding: Secondary | ICD-10-CM

## 2021-02-01 MED ORDER — SODIUM CHLORIDE 0.9 % IV SOLN: 500.0000 mL | Freq: Once | INTRAVENOUS | Status: DC

## 2021-02-01 NOTE — Progress Notes (Signed)
Pt's states no medical or surgical changes since previsit or office visit.  Cw vitals and CM Iv.

## 2021-02-01 NOTE — Progress Notes (Signed)
Called to room to assist during endoscopic procedure.  Patient ID and intended procedure confirmed with present staff. Received instructions for my participation in the procedure from the performing physician.  

## 2021-02-01 NOTE — Progress Notes (Signed)
pt tolerated well. VSS. awake and to recovery. Report given to RN.  

## 2021-02-01 NOTE — Patient Instructions (Signed)
Handouts given for polyps, hemorrhoids and diverticulosis.   YOU HAD AN ENDOSCOPIC PROCEDURE TODAY AT [facility]:   Refer to the procedure report that was given to you for any specific questions about what was found during the examination.  If the procedure report does not answer your questions, please call your gastroenterologist to clarify.  If you requested that your care partner not be given the details of your procedure findings, then the procedure report has been included in a sealed envelope for you to review at your convenience later.  YOU SHOULD EXPECT: Some feelings of bloating in the abdomen. Passage of more gas than usual.  Walking can help get rid of the air that was put into your GI tract during the procedure and reduce the bloating. If you had a lower endoscopy (such as a colonoscopy or flexible sigmoidoscopy) you may notice spotting of blood in your stool or on the toilet paper. If you underwent a bowel prep for your procedure, you may not have a normal bowel movement for a few days.  Please Note:  You might notice some irritation and congestion in your nose or some drainage.  This is from the oxygen used during your procedure.  There is no need for concern and it should clear up in a day or so.  SYMPTOMS TO REPORT IMMEDIATELY:   Following lower endoscopy (colonoscopy):  Excessive amounts of blood in the stool  Significant tenderness or worsening of abdominal pains  Swelling of the abdomen that is new, acute  Fever of 100F or higher  For urgent or emergent issues, a gastroenterologist can be reached at any hour by calling [phone]. Do not use MyChart messaging for urgent concerns.    DIET:  We do recommend a small meal at first, but then you may proceed to your regular diet.  Drink plenty of fluids but you should avoid alcoholic beverages for 24 hours.  ACTIVITY:  You should plan to take it easy for the rest of today and you should NOT DRIVE or use  heavy machinery until tomorrow (because of the sedation medicines used during the test).    FOLLOW UP: Our staff will call the number listed on your records 48-72 hours following your procedure to check on you and address any questions or concerns that you may have regarding the information given to you following your procedure. If we do not reach you, we will leave a message.  We will attempt to reach you two times.  During this call, we will ask if you have developed any symptoms of COVID 19. If you develop any symptoms (ie: fever, flu-like symptoms, shortness of breath, cough etc.) before then, please call [phone].  If you test positive for Covid 19 in the 2 weeks post procedure, please call and report this information to [place].    If any biopsies were taken you will be contacted by phone or by letter within the next 1-3 weeks.  Please call us at [phone] if you have not heard about the biopsies in 3 weeks.    SIGNATURES/CONFIDENTIALITY: You and/or your care partner have signed paperwork which will be entered into your electronic medical record.  These signatures attest to the fact that that the information above on your After Visit Summary has been reviewed and is understood.  Full responsibility of the confidentiality of this discharge information lies with you and/or your care-partner.

## 2021-02-01 NOTE — Op Note (Signed)
[place] Patient Name: Joanne Kim Procedure Date: 02/01/2021 3:26 PM MRN: [id number] Endoscopist: Gerrit Heck , MD Age: 60 Referring MD:  Date of Birth: 03-21-1961 Gender: Female Account #: [id number] Procedure:                Colonoscopy Indications:              Screening for colorectal malignant neoplasm (last                            colonoscopy was 10 years ago) Medicines:                Monitored Anesthesia Care Procedure:                Pre-Anesthesia Assessment:                           - Prior to the procedure, a History and Physical                            was performed, and patient medications and                            allergies were reviewed. The patient's tolerance of                            previous anesthesia was also reviewed. The risks                            and benefits of the procedure and the sedation                            options and risks were discussed with the patient.                            All questions were answered, and informed consent                            was obtained. Prior Anticoagulants: The patient has                            taken no previous anticoagulant or antiplatelet                            agents. ASA Grade Assessment: II - A patient with                            mild systemic disease. After reviewing the risks                            and benefits, the patient was deemed in                            satisfactory condition to undergo the procedure.  After obtaining informed consent, the colonoscope                            was passed under direct vision. Throughout the                            procedure, the patient's blood pressure, pulse, and                            oxygen saturations were monitored continuously. The                            Olympus [id number]) Colonoscope was                            introduced through the anus and  advanced to the the                            terminal ileum. The colonoscopy was performed                            without difficulty. The patient tolerated the                            procedure well. The quality of the bowel                            preparation was good. The terminal ileum, ileocecal                            valve, appendiceal orifice, and rectum were                            photographed. Scope In: 3:42:01 PM Scope Out: 3:55:40 PM Scope Withdrawal Time: 0 hours 10 minutes 31 seconds  Total Procedure Duration: 0 hours 13 minutes 39 seconds  Findings:                 Skin tags were found on perianal exam.                           Two sessile polyps were found in the rectum. The                            polyps were 1 to 2 mm in size. These polyps were                            removed with a cold biopsy forceps. Resection and                            retrieval were complete. Estimated blood loss was                            minimal.  A few small-mouthed diverticula were found in the                            sigmoid colon.                           Non-bleeding internal hemorrhoids were found during                            retroflexion. The hemorrhoids were medium-sized and                            Grade II (internal hemorrhoids that prolapse but                            reduce spontaneously).                           The exam was otherwise normal throughout the                            remainder of the colon.                           The terminal ileum appeared normal. Complications:            No immediate complications. Estimated Blood Loss:     Estimated blood loss was minimal. Impression:               - Perianal skin tags found on perianal exam.                           - Two 1 to 2 mm polyps in the rectum, removed with                            a cold biopsy forceps. Resected and retrieved.                            - Diverticulosis in the sigmoid colon.                           - Non-bleeding internal hemorrhoids.                           - The examined portion of the ileum was normal. Recommendation:           - Patient has a contact number available for                            emergencies. The signs and symptoms of potential                            delayed complications were discussed with the                            patient. Return to normal activities tomorrow.  Written discharge instructions were provided to the                            patient.                           - Resume previous diet.                           - Continue present medications.                           - Await pathology results.                           - Repeat colonoscopy in 5-10 years for surveillance                            based on pathology results.                           - Return to GI office PRN.                           - Use fiber, for example Citrucel, Fibercon, Konsyl                            or Metamucil.                           - Internal hemorrhoids were noted on this study and                            may be amenable to hemorrhoid band ligation. If you                            are interested in further treatment of these                            hemorrhoids with band ligation, please contact my                            clinic to set up an appointment for evaluation and                            treatment. Gerrit Heck, MD 02/01/2021 3:59:16 PM

## 2021-02-02 ENCOUNTER — Ambulatory Visit: Payer: 59 | Admitting: Family Medicine

## 2021-02-02 ENCOUNTER — Telehealth: Payer: 59 | Admitting: Family

## 2021-02-02 DIAGNOSIS — J019 Acute sinusitis, unspecified: Secondary | ICD-10-CM | POA: Diagnosis not present

## 2021-02-02 MED ORDER — AMOXICILLIN-POT CLAVULANATE 875-125 MG PO TABS: 1.0000 | ORAL_TABLET | Freq: Two times a day (BID) | ORAL | 0 refills | Status: DC

## 2021-02-02 NOTE — Progress Notes (Signed)

## 2021-02-03 ENCOUNTER — Telehealth: Payer: Self-pay | Admitting: General Surgery

## 2021-02-03 ENCOUNTER — Telehealth: Payer: Self-pay | Admitting: *Deleted

## 2021-02-03 NOTE — Telephone Encounter (Signed)
-----   Message from Del Aire, DO sent at 02/01/2021  3:59 PM EDT ----- Please set this patient up for hemorrhoid banding, next routine opening. Thanks.

## 2021-02-03 NOTE — Telephone Encounter (Signed)
  Follow up Call-  Call back number 02/01/2021  Post procedure Call Back phone  # [phone]  Permission to leave phone message Yes  Some recent data might be hidden     Patient questions:  Do you have a fever, pain , or abdominal swelling? No. Pain Score  0 *  Have you tolerated food without any problems? Yes.    Have you been able to return to your normal activities? Yes.    Do you have any questions about your discharge instructions: Diet   No. Medications  No. Follow up visit  No.  Do you have questions or concerns about your Care? No.  Actions: * If pain score is 4 or above: No action needed, pain <4

## 2021-02-03 NOTE — Telephone Encounter (Signed)
Left a voicemail for the patient to call and schedule her 1st hemorrhoid banding appointment.

## 2021-02-11 NOTE — Telephone Encounter (Signed)
Left a detailed voicemail for patient to contact me to schedule a hemorrhoid banding. Also expressed that I would send her a message through [place].

## 2021-02-16 ENCOUNTER — Encounter: Payer: Self-pay | Admitting: Gastroenterology

## 2021-03-10 ENCOUNTER — Telehealth: Payer: 59 | Admitting: Physician Assistant

## 2021-03-10 DIAGNOSIS — J029 Acute pharyngitis, unspecified: Secondary | ICD-10-CM

## 2021-03-10 DIAGNOSIS — Z20822 Contact with and (suspected) exposure to covid-19: Secondary | ICD-10-CM

## 2021-03-10 MED ORDER — AMOXICILLIN 500 MG PO TABS: 500.0000 mg | ORAL_TABLET | Freq: Two times a day (BID) | ORAL | 0 refills | Status: DC

## 2021-03-10 NOTE — Progress Notes (Signed)
i Your current symptoms could be consistent with the coronavirus.  Many health care providers can now test patients at their office but not all are.  [facility] has multiple testing sites. For information on our [place] testing locations and hours go to [email or web address]  We are enrolling you in our [place] for [place] . Daily you will receive a questionnaire within the [facility] website. Our COVID 19 response team will be monitoring your responses daily.  Testing Information: The COVID-19 Community Testing sites are testing BY APPOINTMENT ONLY.  You can schedule online at [email or web address]  If you do not have access to a smart phone or computer you may call [phone] for an appointment.   Additional testing sites in the Community:  . For [facility] Testing sites in [facility]  FaceUpdate.uy  . For Pop-up testing sites in [place]  BowlDirectory.co.uk  . For [facility] [email or web address]  . For [facility] testing in [place] and [employer] [email or web address]  . For [facility] testing in [facility]   [email or web address]  For  more information about community testing call [phone]   Please quarantine yourself while awaiting your test results. Please stay home for a minimum of 10 days from the first day of illness with improving symptoms and you have had 24 hours of no fever (without the use of Tylenol (Acetaminophen) Motrin (Ibuprofen) or any fever reducing medication).  Also - Do not get tested prior to returning to work because once you have had a positive test the test can stay positive for more than a month in some  cases.   You should wear a mask or cloth face covering over your nose and mouth if you must be around other people or animals, including pets (even at home). Try to stay at least 6 feet away from other people. This will protect the people around you.  Please continue good preventive care measures, including:  frequent hand-washing, avoid touching your face, cover coughs/sneezes, stay out of crowds and keep a 6 foot distance from others.  COVID-19 is a respiratory illness with symptoms that are similar to the flu. Symptoms are typically mild to moderate, but there have been cases of severe illness and death due to the virus.   The following symptoms may appear 2-14 days after exposure: . Fever . Cough . Shortness of breath or difficulty breathing . Chills . Repeated shaking with chills . Muscle pain . Headache . Sore throat . New loss of taste or smell . Fatigue . Congestion or runny nose . Nausea or vomiting . Diarrhea  Go to the nearest hospital ED for assessment if fever/cough/breathlessness are severe or illness seems like a threat to life.  It is vitally important that if you feel that you have an infection such as this virus or any other virus that you stay home and away from places where you may spread it to others.  You should avoid contact with people age 59 and older.   You can use medication such as: Chloraseptic Spray or Oragel OTC You can also do salt water gargles every 4 hours for sore throat as well.  You may also take acetaminophen (Tylenol) as needed for fever.  Reduce your risk of any infection by using the same precautions used for avoiding the common cold or flu:  Mar[place] Kitchen Wash your hands often with soap and warm water for at least 20 seconds.  If soap and water are not readily available, use an alcohol-based hand  sanitizer with at least 60% alcohol.  . If coughing or sneezing, cover your mouth and nose by coughing or sneezing into the elbow areas of your shirt or coat, into  a tissue or into your sleeve (not your hands). . Avoid shaking hands with others and consider head nods or verbal greetings only. . Avoid touching your eyes, nose, or mouth with unwashed hands.  . Avoid close contact with people who are sick. . Avoid places or events with large numbers of people in one location, like concerts or sporting events. . Carefully consider travel plans you have or are making. . If you are planning any travel outside or inside the [place], visit the CDC's Travelers' Health webpage for the latest health notices. . If you have some symptoms but not all symptoms, continue to monitor at home and seek medical attention if your symptoms worsen. . If you are having a medical emergency, call 911.  HOME CARE . Only take medications as instructed by your medical team. . Drink plenty of fluids and get plenty of rest. . A steam or ultrasonic humidifier can help if you have congestion.   GET HELP RIGHT AWAY IF YOU HAVE EMERGENCY WARNING SIGNS** FOR COVID-19. If you or someone is showing any of these signs seek emergency medical care immediately. Call 911 or proceed to your closest emergency facility if: . You develop worsening high fever. . Trouble breathing . Bluish lips or face . Persistent pain or pressure in the chest . New confusion . Inability to wake or stay awake . You cough up blood. . Your symptoms become more severe  **This list is not all possible symptoms. Contact your medical provider for any symptoms that are sever or concerning to you.  MAKE SURE YOU   Understand these instructions.  Will watch your condition.  Will get help right away if you are not doing well or get worse.  Your e-visit answers were reviewed by a board certified advanced clinical practitioner to complete your personal care plan.  Depending on the condition, your plan could have included both over the counter or prescription medications.  If there is a problem please reply once you have  received a response from your provider.  Your safety is important to [place].  If you have drug allergies check your prescription carefully.    You can use MyChart to ask questions about today's visit, request a non-urgent call back, or ask for a work or school excuse for 24 hours related to this e-Visit. If it has been greater than 24 hours you will need to follow up with your provider, or enter a new e-Visit to address those concerns. You will get an e-mail in the next two days asking about your experience.  I hope that your e-visit has been valuable and will speed your recovery. Thank you for using e-visits.  I provided 5 minutes of non face-to-face time during this encounter for chart review and documentation.

## 2021-03-10 NOTE — Progress Notes (Signed)
We are sorry that you are not feeling well.  Here is how we plan to help!  Based on what you have shared with me it is likely that you have  pharyngitis.  Pharyngitis is inflammation and infection in the back of the throat.  In your case giving absence of other upper respiratory symptoms, swollen glands in the front of your neck and fever, I am concerned about a bacterial cause.   I have prescribed Amoxicillin 500 mg twice a day for 10 days. For throat pain, we recommend over the counter oral pain relief medications such as acetaminophen or aspirin, or anti-inflammatory medications such as ibuprofen or naproxen sodium. Topical treatments such as oral throat lozenges or sprays may be used as needed. Strep infections are not as easily transmitted as other respiratory infections, however we still recommend that you avoid close contact with loved ones, especially the very young and elderly.  Remember to wash your hands thoroughly throughout the day as this is the number one way to prevent the spread of infection and wipe down door knobs and counters with disinfectant.   Home Care:  Only take medications as instructed by your medical team.  Complete the entire course of an antibiotic.  Do not take these medications with alcohol.  A steam or ultrasonic humidifier can help congestion.  You can place a towel over your head and breathe in the steam from hot water coming from a faucet.  Avoid close contacts especially the very young and the elderly.  Cover your mouth when you cough or sneeze.  Always remember to wash your hands.  Get Help Right Away If:  You develop worsening fever or sinus pain.  You develop a severe head ache or visual changes.  Your symptoms persist after you have completed your treatment plan.  Make sure you  Understand these instructions.  Will watch your condition.  Will get help right away if you are not doing well or get worse.  Your e-visit answers were reviewed  by a board certified advanced clinical practitioner to complete your personal care plan.  Depending on the condition, your plan could have included both over the counter or prescription medications.  If there is a problem please reply  once you have received a response from your provider.  Your safety is important to [place].  If you have drug allergies check your prescription carefully.    You can use MyChart to ask questions about today's visit, request a non-urgent call back, or ask for a work or school excuse for 24 hours related to this e-Visit. If it has been greater than 24 hours you will need to follow up with your provider, or enter a new e-Visit to address those concerns.  You will get an e-mail in the next two days asking about your experience.  I hope that your e-visit has been valuable and will speed your recovery. Thank you for using e-visits.

## 2021-03-10 NOTE — Progress Notes (Signed)
I have spent 5 minutes in review of e-visit questionnaire, review and updating patient chart, medical decision making and response to patient.   Johnpaul Gillentine Cody Saniah Schroeter, PA-C    

## 2021-03-20 ENCOUNTER — Encounter: Payer: Self-pay | Admitting: Physician Assistant

## 2021-03-20 ENCOUNTER — Telehealth: Payer: 59 | Admitting: Physician Assistant

## 2021-03-20 DIAGNOSIS — J020 Streptococcal pharyngitis: Secondary | ICD-10-CM

## 2021-03-20 DIAGNOSIS — J029 Acute pharyngitis, unspecified: Secondary | ICD-10-CM

## 2021-03-20 MED ORDER — DOXYCYCLINE HYCLATE 100 MG PO TABS: 100.0000 mg | ORAL_TABLET | Freq: Two times a day (BID) | ORAL | 0 refills | Status: DC

## 2021-03-20 NOTE — Patient Instructions (Signed)
Strep Throat, Adult Strep throat is an infection of the throat. It is caused by germs (bacteria). Strep throat is common during the cold months of the year. It mostly affects children who are 49-58 years old. However, people of all ages can get it at anytime of the year. When strep throat affects the tonsils, it is called tonsillitis. When it affects the back of the throat, it is called pharyngitis. This infection spreads from person to person through coughing, sneezing, or having closecontact. What are the causes? This condition is caused by the Streptococcus pyogenesgerm. What increases the risk? You are more likely to develop this condition if: You care for young children. Children are more likely to get strep throat and may spread it to others. You go to crowded places. Germs can spread easily in such places. You kiss or touch someone who has strep throat. What are the signs or symptoms? Symptoms of this condition include: Fever or chills. Redness, swelling, or pain in the tonsils or throat. Pain or trouble when swallowing. White or yellow spots on the tonsils or throat. Tender glands in the neck and under the jaw. Bad breath. Red rash all over the body. This is rare. How is this treated? This condition may be treated with: Medicines that kill germs (antibiotics). Medicines that treat pain or fever. These include: Ibuprofen or acetaminophen. Aspirin, only for patients who are over the age of 73. Throat lozenges. Throat sprays. Follow these instructions at home: Medicines  Take over-the-counter and prescription medicines only as told by your doctor. Take your antibiotic medicine as told by your doctor. Do not stop taking the antibiotic even if you start to feel better.  Eating and drinking  If you have trouble swallowing, eat soft foods until your throat feels better. Drink enough fluid to keep your pee (urine) pale yellow. To help with pain, you may have: Warm fluids, such as  soup and tea. Cold fluids, such as frozen desserts or popsicles.  General instructions Rinse your mouth (gargle) with a salt-water mixture 3-4 times a day or as needed. To make a salt-water mixture, dissolve -1 tsp (3-6 g) of salt in 1 cup (237 mL) of warm water. Rest as much as you can. Stay home from work or school until you have been taking antibiotics for 24 hours. Avoid smoking or being around people who smoke. Keep all follow-up visits as told by your doctor. This is important. How is this prevented?  Do not share food, drinking cups, or personal items. They can cause the germs to spread. Wash your hands well with soap and water. Make sure that all people in your house wash their hands well. Have family members tested if they have a fever or a sore throat. They may need an antibiotic if they have strep throat.  Contact a doctor if: You have swelling in your neck that keeps getting bigger. You get a rash, cough, or earache. You cough up a thick fluid that is green, yellow-brown, or bloody. You have pain that does not get better with medicine. Your symptoms get worse instead of getting better. You have a fever. Get help right away if: You vomit. You have a very bad headache. Your neck hurts or feels stiff. You have chest pain or are short of breath. You have drooling, very bad throat pain, or changes in your voice. Your neck is swollen, or the skin gets red and tender. Your mouth is dry, or you are peeing less than normal.  You keep feeling more tired or have trouble waking up. Your joints are red or painful. Summary Strep throat is an infection of the throat. It is caused by germs (bacteria). This infection can spread from person to person through coughing, sneezing, or having close contact. Take your medicines, including antibiotics, as told by your doctor. Do not stop taking the antibiotic even if you start to feel better. To prevent the spread of germs, wash your hands  well with soap and water. Have others do the same. Do not share food, drinking cups, or personal items. Get help right away if you have a bad headache, chest pain, shortness of breath, a stiff or painful neck, or you vomit. This information is not intended to replace advice given to you by your health care provider. Make sure you discuss any questions you have with your healthcare provider. Document Revised: 12/13/2018 Document Reviewed: 12/13/2018 Elsevier Patient Education  [place].

## 2021-03-20 NOTE — Progress Notes (Signed)
Ms. ximena, todaro are scheduled for a virtual visit with your provider today.    Just as we do with appointments in the office, we must obtain your consent to participate.  Your consent will be active for this visit and any virtual visit you may have with one of our providers in the next 365 days.    If you have a MyChart account, I can also send a copy of this consent to you electronically.  All virtual visits are billed to your insurance company just like a traditional visit in the office.  As this is a virtual visit, video technology does not allow for your provider to perform a traditional examination.  This may limit your provider's ability to fully assess your condition.  If your provider identifies any concerns that need to be evaluated in person or the need to arrange testing such as labs, EKG, etc, we will make arrangements to do so.    Although advances in technology are sophisticated, we cannot ensure that it will always work on either your end or our end.  If the connection with a video visit is poor, we may have to switch to a telephone visit.  With either a video or telephone visit, we are not always able to ensure that we have a secure connection.   I need to obtain your verbal consent now.   Are you willing to proceed with your visit today?   [name] has provided verbal consent on 03/20/2021 for a virtual visit (video or telephone).   Mar Daring, PA-C 03/20/2021  4:21 PM    MyChart Video Visit    Virtual Visit via Video Note   This visit type was conducted due to national recommendations for restrictions regarding the COVID-19 Pandemic (e.g. social distancing) in an effort to limit this patient's exposure and mitigate transmission in our community. This patient is at least at moderate risk for complications without adequate follow up. This format is felt to be most appropriate for this patient at this time. Physical exam was limited by quality of the video and  audio technology used for the visit.   Patient location: Home Provider location: Home office in [place]  I discussed the limitations of evaluation and management by telemedicine and the availability of in person appointments. The patient expressed understanding and agreed to proceed.  Patient: Joanne Kim   DOB: Aug 06, 1961   60 y.o. Female  MRN: [id number] Visit Date: 03/20/2021  Today's healthcare provider: Mar Daring, PA-C   No chief complaint on file.  Subjective    Sore Throat  This is a recurrent problem. The current episode started 1 to 4 weeks ago. The problem has been gradually worsening. Neither side of throat is experiencing more pain than the other.     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   Azelastine HCl 137 MCG/SPRAY SOLN PLACE 2 SPRAYS INTO BOTH NOSTRILS 2 TIMES DAILY AS NEEDED   cholecalciferol (VITAMIN  D3) 25 MCG (1000 UNIT) tablet Take 1,000 Units by mouth daily.   cyclobenzaprine (FLEXERIL) 10 MG tablet TAKE 1/2-1 TABLET (5-10 MG TOTAL) BY MOUTH 2 (TWO) TIMES DAILY AS NEEDED FOR MUSCLE SPASMS.   fluticasone (FLONASE) 50 MCG/ACT nasal spray PLACE 2 SPRAYS INTO BOTH NOSTRILS DAILY   Fluticasone Propionate (XHANCE) 93 MCG/ACT EXHU Place 2 sprays into both nostrils in the morning and at bedtime.   levocetirizine (XYZAL) 5 MG tablet TAKE 1 TABLET BY MOUTH EVERY EVENING   MAGNESIUM CITRATE PO Take by mouth daily. 165 mg daily   montelukast (SINGULAIR) 10 MG tablet TAKE 1 TABLET BY MOUTH  EVERY NIGHT AT BEDTIME AS NEEDED FOR ALLERGIES   No facility-administered medications prior to visit.    Review of Systems  Last CBC Lab Results  Component Value Date   WBC 5.0 11/22/2020   HGB 13.4 11/22/2020   HCT 41.1 11/22/2020   MCV 84.5 11/22/2020   MCH 30.0 11/06/2015   RDW 17.7 (H) 11/22/2020   PLT 143.0 (L) 17/40/8144   Last metabolic panel Lab Results  Component Value Date   GLUCOSE 84 11/22/2020   NA 140 11/22/2020   K 4.1 11/22/2020   CL 106 11/22/2020   CO2 29 11/22/2020   BUN 10 11/22/2020   CREATININE 0.79 11/22/2020   GFRNONAA >60 11/06/2015   GFRAA >60 11/06/2015   CALCIUM 9.4 11/22/2020   PROT 6.8 11/22/2020   ALBUMIN 3.8 11/22/2020   BILITOT 0.7 11/22/2020   ALKPHOS 81 11/22/2020   AST 20 11/22/2020   ALT 13 11/22/2020   ANIONGAP 12 11/06/2015      Objective    LMP 10/12/2016  BP Readings from Last 3 Encounters:  02/01/21 120/66  01/20/21 122/84  12/15/20 112/68   Wt Readings from Last 3 Encounters:  02/01/21 239 lb (108.4 kg)  01/20/21 238 lb (108 kg)  01/17/21 239 lb (108.4 kg)      Physical Exam     Assessment & Plan     1. Sore throat Patient is going to take at home covid test and will start new visit based on results   No follow-ups on file.     I discussed the assessment and treatment plan with the patient. The patient was provided an opportunity to ask questions and all were answered. The patient agreed with the plan and demonstrated an understanding of the instructions.   The patient was advised to call back or seek an in-person evaluation if the symptoms worsen or if the condition fails to improve as anticipated.  I provided 0 minutes of face-to-face time during this encounter via MyChart Video enabled encounter.   Rubye Beach Snook [phone] (phone) [phone] (fax)  [place]

## 2021-03-20 NOTE — Progress Notes (Signed)
[name], sperl are scheduled for a virtual visit with your provider today.    Just as we do with appointments in the office, we must obtain your consent to participate.  Your consent will be active for this visit and any virtual visit you may have with one of our providers in the next 365 days.    If you have a MyChart account, I can also send a copy of this consent to you electronically.  All virtual visits are billed to your insurance company just like a traditional visit in the office.  As this is a virtual visit, video technology does not allow for your provider to perform a traditional examination.  This may limit your provider's ability to fully assess your condition.  If your provider identifies any concerns that need to be evaluated in person or the need to arrange testing such as labs, EKG, etc, we will make arrangements to do so.    Although advances in technology are sophisticated, we cannot ensure that it will always work on either your end or our end.  If the connection with a video visit is poor, we may have to switch to a telephone visit.  With either a video or telephone visit, we are not always able to ensure that we have a secure connection.   I need to obtain your verbal consent now.   Are you willing to proceed with your visit today?   [name] has provided verbal consent on 03/20/2021 for a virtual visit (video or telephone).   Mar Daring, PA-C 03/20/2021  5:15 PM    MyChart Video Visit    Virtual Visit via Video Note   This visit type was conducted due to national recommendations for restrictions regarding the COVID-19 Pandemic (e.g. social distancing) in an effort to limit this patient's exposure and mitigate transmission in our community. This patient is at least at moderate risk for complications without adequate follow up. This format is felt to be most appropriate for this patient at this time. Physical exam was limited by quality of the video and  audio technology used for the visit.   Patient location: Home Provider location: Home office in [place]  I discussed the limitations of evaluation and management by telemedicine and the availability of in person appointments. The patient expressed understanding and agreed to proceed.  Patient: Joanne Kim   DOB: 1961-02-06   60 y.o. Female  MRN: [id number] Visit Date: 03/20/2021  Today's healthcare provider: Mar Daring, PA-C   No chief complaint on file.  Subjective    HPI  Joanne Kim is a 60 yr old female that presents today for returning symptoms of sore throat, body aches, headache, and chills. She was treated for Strep Throat on 03/10/21 with Amoxicillin 500mg  BID x 10 days. She reports she still has one dose left, but this morning awoke with body aches and chills. She has since developed a headache and noticed her throat is starting to feel sore and scratchy like previously. She does report she had noticed complete symptom relief before starting to feel bad again. She does have one remaining dose of Amoxicillin for tonight. She did attend a graduation over the weekend, but does not know if anyone was sick. She has taken an at home Covid 19 test that is negative.  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   Azelastine HCl 137 MCG/SPRAY SOLN PLACE 2 SPRAYS INTO BOTH NOSTRILS 2 TIMES DAILY AS NEEDED   cholecalciferol (VITAMIN D3) 25 MCG (1000 UNIT) tablet Take 1,000 Units by mouth daily.   cyclobenzaprine  (FLEXERIL) 10 MG tablet TAKE 1/2-1 TABLET (5-10 MG TOTAL) BY MOUTH 2 (TWO) TIMES DAILY AS NEEDED FOR MUSCLE SPASMS.   fluticasone (FLONASE) 50 MCG/ACT nasal spray PLACE 2 SPRAYS INTO BOTH NOSTRILS DAILY   Fluticasone Propionate (XHANCE) 93 MCG/ACT EXHU Place 2 sprays into both nostrils in the morning and at bedtime.   levocetirizine (XYZAL) 5 MG tablet TAKE 1 TABLET BY MOUTH EVERY EVENING   MAGNESIUM CITRATE PO Take by mouth daily. 165 mg daily   montelukast (SINGULAIR) 10 MG tablet TAKE 1 TABLET BY MOUTH EVERY NIGHT AT BEDTIME AS NEEDED FOR ALLERGIES   No facility-administered medications prior to visit.    Review of Systems  Constitutional:  Positive for chills and fatigue. Negative for fever.  HENT:  Positive for sore throat. Negative for congestion.   Respiratory: Negative.    Cardiovascular: Negative.   Musculoskeletal:  Positive for myalgias.  Neurological:  Positive for headaches.   Last CBC Lab Results  Component Value Date   WBC 5.0 11/22/2020   HGB 13.4 11/22/2020   HCT 41.1 11/22/2020   MCV 84.5 11/22/2020   MCH 30.0 11/06/2015   RDW 17.7 (H) 11/22/2020   PLT 143.0 (L) 43/15/4008   Last metabolic panel Lab Results  Component Value Date   GLUCOSE 84 11/22/2020   NA 140 11/22/2020   K 4.1 11/22/2020   CL 106 11/22/2020   CO2 29 11/22/2020   BUN 10 11/22/2020   CREATININE 0.79 11/22/2020   GFRNONAA >60 11/06/2015   GFRAA >60 11/06/2015   CALCIUM 9.4 11/22/2020   PROT 6.8 11/22/2020   ALBUMIN 3.8 11/22/2020   BILITOT 0.7 11/22/2020   ALKPHOS 81 11/22/2020   AST 20 11/22/2020   ALT 13 11/22/2020   ANIONGAP 12 11/06/2015      Objective    LMP 10/12/2016     Physical Exam Vitals reviewed.  Constitutional:      General: She is not in acute distress.    Appearance: Normal appearance. She is well-developed. She is not ill-appearing.  HENT:     Head: Normocephalic and atraumatic.  Pulmonary:     Effort: Pulmonary effort is normal. No respiratory  distress (able to speak in full, coherent sentences without issue).  Musculoskeletal:     Cervical back: Normal range of motion and neck supple.  Neurological:     Mental Status: She is alert.  Psychiatric:        Mood and Affect: Mood normal.        Behavior: Behavior normal.        Thought Content: Thought content normal.        Judgment: Judgment normal.       Assessment & Plan     1. Strep throat - Suspect possible organism resistant to amoxicillin or second sickening.  - Will complete Amoxicillin tonight - Start Doxycycline tomorrow - Tylenol and  Ibuprofen can be alternated every 3 hours as needed for body aches or fevers - Salt water gargles for sore throat - Seek in-person evaluation if symptoms not improving or worsening - doxycycline (VIBRA-TABS) 100 MG tablet; Take 1 tablet (100 mg total) by mouth 2 (two) times daily.  Dispense: 14 tablet; Refill: 0   No follow-ups on file.     I discussed the assessment and treatment plan with the patient. The patient was provided an opportunity to ask questions and all were answered. The patient agreed with the plan and demonstrated an understanding of the instructions.   The patient was advised to call back or seek an in-person evaluation if the symptoms worsen or if the condition fails to improve as anticipated.  I provided 14 minutes of face-to-face time during this encounter via MyChart Video enabled encounter.   Rubye Beach Lorenzo [phone] (phone) [phone] (fax)  [place]

## 2021-03-22 ENCOUNTER — Encounter: Payer: Self-pay | Admitting: Family Medicine

## 2021-03-23 ENCOUNTER — Ambulatory Visit: Payer: 59 | Admitting: Family Medicine

## 2021-03-23 NOTE — Progress Notes (Addendum)
[name] [address], [place], [place] [address] [phone]  Date:  03/24/2021   Name:  Joanne Kim   DOB:  [birth year]   MRN:  [id number]  PCP:  [name], MD    Chief Complaint: Sore Throat (White coating of throat, throat pain, taken amoxicillin for strep throat, hoarseness, chills, aching, headache, diarrhea/)   History of Present Illness:  Joanne Kim is a 60 y.o. very pleasant female patient who presents with the following: Last visit with myself in March - history of glaucoma  Here today with concern of ST She was seen virtually twice 6/2- was thought to have strep due to ST with apparent exudate, given amoxicillin which she completed  6/12- changed to doxycycline.  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   Dry eyes 10/23/2016   Obesity 10/23/2016   RECTAL BLEEDING 12/14/2009    Past Medical History:  Diagnosis Date   Allergy    Elevated blood pressure reading    History of colon polyps    Vaginal Pap smear, abnormal 2017    Past Surgical History:  Procedure Laterality Date   BREAST BIOPSY     BUNIONECTOMY     COLONOSCOPY  2011    Social History   Tobacco Use   Smoking status: Never   Smokeless tobacco: Never  Substance Use Topics   Alcohol use: Yes    Comment: occasionally   Drug use: No    Family History   Problem Relation Age of Onset   Heart disease Other    Stroke Other    Hypertension Other    Breast cancer Maternal Aunt    Sarcoidosis Sister    Allergic rhinitis Sister    Asthma Son    Allergic rhinitis Son    Urticaria Son    Colon polyps Mother    Allergic rhinitis Grandchild    Eczema Grandchild    Urticaria Niece    Colon cancer Neg Hx    Esophageal cancer Neg Hx    Rectal cancer Neg Hx    Stomach cancer Neg Hx    Angioedema Neg Hx    Immunodeficiency Neg Hx     Allergies  Allergen Reactions   Oxycodone Itching    Medication list has been reviewed and updated.  Current Outpatient Medications on File Prior to Visit  Medication Sig Dispense Refill   Azelastine HCl 137 MCG/SPRAY SOLN PLACE 2 SPRAYS INTO BOTH NOSTRILS 2 TIMES DAILY AS NEEDED 30 mL 9   cholecalciferol (VITAMIN D3) 25 MCG (1000 UNIT) tablet Take 1,000 Units by mouth daily.     cyclobenzaprine (FLEXERIL) 10 MG tablet TAKE 1/2-1 TABLET (5-10 MG TOTAL) BY MOUTH 2 (TWO) TIMES DAILY AS NEEDED FOR MUSCLE SPASMS. 20 tablet 0   doxycycline (VIBRA-TABS) 100 MG tablet  Take 1 tablet (100 mg total) by mouth 2 (two) times daily. 14 tablet 0   fluticasone (FLONASE) 50 MCG/ACT nasal spray PLACE 2 SPRAYS INTO BOTH NOSTRILS DAILY 16 g 9   Fluticasone Propionate (XHANCE) 93 MCG/ACT EXHU Place 2 sprays into both nostrils in the morning and at bedtime. 16 mL 11   levocetirizine (XYZAL) 5 MG tablet TAKE 1 TABLET BY MOUTH EVERY EVENING 90 tablet 3   MAGNESIUM CITRATE PO Take by mouth daily. 165 mg daily     montelukast (SINGULAIR) 10 MG tablet TAKE 1 TABLET BY MOUTH EVERY NIGHT AT BEDTIME AS NEEDED FOR ALLERGIES 90 tablet 3   No current facility-administered medications on file prior to visit.    Review of Systems:  As per HPI- otherwise negative.   Physical Examination: Vitals:   03/24/21 1017  BP: 112/72  Pulse: 76  Resp: 18  Temp: (!) 97.2 F (36.2 C)  SpO2: 99%   Vitals:   03/24/21 1017  Weight: 230 lb  (104.3 kg)  Height: 5\' 9"  (1.753 m)   Body mass index is 33.97 kg/m. Ideal Body Weight: Weight in (lb) to have BMI = 25: 168.9  GEN: no acute distress. Obese, looks well  HEENT: Atraumatic, Normocephalic.  Bilateral TM wnl, oropharynx erythematous but no exudate.  PEERL,EOMI.   Ears and Nose: No external deformity. CV: RRR, No M/G/R. No JVD. No thrill. No extra heart sounds. PULM: CTA B, no wheezes, crackles, rhonchi. No retractions. No resp. distress. No accessory muscle use. ABD: S, NT, ND, +BS. No rebound. No HSM. EXTR: No c/c/e PSYCH: Normally interactive. Conversant.  Results for orders placed or performed in visit on 03/24/21  POCT Influenza A/B  Result Value Ref Range   Influenza A, POC Negative Negative   Influenza B, POC Negative Negative  POCT rapid strep A  Result Value Ref Range   Rapid Strep A Screen Negative Negative   Fever, unspecified fever cause - Plan: POCT Influenza A/B, POCT rapid strep A, Novel Coronavirus, NAA ([facility]), CBC, Comprehensive metabolic panel, Sedimentation rate, DG Chest 2 View  Patient seen today with concern of recent illness.  She has noticed fever of 102 degrees 5 days ago, sore throat.  Some cough but this is not a prominent feature.  She did a home COVID test which was negative.  Today we have done a rapid flu and rapid strep which are both negative, also COVID PCR her exam is nonfocal. She is starting to feel better, taking doxycycline  Will obtain labs and chest x-ray as above.  I have asked her to let me know if she is getting worse or not continue to improve in the meantime This visit occurred during the SARS-CoV-2 public health emergency.  Safety protocols were in place, including screening questions prior to the visit, additional usage of staff PPE, and extensive cleaning of exam room while observing appropriate contact time as indicated for disinfecting solutions.   Signed Lamar Blinks, MD  Received her labs and chest film as  below, message to patient DG Chest 2 View  Result Date: 03/24/2021 CLINICAL DATA:  Fever and cough over the last 12 days. EXAM: CHEST - 2 VIEW COMPARISON:  10/27/2019 FINDINGS: Heart size is normal. Mediastinal shadows are normal. There may be minimal central bronchial thickening but there is no infiltrate, collapse or effusion. Bony structures are unremarkable. IMPRESSION: Possible mild bronchitis.  No consolidation or collapse. Electronically Signed   By: Nelson Chimes M.D.   On: 03/24/2021 11:23  Results for orders placed or performed in visit on 03/24/21  CBC  Result Value Ref Range   WBC 6.2 4.0 - 10.5 K/uL   RBC 5.03 3.87 - 5.11 Mil/uL   Platelets 169.0 150.0 - 400.0 K/uL   Hemoglobin 14.2 12.0 - 15.0 g/dL   HCT 43.7 36.0 - 46.0 %   MCV 86.8 78.0 - 100.0 fl   MCHC 32.5 30.0 - 36.0 g/dL   RDW 15.5 11.5 - 15.5 %  Comprehensive metabolic panel  Result Value Ref Range   Sodium 141 135 - 145 mEq/L   Potassium 3.8 3.5 - 5.1 mEq/L   Chloride 103 96 - 112 mEq/L   CO2 28 19 - 32 mEq/L   Glucose, Bld 104 (H) 70 - 99 mg/dL   BUN 14 6 - 23 mg/dL   Creatinine, Ser 0.83 0.40 - 1.20 mg/dL   Total Bilirubin 0.7 0.2 - 1.2 mg/dL   Alkaline Phosphatase 94 39 - 117 U/L   AST 22 0 - 37 U/L   ALT 16 0 - 35 U/L   Total Protein 7.3 6.0 - 8.3 g/dL   Albumin 4.1 3.5 - 5.2 g/dL   GFR 77.97 >60.00 mL/min   Calcium 9.9 8.4 - 10.5 mg/dL  Sedimentation rate  Result Value Ref Range   Sed Rate 49 (H) 0 - 30 mm/hr  POCT Influenza A/B  Result Value Ref Range   Influenza A, POC Negative Negative   Influenza B, POC Negative Negative  POCT rapid strep A  Result Value Ref Range   Rapid Strep A Screen Negative Negative

## 2021-03-23 NOTE — Progress Notes (Deleted)
[facility] [address] [place], [place] [address] [phone]  Date:  03/23/2021   Name:  Joanne Kim   DOB:  1961/07/23   MRN:  [id number]  PCP:  [name], MD    Chief Complaint: No chief complaint on file.   History of Present Illness:  Joanne Kim is a 60 y.o. 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   Dry eyes 10/23/2016   Obesity 10/23/2016   RECTAL BLEEDING 12/14/2009    Past Medical History:  Diagnosis Date   Allergy    Elevated blood pressure reading    History of colon polyps    Vaginal Pap smear, abnormal 2017    Past Surgical History:  Procedure Laterality Date   BREAST BIOPSY     BUNIONECTOMY     COLONOSCOPY  2011    Social History   Tobacco Use   Smoking status: Never   Smokeless tobacco: Never  Substance Use Topics   Alcohol use: Yes    Comment: occasionally   Drug use: No    Family History  Problem Relation Age of Onset   Heart disease Other    Stroke Other    Hypertension Other    Breast cancer Maternal Aunt    Sarcoidosis Sister    Allergic rhinitis Sister    Asthma Son    Allergic rhinitis Son    Urticaria Son    Colon polyps Mother    Allergic rhinitis Grandchild    Eczema Grandchild    Urticaria Niece    Colon cancer Neg Hx    Esophageal cancer Neg Hx    Rectal cancer Neg Hx    Stomach cancer Neg Hx    Angioedema Neg Hx    Immunodeficiency Neg Hx     Allergies  Allergen Reactions   Oxycodone Itching    Medication list has been reviewed and updated.  Current Outpatient Medications on File Prior to Visit  Medication Sig Dispense Refill   amoxicillin (AMOXIL) 500 MG  tablet Take 1 tablet (500 mg total) by mouth 2 (two) times daily. 20 tablet 0   Azelastine HCl 137 MCG/SPRAY SOLN PLACE 2 SPRAYS INTO BOTH NOSTRILS 2 TIMES DAILY AS NEEDED 30 mL 9   cholecalciferol (VITAMIN D3) 25 MCG (1000 UNIT) tablet Take 1,000 Units by mouth daily.     cyclobenzaprine (FLEXERIL) 10 MG tablet TAKE 1/2-1 TABLET (5-10 MG TOTAL) BY MOUTH 2 (TWO) TIMES DAILY AS NEEDED FOR MUSCLE SPASMS. 20 tablet 0   doxycycline (VIBRA-TABS) 100 MG tablet Take 1 tablet (100 mg total) by mouth 2 (two) times daily. 14 tablet 0   fluticasone (FLONASE) 50 MCG/ACT nasal spray PLACE 2 SPRAYS INTO BOTH NOSTRILS DAILY 16 g 9   Fluticasone Propionate (XHANCE) 93 MCG/ACT EXHU Place 2 sprays into both nostrils in the morning and at bedtime. 16 mL 11   levocetirizine (XYZAL) 5 MG tablet TAKE 1 TABLET BY MOUTH EVERY EVENING 90 tablet 3   MAGNESIUM CITRATE PO Take by mouth daily. 165 mg daily     montelukast (SINGULAIR) 10 MG tablet TAKE 1 TABLET BY MOUTH EVERY NIGHT AT BEDTIME AS NEEDED FOR ALLERGIES 90 tablet 3  No current facility-administered medications on file prior to visit.    Review of Systems:  As per HPI- otherwise negative.   Physical Examination: There were no vitals filed for this visit. There were no vitals filed for this visit. There is no height or weight on file to calculate BMI. Ideal Body Weight:    GEN: no acute distress. HEENT: Atraumatic, Normocephalic.  Ears and Nose: No external deformity. CV: RRR, No M/G/R. No JVD. No thrill. No extra heart sounds. PULM: CTA B, no wheezes, crackles, rhonchi. No retractions. No resp. distress. No accessory muscle use. ABD: S, NT, ND, +BS. No rebound. No HSM. EXTR: No c/c/e PSYCH: Normally interactive. Conversant.    Assessment and Plan:  This visit occurred during the SARS-CoV-2 public health emergency.  Safety protocols were in place, including screening questions prior to the visit, additional usage of staff PPE, and extensive  cleaning of exam room while observing appropriate contact time as indicated for disinfecting solutions.   Signed Lamar Blinks, MD

## 2021-03-24 ENCOUNTER — Encounter: Payer: Self-pay | Admitting: Family Medicine

## 2021-03-24 ENCOUNTER — Other Ambulatory Visit: Payer: Self-pay

## 2021-03-24 ENCOUNTER — Ambulatory Visit (HOSPITAL_BASED_OUTPATIENT_CLINIC_OR_DEPARTMENT_OTHER)
Admission: RE | Admit: 2021-03-24 | Discharge: 2021-03-24 | Disposition: A | Discharge status: Home / Self Care | Payer: 59 | Source: Ambulatory Visit | Attending: Family Medicine | Admitting: Family Medicine

## 2021-03-24 ENCOUNTER — Ambulatory Visit (INDEPENDENT_AMBULATORY_CARE_PROVIDER_SITE_OTHER): Payer: 59 | Admitting: Family Medicine

## 2021-03-24 VITALS — BP 112/72 | HR 76 | Temp 97.2°F | Resp 18 | Ht 69.0 in | Wt 230.0 lb

## 2021-03-24 DIAGNOSIS — R509 Fever, unspecified: Secondary | ICD-10-CM | POA: Insufficient documentation

## 2021-03-24 LAB — CBC
HCT: 43.7 % (ref 36.0–46.0)
Hemoglobin: 14.2 g/dL (ref 12.0–15.0)
MCHC: 32.5 g/dL (ref 30.0–36.0)
MCV: 86.8 fl (ref 78.0–100.0)
Platelets: 169 10*3/uL (ref 150.0–400.0)
RBC: 5.03 Mil/uL (ref 3.87–5.11)
RDW: 15.5 % (ref 11.5–15.5)
WBC: 6.2 10*3/uL (ref 4.0–10.5)

## 2021-03-24 LAB — COMPREHENSIVE METABOLIC PANEL
ALT: 16 U/L (ref 0–35)
AST: 22 U/L (ref 0–37)
Albumin: 4.1 g/dL (ref 3.5–5.2)
Alkaline Phosphatase: 94 U/L (ref 39–117)
BUN: 14 mg/dL (ref 6–23)
CO2: 28 mEq/L (ref 19–32)
Calcium: 9.9 mg/dL (ref 8.4–10.5)
Chloride: 103 mEq/L (ref 96–112)
Creatinine, Ser: 0.83 mg/dL (ref 0.40–1.20)
GFR: 77.97 mL/min (ref >60.00)
Glucose, Bld: 104 mg/dL — ABNORMAL HIGH (ref 70–99)
Potassium: 3.8 mEq/L (ref 3.5–5.1)
Sodium: 141 mEq/L (ref 135–145)
Total Bilirubin: 0.7 mg/dL (ref 0.2–1.2)
Total Protein: 7.3 g/dL (ref 6.0–8.3)

## 2021-03-24 LAB — POCT RAPID STREP A (OFFICE): Rapid Strep A Screen: NEGATIVE

## 2021-03-24 LAB — POCT INFLUENZA A/B
Influenza A, POC: NEGATIVE
Influenza B, POC: NEGATIVE

## 2021-03-24 LAB — SEDIMENTATION RATE: Sed Rate: 49 mm/hr — ABNORMAL HIGH (ref 0–30)

## 2021-03-24 NOTE — Patient Instructions (Addendum)
Good to see you today  I will be in touch with your labs- blood work and covid PCR test- and also your chest x-ray report If you start to get worse please contact me Elizebeth Koller out the doxycycline

## 2021-03-26 LAB — SARS-COV-2, NAA 2 DAY TAT

## 2021-03-26 LAB — NOVEL CORONAVIRUS, NAA: SARS-CoV-2, NAA: NOT DETECTED

## 2021-03-27 ENCOUNTER — Encounter: Payer: Self-pay | Admitting: Family Medicine

## 2021-04-05 ENCOUNTER — Encounter: Payer: Self-pay | Admitting: Family Medicine

## 2021-04-18 ENCOUNTER — Inpatient Hospital Stay (HOSPITAL_BASED_OUTPATIENT_CLINIC_OR_DEPARTMENT_OTHER): Admission: RE | Admit: 2021-04-18 | Payer: 59 | Source: Ambulatory Visit

## 2021-05-16 ENCOUNTER — Ambulatory Visit (HOSPITAL_BASED_OUTPATIENT_CLINIC_OR_DEPARTMENT_OTHER): Payer: 59

## 2021-05-18 ENCOUNTER — Telehealth: Payer: Self-pay | Admitting: Allergy & Immunology

## 2021-05-18 NOTE — Telephone Encounter (Signed)
[facility] called checking on PA for Xhance. States it was sent over on 8/6 from covermymeds with the key: [id number].

## 2021-05-18 NOTE — Telephone Encounter (Signed)
PA has been submitted through CoverMyMeds and is currently pending approval/denial.  

## 2021-05-23 NOTE — Telephone Encounter (Signed)
PA was denied for [facility]. Since the patient has Pharmacist, community it should be $50.00 a month copay.

## 2021-06-20 ENCOUNTER — Ambulatory Visit (HOSPITAL_BASED_OUTPATIENT_CLINIC_OR_DEPARTMENT_OTHER): Payer: 59

## 2021-06-28 NOTE — Progress Notes (Addendum)
[facility] [address] [place], [place] [address] [phone]  Date:  06/30/2021   Name:  Joanne Kim   DOB:  1961-04-04   MRN:  [id number]  PCP:  [name], MD    Chief Complaint: Fatigue (Pt says she does not feel bad, she just does not have any energy. She started taking B12 OTC and has not helped much. )   History of Present Illness:  Joanne Kim is a 60 y.o. 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   Dry eyes 10/23/2016   Obesity 10/23/2016   RECTAL BLEEDING 12/14/2009    Past Medical History:  Diagnosis Date   Allergy    Elevated blood pressure reading    History of colon polyps     Vaginal Pap smear, abnormal 2017    Past Surgical History:  Procedure Laterality Date   BREAST BIOPSY     BUNIONECTOMY     COLONOSCOPY  2011    Social History   Tobacco Use   Smoking status: Never   Smokeless tobacco: Never  Substance Use Topics   Alcohol use: Yes    Comment: occasionally   Drug use: No    Family History  Problem Relation Age of Onset   Heart disease Other    Stroke Other    Hypertension Other    Breast cancer Maternal Aunt    Sarcoidosis Sister    Allergic rhinitis Sister    Asthma Son    Allergic rhinitis Son    Urticaria Son    Colon polyps Mother    Allergic rhinitis Grandchild    Eczema Grandchild    Urticaria Niece    Colon cancer Neg Hx    Esophageal cancer Neg Hx    Rectal cancer Neg Hx    Stomach cancer Neg Hx    Angioedema Neg Hx    Immunodeficiency Neg Hx     Allergies  Allergen Reactions   Oxycodone Itching    Medication list has been reviewed  and updated.  Current Outpatient Medications on File Prior to Visit  Medication Sig Dispense Refill   Fluticasone Propionate (XHANCE) 93 MCG/ACT EXHU Place 2 sprays into both nostrils in the morning and at bedtime. 16 mL 11   MAGNESIUM CITRATE PO Take by mouth daily. 165 mg daily     montelukast (SINGULAIR) 10 MG tablet Take 10 mg by mouth at bedtime.     No current facility-administered medications on file prior to visit.    Review of Systems:  As per HPI- otherwise negative.   Physical Examination: Vitals:   06/30/21 1544  BP: 124/80  Pulse: 76  Resp: 18  Temp: 98.5 F (36.9 C)  SpO2: 95%   Vitals:   06/30/21 1544  Weight: 237 lb 3.2 oz (107.6 kg)  Height: 5\' 9"  (1.753 m)   Body mass index is 35.03 kg/m. Ideal Body Weight: Weight in (lb) to have BMI = 25: 168.9  GEN: no acute distress. Obese, looks well  HEENT: Atraumatic, Normocephalic.  Ears and Nose: No external deformity. CV: RRR, No M/G/R. No JVD. No thrill. No extra heart sounds. PULM: CTA B, no  wheezes, crackles, rhonchi. No retractions. No resp. distress. No accessory muscle use. ABD: S, NT, ND, +BS. No rebound. No HSM. EXTR: No c/c/e PSYCH: Normally interactive. Conversant.    Assessment and Plan: Fatigue, unspecified type - Plan: VITAMIN D 25 Hydroxy (Vit-D Deficiency, Fractures), Vitamin B12, TSH, Sedimentation rate  Screening for diabetes mellitus - Plan: Hemoglobin A1c  Need for influenza vaccination - Plan: Flu Vaccine QUAD 6+ mos PF IM (Fluarix Quad PF)  Patient seen today with nonspecific fatigue.  This may simply be due to sedentary lifestyle and long work hours.  She has been noted to have elevated sedimentation rate in the past.  Lab work is pending as above.  If this is noncontributory plan to refer for sleep study Encourage exercise Flu vaccine given Encourage mammogram  This visit occurred during the SARS-CoV-2 public health emergency.  Safety protocols were in place, including screening questions prior to the visit, additional usage of staff PPE, and extensive cleaning of exam room while observing appropriate contact time as indicated for disinfecting solutions.   Signed Lamar [facility]inks, MD  Addendum 9/23, received her labs as below.  Message to patient  Results for orders placed or performed in visit on 06/30/21  VITAMIN D 25 Hydroxy (Vit-D Deficiency, Fractures)  Result Value Ref Range   VITD 34.40 30.00 - 100.00 ng/mL  Vitamin B12  Result Value Ref Range   Vitamin B-12 560 211 - 911 pg/mL  TSH  Result Value Ref Range   TSH 1.67 0.35 - 5.50 uIU/mL  Sedimentation rate  Result Value Ref Range   Sed Rate 27 0 - 30 mm/hr  Hemoglobin A1c  Result Value Ref Range   Hgb A1c MFr Bld 5.7 4.6 - 6.5 %

## 2021-06-30 ENCOUNTER — Other Ambulatory Visit: Payer: Self-pay

## 2021-06-30 ENCOUNTER — Ambulatory Visit (INDEPENDENT_AMBULATORY_CARE_PROVIDER_SITE_OTHER): Payer: 59 | Admitting: Family Medicine

## 2021-06-30 ENCOUNTER — Encounter: Payer: Self-pay | Admitting: Family Medicine

## 2021-06-30 VITALS — BP 124/80 | HR 76 | Temp 98.5°F | Resp 18 | Ht 69.0 in | Wt 237.2 lb

## 2021-06-30 DIAGNOSIS — R5383 Other fatigue: Secondary | ICD-10-CM | POA: Diagnosis not present

## 2021-06-30 DIAGNOSIS — Z23 Encounter for immunization: Secondary | ICD-10-CM

## 2021-06-30 DIAGNOSIS — Z131 Encounter for screening for diabetes mellitus: Secondary | ICD-10-CM | POA: Diagnosis not present

## 2021-06-30 NOTE — Patient Instructions (Signed)
Good to see you today- I will be in touch with your labs asap If your labs are all ok I would suggest getting a sleep study as your next step

## 2021-07-01 ENCOUNTER — Encounter: Payer: Self-pay | Admitting: Family Medicine

## 2021-07-01 LAB — SEDIMENTATION RATE: Sed Rate: 27 mm/hr (ref 0–30)

## 2021-07-01 LAB — VITAMIN D 25 HYDROXY (VIT D DEFICIENCY, FRACTURES): VITD: 34.4 ng/mL (ref 30.00–100.00)

## 2021-07-01 LAB — HEMOGLOBIN A1C: Hgb A1c MFr Bld: 5.7 % (ref 4.6–6.5)

## 2021-07-01 LAB — TSH: TSH: 1.67 u[IU]/mL (ref 0.35–5.50)

## 2021-07-01 LAB — VITAMIN B12: Vitamin B-12: 560 pg/mL (ref 211–911)

## 2021-07-01 NOTE — Addendum Note (Signed)
Addended by: Lamar Blinks C on: 07/01/2021 12:30 PM   Modules accepted: Orders

## 2021-07-03 ENCOUNTER — Encounter: Payer: Self-pay | Admitting: Family Medicine

## 2021-07-05 ENCOUNTER — Telehealth: Payer: 59 | Admitting: Nurse Practitioner

## 2021-07-05 DIAGNOSIS — J069 Acute upper respiratory infection, unspecified: Secondary | ICD-10-CM

## 2021-07-05 NOTE — Progress Notes (Signed)
E-Visit for Upper Respiratory Infection   We are sorry you are not feeling well.  Here is how we plan to help!  Based on what you have shared with me, it looks like you may have a viral upper respiratory infection.  Upper respiratory infections are caused by a large number of viruses; however, rhinovirus is the most common cause.   After receiving a vaccine your immune system may be slightly weaker as you are building up immunity to what you were vaccinated for.   We are glad you were able to take a COVID test and that you do not have COVID.   Our best recommendation is to rest, assure hydration and to treat your symptoms as you have been.   Symptoms vary from person to person, with common symptoms including sore throat, cough, fatigue or lack of energy and feeling of general discomfort.  A low-grade fever of up to 100.4 may present, but is often uncommon.  Symptoms vary however, and are closely related to a person's age or underlying illnesses.  The most common symptoms associated with an upper respiratory infection are nasal discharge or congestion, cough, sneezing, headache and pressure in the ears and face.  These symptoms usually persist for about 3 to 10 days, but can last up to 2 weeks.  It is important to know that upper respiratory infections do not cause serious illness or complications in most cases.    Upper respiratory infections can be transmitted from person to person, with the most common method of transmission being a person's hands.  The virus is able to live on the skin and can infect other persons for up to 2 hours after direct contact.  Also, these can be transmitted when someone coughs or sneezes; thus, it is important to cover the mouth to reduce this risk.  To keep the spread of the illness at [facility], good hand hygiene is very important.  This is an infection that is most likely caused by a virus. There are no specific treatments other than to help you with the symptoms until the  infection runs its course.  We are sorry you are not feeling well.  Here is how we plan to help!   For nasal congestion, you may use an oral decongestants such as Mucinex D or if you have glaucoma or high blood pressure use plain Mucinex.  Saline nasal spray or nasal drops can help and can safely be used as often as needed for congestion.    If you do not have a history of heart disease, hypertension, diabetes or thyroid disease, prostate/bladder issues or glaucoma, you may also use Sudafed to treat nasal congestion.  It is highly recommended that you consult with a pharmacist or your primary care physician to ensure this medication is safe for you to take.     If you have a cough, you may use cough suppressants such as Delsym and Robitussin.  If you have glaucoma or high blood pressure, you can also use Coricidin HBP.     If you have a sore or scratchy throat, use a saltwater gargle-  to  teaspoon of salt dissolved in a 4-ounce to 8-ounce glass of warm water.  Gargle the solution for approximately 15-30 seconds and then spit.  It is important not to swallow the solution.  You can also use throat lozenges/cough drops and Chloraseptic spray to help with throat pain or discomfort.  Warm or cold liquids can also be helpful in relieving throat  pain.  For headache, pain or general discomfort, you can use Ibuprofen or Tylenol as directed.   Some authorities believe that zinc sprays or the use of Echinacea may shorten the course of your symptoms.   HOME CARE Only take medications as instructed by your medical team. Be sure to drink plenty of fluids. Water is fine as well as fruit juices, sodas and electrolyte beverages. You may want to stay away from caffeine or alcohol. If you are nauseated, try taking small sips of liquids. How do you know if you are getting enough fluid? Your urine should be a pale yellow or almost colorless. Get rest. Taking a steamy shower or using a humidifier may help nasal  congestion and ease sore throat pain. You can place a towel over your head and breathe in the steam from hot water coming from a faucet. Using a saline nasal spray works much the same way. Cough drops, hard candies and sore throat lozenges may ease your cough. Avoid close contacts especially the very young and the elderly Cover your mouth if you cough or sneeze Always remember to wash your hands.   GET HELP RIGHT AWAY IF: You develop worsening fever. If your symptoms do not improve within 10 days You develop yellow or green discharge from your nose over 3 days. You have coughing fits You develop a severe head ache or visual changes. You develop shortness of breath, difficulty breathing or start having chest pain Your symptoms persist after you have completed your treatment plan  MAKE SURE YOU  Understand these instructions. Will watch your condition. Will get help right away if you are not doing well or get worse.  Thank you for choosing an e-visit.  Your e-visit answers were reviewed by a board certified advanced clinical practitioner to complete your personal care plan. Depending upon the condition, your plan could have included both over the counter or prescription medications.  Please review your pharmacy choice. Make sure the pharmacy is open so you can pick up prescription now. If there is a problem, you may contact your provider through [employer] and have the prescription routed to another pharmacy.  Your safety is important to [place]. If you have drug allergies check your prescription carefully.   For the next 24 hours you can use MyChart to ask questions about today's visit, request a non-urgent call back, or ask for a work or school excuse. You will get an email in the next two days asking about your experience. I hope that your e-visit has been valuable and will speed your recovery.  I spent approximately 7 minutes reviewing the patient's history, current symptoms and  coordinating their plan of care today.

## 2021-07-07 ENCOUNTER — Telehealth: Payer: 59 | Admitting: Nurse Practitioner

## 2021-07-07 DIAGNOSIS — J069 Acute upper respiratory infection, unspecified: Secondary | ICD-10-CM | POA: Diagnosis not present

## 2021-07-07 MED ORDER — BENZONATATE 100 MG PO CAPS: 100.0000 mg | ORAL_CAPSULE | Freq: Two times a day (BID) | ORAL | 0 refills | Status: DC | PRN

## 2021-07-07 MED ORDER — GUAIFENESIN-DM 100-10 MG/5ML PO SYRP: 5.0000 mL | ORAL_SOLUTION | ORAL | 0 refills | Status: DC | PRN

## 2021-07-07 NOTE — Progress Notes (Signed)
Virtual Visit Consent   Joanne Kim, you are scheduled for a virtual visit with a [facility] provider today.     Just as with appointments in the office, your consent must be obtained to participate.  Your consent will be active for this visit and any virtual visit you may have with one of our providers in the next 365 days.     If you have a MyChart account, a copy of this consent can be sent to you electronically.  All virtual visits are billed to your insurance company just like a traditional visit in the office.    As this is a virtual visit, video technology does not allow for your provider to perform a traditional examination.  This may limit your provider's ability to fully assess your condition.  If your provider identifies any concerns that need to be evaluated in person or the need to arrange testing (such as labs, EKG, etc.), we will make arrangements to do so.     Although advances in technology are sophisticated, we cannot ensure that it will always work on either your end or our end.  If the connection with a video visit is poor, the visit may have to be switched to a telephone visit.  With either a video or telephone visit, we are not always able to ensure that we have a secure connection.     I need to obtain your verbal consent now.   Are you willing to proceed with your visit today?    Joanne Kim has provided verbal consent on 07/07/2021 for a virtual visit (video or telephone).   Gildardo Pounds, NP   Date: 07/07/2021 2:25 PM   Virtual Visit via Video Note   I, Gildardo Pounds, connected with  Joanne Kim  ([id number], 1961/03/13) on 07/07/21 at  2:15 PM EDT by a video-enabled telemedicine application and verified that I am speaking with the correct person using two identifiers.  Location: Patient: Virtual Visit Location Patient: Home Provider: Virtual Visit Location Provider: Home Office   I discussed the limitations of evaluation and  management by telemedicine and the availability of in person appointments. The patient expressed understanding and agreed to proceed.    History of Present Illness: Joanne Kim is a 60 y.o. who identifies as a female who was assigned female at birth, and is being seen today for upper respiratory symptoms.   HPI:  She received the flu vaccine on 9-22 from her PCP and since then has been feeling "terrible". She is not vaccinated with the COVID vaccine and had not had a flu vaccine ever before the 22nd.  Symptoms: fatigue, Sneezing, Coughing, Scratchy throat Hoarseness, Chest pressure and head pressure Body Aches  She is currently taking mucinex-d OTC and gargling with salt water   She will require a work note for today and tomorrow.     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   Dry eyes 10/23/2016   Obesity 10/23/2016   RECTAL BLEEDING 12/14/2009    Allergies:  Allergies  Allergen Reactions   Oxycodone Itching   Medications:  Current Outpatient Medications:    benzonatate (TESSALON) 100 MG capsule, Take 1 capsule (100 mg total) by mouth 2 (two) times daily as needed for cough., Disp: 20 capsule, Rfl: 0   guaiFENesin-dextromethorphan (ROBITUSSIN DM) 100-10 MG/5ML syrup, Take 5 mLs by mouth every 4 (four) hours as needed for cough.,  Disp: 118 mL, Rfl: 0   Fluticasone Propionate (XHANCE) 93 MCG/ACT EXHU, Place 2 sprays into both nostrils in the morning and at bedtime., Disp: 16 mL, Rfl: 11   MAGNESIUM CITRATE PO, Take by mouth daily. 165 mg daily, Disp: , Rfl:    montelukast (SINGULAIR) 10 MG tablet, Take 10 mg by mouth at bedtime., Disp: , Rfl:   Observations/Objective: Patient is well-developed, well-nourished in no acute distress.  Resting comfortably  at home.  Head is normocephalic, atraumatic.  No labored breathing. Voice is very hoarse.  Speech is clear and coherent with logical content.   Patient is alert and oriented at baseline.    Assessment and Plan: 1. Viral upper respiratory tract infection - benzonatate (TESSALON) 100 MG capsule; Take 1 capsule (100 mg total) by mouth 2 (two) times daily as needed for cough.  Dispense: 20 capsule; Refill: 0 - guaiFENesin-dextromethorphan (ROBITUSSIN DM) 100-10 MG/5ML syrup; Take 5 mLs by mouth every 4 (four) hours as needed for cough.  Dispense: 118 mL; Refill: 0 INSTRUCTIONS: use a humidifier for nasal congestion Drink plenty of fluids, rest and wash hands frequently to avoid the spread of infection Alternate tylenol and Motrin for relief of fever    Follow Up Instructions: I discussed the assessment and treatment plan with the patient. The patient was provided an opportunity to ask questions and all were answered. The patient agreed with the plan and demonstrated an understanding of the instructions.  A copy of instructions were sent to the patient via MyChart unless otherwise noted below.   The patient was advised to call back or seek an in-person evaluation if the symptoms worsen or if the condition fails to improve as anticipated.  Time:  I spent 10 minutes with the patient via telehealth technology discussing the above problems/concerns.    Gildardo Pounds, NP

## 2021-07-07 NOTE — Patient Instructions (Signed)
  Joanne Kim, thank you for joining Gildardo Pounds, NP for today's virtual visit.  While this provider is not your primary care provider (PCP), if your PCP is located in our provider database this encounter information will be shared with them immediately following your visit.  Consent: (Patient) Joanne Kim provided verbal consent for this virtual visit at the beginning of the encounter.  Current Medications:  Current Outpatient Medications:    benzonatate (TESSALON) 100 MG capsule, Take 1 capsule (100 mg total) by mouth 2 (two) times daily as needed for cough., Disp: 20 capsule, Rfl: 0   guaiFENesin-dextromethorphan (ROBITUSSIN DM) 100-10 MG/5ML syrup, Take 5 mLs by mouth every 4 (four) hours as needed for cough., Disp: 118 mL, Rfl: 0   Fluticasone Propionate (XHANCE) 93 MCG/ACT EXHU, Place 2 sprays into both nostrils in the morning and at bedtime., Disp: 16 mL, Rfl: 11   MAGNESIUM CITRATE PO, Take by mouth daily. 165 mg daily, Disp: , Rfl:    montelukast (SINGULAIR) 10 MG tablet, Take 10 mg by mouth at bedtime., Disp: , Rfl:    Medications ordered in this encounter:  Meds ordered this encounter  Medications   benzonatate (TESSALON) 100 MG capsule    Sig: Take 1 capsule (100 mg total) by mouth 2 (two) times daily as needed for cough.    Dispense:  20 capsule    Refill:  0    Order Specific Question:   Supervising Provider    Answer:   MILLER, BRIAN [[id number]]   guaiFENesin-dextromethorphan (ROBITUSSIN DM) 100-10 MG/5ML syrup    Sig: Take 5 mLs by mouth every 4 (four) hours as needed for cough.    Dispense:  118 mL    Refill:  0    Order Specific Question:   Supervising Provider    Answer:   Sabra Heck, BRIAN [[id number]]     *If you need refills on other medications prior to your next appointment, please contact your pharmacy*  Follow-Up: Call back or seek an in-person evaluation if the symptoms worsen or if the condition fails to improve as anticipated.   If you have  been instructed to have an in-person evaluation today at a local Urgent Care facility, please use the link below. It will take you to a list of all of our available [facility], including address, phone number and hours of operation. Please do not delay care.  [facility]  If you or a family member do not have a primary care provider, use the link below to schedule a visit and establish care. When you choose a [facility] primary care physician or advanced practice provider, you gain a long-term partner in health. Find a Primary Care Provider  Learn more about [facility]'s in-office and virtual care options: [facility]

## 2021-07-15 ENCOUNTER — Telehealth: Payer: 59 | Admitting: Physician Assistant

## 2021-07-15 DIAGNOSIS — J028 Acute pharyngitis due to other specified organisms: Secondary | ICD-10-CM | POA: Diagnosis not present

## 2021-07-15 DIAGNOSIS — B9689 Other specified bacterial agents as the cause of diseases classified elsewhere: Secondary | ICD-10-CM | POA: Diagnosis not present

## 2021-07-15 MED ORDER — AMOXICILLIN 500 MG PO CAPS: 500.0000 mg | ORAL_CAPSULE | Freq: Two times a day (BID) | ORAL | 0 refills | Status: AC

## 2021-07-15 NOTE — Progress Notes (Signed)
E-Visit for Sore Throat - Strep Symptoms  We are sorry that you are not feeling well.  Here is how we plan to help!  Based on what you have shared with me it is likely that you have bacterial pharyngitis.  Bacterial pharyngitis is inflammation and infection in the back of the throat.  This is an infection cause by bacteria and is treated with antibiotics.  I have prescribed Amoxicillin 500 mg twice a day for 10 days. For throat pain, we recommend over the counter oral pain relief medications such as acetaminophen or aspirin, or anti-inflammatory medications such as ibuprofen or naproxen sodium. Topical treatments such as oral throat lozenges or sprays may be used as needed. Bacterial infections are not as easily transmitted as other respiratory infections, however we still recommend that you avoid close contact with loved ones, especially the very young and elderly.  Remember to wash your hands thoroughly throughout the day as this is the number one way to prevent the spread of infection and wipe down door knobs and counters with disinfectant.   Home Care: Only take medications as instructed by your medical team. Complete the entire course of an antibiotic. Do not take these medications with alcohol. A steam or ultrasonic humidifier can help congestion.  You can place a towel over your head and breathe in the steam from hot water coming from a faucet. Avoid close contacts especially the very young and the elderly. Cover your mouth when you cough or sneeze. Always remember to wash your hands.  Get Help Right Away If: You develop worsening fever or sinus pain. You develop a severe head ache or visual changes. Your symptoms persist after you have completed your treatment plan.  Make sure you Understand these instructions. Will watch your condition. Will get help right away if you are not doing well or get worse.   Thank you for choosing an e-visit.  Your e-visit answers were reviewed by a  board certified advanced clinical practitioner to complete your personal care plan. Depending upon the condition, your plan could have included both over the counter or prescription medications.  Please review your pharmacy choice. Make sure the pharmacy is open so you can pick up prescription now. If there is a problem, you may contact your provider through [employer] and have the prescription routed to another pharmacy.  Your safety is important to [place]. If you have drug allergies check your prescription carefully.   For the next 24 hours you can use MyChart to ask questions about today's visit, request a non-urgent call back, or ask for a work or school excuse. You will get an email in the next two days asking about your experience. I hope that your e-visit has been valuable and will speed your recovery.  I provided 6 minutes of non face-to-face time during this encounter for chart review and documentation.

## 2021-07-19 NOTE — Progress Notes (Deleted)
[facility] [address] [place], [place] [address] [phone]  Date:  07/20/2021   Name:  Joanne Kim   DOB:  1961-01-02   MRN:  [id number]  PCP:  [name], MD    Chief Complaint: No chief complaint on file.   History of Present Illness:  Joanne Kim is a 60 y.o. very pleasant female patient who presents with the following:  Patient with history of obesity and glaucoma, here today with concern of sore throat/upper respiratory symptoms and proteinuria.  Last seen by myself in September with generalized fatigue. 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   Dry eyes 10/23/2016   Obesity 10/23/2016   RECTAL BLEEDING 12/14/2009    Past Medical History:  Diagnosis Date   Allergy    Elevated blood pressure reading    History of colon polyps    Vaginal Pap smear, abnormal 2017    Past Surgical History:  Procedure Laterality Date   BREAST BIOPSY     BUNIONECTOMY     COLONOSCOPY  2011    Social History   Tobacco Use   Smoking status: Never   Smokeless tobacco: Never  Substance Use Topics   Alcohol use: Yes    Comment: occasionally   Drug use: No    Family History  Problem Relation Age of Onset   Heart disease Other    Stroke Other    Hypertension Other    Breast cancer Maternal Aunt    Sarcoidosis Sister    Allergic rhinitis Sister    Asthma Son    Allergic rhinitis Son    Urticaria Son    Colon polyps Mother    Allergic rhinitis Grandchild    Eczema Grandchild    Urticaria Niece     Colon cancer Neg Hx    Esophageal cancer Neg Hx    Rectal cancer Neg Hx    Stomach cancer Neg Hx    Angioedema Neg Hx    Immunodeficiency Neg Hx     Allergies  Allergen Reactions   Oxycodone Itching    Medication list has been reviewed and updated.  Current Outpatient Medications on File Prior to Visit  Medication Sig Dispense Refill   amoxicillin (AMOXIL) 500 MG capsule Take 1 capsule (500 mg total) by mouth 2 (two) times daily for 10 days. 20 capsule 0   benzonatate (TESSALON) 100 MG capsule Take 1 capsule (100 mg total) by mouth 2 (two) times daily as needed for cough. 20 capsule 0   Fluticasone Propionate (XHANCE) 93 MCG/ACT EXHU Place 2 sprays into both nostrils in the morning and at bedtime. 16 mL 11   guaiFENesin-dextromethorphan (ROBITUSSIN DM) 100-10 MG/5ML syrup Take 5 mLs by mouth every 4 (four) hours as needed for cough. 118 mL 0   MAGNESIUM CITRATE PO Take by mouth daily. 165 mg daily     montelukast (SINGULAIR) 10 MG tablet Take 10 mg by mouth at bedtime.     No current facility-administered medications on file prior  to visit.    Review of Systems:  As per HPI- otherwise negative.   Physical Examination: There were no vitals filed for this visit. There were no vitals filed for this visit. There is no height or weight on file to calculate BMI. Ideal Body Weight:    GEN: no acute distress. HEENT: Atraumatic, Normocephalic.  Ears and Nose: No external deformity. CV: RRR, No M/G/R. No JVD. No thrill. No extra heart sounds. PULM: CTA B, no wheezes, crackles, rhonchi. No retractions. No resp. distress. No accessory muscle use. ABD: S, NT, ND, +BS. No rebound. No HSM. EXTR: No c/c/e PSYCH: Normally interactive. Conversant.    Assessment and Plan: ***  Signed Lamar Blinks, MD

## 2021-07-20 ENCOUNTER — Ambulatory Visit: Payer: 59 | Admitting: Family Medicine

## 2021-07-25 ENCOUNTER — Ambulatory Visit (HOSPITAL_BASED_OUTPATIENT_CLINIC_OR_DEPARTMENT_OTHER): Payer: 59

## 2021-07-26 ENCOUNTER — Telehealth: Payer: 59 | Admitting: Physician Assistant

## 2021-07-26 DIAGNOSIS — J019 Acute sinusitis, unspecified: Secondary | ICD-10-CM

## 2021-07-26 MED ORDER — XHANCE 93 MCG/ACT NA EXHU: 2.0000 | INHALANT_SUSPENSION | Freq: Two times a day (BID) | NASAL | 11 refills | Status: DC

## 2021-07-26 MED ORDER — DOXYCYCLINE HYCLATE 100 MG PO CAPS: 100.0000 mg | ORAL_CAPSULE | Freq: Two times a day (BID) | ORAL | 0 refills | Status: DC

## 2021-07-26 NOTE — Progress Notes (Signed)
Ms. Joanne Kim are scheduled for a virtual visit with your provider today.    Just as we do with appointments in the office, we must obtain your consent to participate.  Your consent will be active for this visit and any virtual visit you may have with one of our providers in the next 365 days.    If you have a MyChart account, I can also send a copy of this consent to you electronically.  All virtual visits are billed to your insurance company just like a traditional visit in the office.  As this is a virtual visit, video technology does not allow for your provider to perform a traditional examination.  This may limit your provider's ability to fully assess your condition.  If your provider identifies any concerns that need to be evaluated in person or the need to arrange testing such as labs, EKG, etc, we will make arrangements to do so.    Although advances in technology are sophisticated, we cannot ensure that it will always work on either your end or our end.  If the connection with a video visit is poor, we may have to switch to a telephone visit.  With either a video or telephone visit, we are not always able to ensure that we have a secure connection.   I need to obtain your verbal consent now.   Are you willing to proceed with your visit today?   Joanne Kim has provided verbal consent on 07/26/2021 for a virtual visit (video or telephone).   Joanne Soho Bula Cavalieri, PA-C 07/26/2021  1:13 PM   Date:  07/26/2021   ID:  Joanne Kim, DOB [birth year], MRN [id number]  Patient Location: Home Provider Location: Home Office   Participants: Patient and Provider for Visit and Wrap up  Method of visit: Video  Location of Patient: Home Location of Provider: Home Office Consent was obtain for visit over the video. Services rendered by provider: Visit was performed via video  A video enabled telemedicine application was used and I verified that I am speaking with the correct  person using two identifiers.  PCP:  Copland, Gay Filler, MD   Chief Complaint:  URI symptoms  History of Present Illness:    Joanne Kim is a 60 y.o. female with history as stated below. Presents video telehealth for an acute care visit.  Pt reports she was sick 2 weeks ago and dx of upper respiratory infection.  She felt well last week, but developed a sore throat this week.    She reports she was diagnosed with bacterial pharyngitis and given amoxicillin - finishing today.  Reports yesterday and this weekend she felt well, but awoke today feeling unwell.  Pt reports today she also has frontal headache, upper dental pain, dry and painful nose, laryngitis, cough, body aches  Pt reports she has continued to go to work and push herself.   Modifying factors include: Mucinex DM with some relief. Consistently using Flonase.  Has used Xhance in the past with good results.   No other aggravating or relieving factors.  No other c/o.  The patient does have symptoms concerning for COVID-19 infection (fever, chills, cough, or new shortness of breath).  Patient has been tested for COVID during this illness - result: negative  Past Medical, Surgical, Social History, Allergies, and Medications have been Reviewed.  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   Dry eyes 10/23/2016   Obesity  10/23/2016   RECTAL BLEEDING 12/14/2009    Social History   Tobacco Use   Smoking status: Never   Smokeless tobacco: Never  Substance Use Topics   Alcohol use: Yes    Comment: occasionally     Current Outpatient Medications:    doxycycline (VIBRAMYCIN) 100 MG capsule, Take 1 capsule (100 mg total) by mouth 2 (two) times daily., Disp: 20 capsule, Rfl: 0   benzonatate (TESSALON) 100 MG capsule, Take 1 capsule (100 mg total) by mouth 2 (two) times daily as needed for cough., Disp: 20 capsule, Rfl: 0   Fluticasone Propionate (XHANCE)  93 MCG/ACT EXHU, Place 2 sprays into both nostrils in the morning and at bedtime., Disp: 16 mL, Rfl: 11   guaiFENesin-dextromethorphan (ROBITUSSIN DM) 100-10 MG/5ML syrup, Take 5 mLs by mouth every 4 (four) hours as needed for cough., Disp: 118 mL, Rfl: 0   MAGNESIUM CITRATE PO, Take by mouth daily. 165 mg daily, Disp: , Rfl:    montelukast (SINGULAIR) 10 MG tablet, Take 10 mg by mouth at bedtime., Disp: , Rfl:    Allergies  Allergen Reactions   Oxycodone Itching     Review of Systems  Constitutional:  Negative for chills and fever.  HENT:  Positive for congestion and sore throat. Negative for ear pain.   Eyes:  Negative for blurred vision and double vision.  Respiratory:  Positive for cough. Negative for shortness of breath and wheezing.   Cardiovascular:  Negative for chest pain, palpitations and leg swelling.  Gastrointestinal:  Negative for abdominal pain, diarrhea, nausea and vomiting.  Genitourinary:  Negative for dysuria.  Musculoskeletal:  Positive for myalgias.  Skin:  Negative for rash.  Neurological:  Negative for loss of consciousness, weakness and headaches.  Psychiatric/Behavioral:  The patient is not nervous/anxious.   See HPI for history of present illness.  Physical Exam Constitutional:      General: She is not in acute distress.    Appearance: Normal appearance. She is not ill-appearing.  HENT:     Head: Normocephalic and atraumatic.     Nose: Congestion present.  Eyes:     Extraocular Movements: Extraocular movements intact.  Neck:     Comments: Hoarse voice, but no hot potato voice Pulmonary:     Effort: Pulmonary effort is normal.  Musculoskeletal:        General: Normal range of motion.     Cervical back: Normal range of motion.  Skin:    Coloration: Skin is not pale.  Neurological:     General: No focal deficit present.     Mental Status: She is alert. Mental status is at baseline.  Psychiatric:        Mood and Affect: Mood normal.               A&P  Acute sinusitis, recurrence not specified, unspecified location  - doxycycline  - zyrtec daily  - refill of Xhance nasal spray  - PCP and allergist follow-up as needed   Patient voiced understanding and agreement to plan.   Time:   Today, I have spent 15 minutes with the patient with telehealth technology discussing the above problems, reviewing the chart, previous notes, medications and orders.    Tests Ordered: No orders of the defined types were placed in this encounter.   Medication Changes: Meds ordered this encounter  Medications   doxycycline (VIBRAMYCIN) 100 MG capsule    Sig: Take 1 capsule (100 mg total) by mouth 2 (two) times daily.  Dispense:  20 capsule    Refill:  0   Fluticasone Propionate (XHANCE) 93 MCG/ACT EXHU    Sig: Place 2 sprays into both nostrils in the morning and at bedtime.    Dispense:  16 mL    Refill:  11     Disposition:  Follow up PCP and allergist as needed  Signed, Abigail Butts, PA-C  07/26/2021 1:27 PM

## 2021-07-26 NOTE — Patient Instructions (Signed)
  Acute sinusitis, recurrence not specified, unspecified location  - doxycycline  - zyrtec daily  - refill of Xhance nasal spray  - PCP and allergist follow-up as needed

## 2021-08-16 ENCOUNTER — Ambulatory Visit (HOSPITAL_BASED_OUTPATIENT_CLINIC_OR_DEPARTMENT_OTHER)
Admission: RE | Admit: 2021-08-16 | Discharge: 2021-08-16 | Disposition: A | Discharge status: Home / Self Care | Payer: 59 | Source: Ambulatory Visit | Attending: Family Medicine | Admitting: Family Medicine

## 2021-08-16 ENCOUNTER — Encounter (HOSPITAL_BASED_OUTPATIENT_CLINIC_OR_DEPARTMENT_OTHER): Payer: Self-pay

## 2021-08-16 ENCOUNTER — Other Ambulatory Visit: Payer: Self-pay

## 2021-08-16 DIAGNOSIS — Z1231 Encounter for screening mammogram for malignant neoplasm of breast: Secondary | ICD-10-CM | POA: Diagnosis present

## 2021-09-05 ENCOUNTER — Ambulatory Visit (INDEPENDENT_AMBULATORY_CARE_PROVIDER_SITE_OTHER): Payer: 59 | Admitting: Neurology

## 2021-09-05 ENCOUNTER — Encounter: Payer: Self-pay | Admitting: Neurology

## 2021-09-05 ENCOUNTER — Other Ambulatory Visit: Payer: Self-pay

## 2021-09-05 VITALS — BP 134/86 | HR 70 | Ht 69.0 in | Wt 235.5 lb

## 2021-09-05 DIAGNOSIS — G478 Other sleep disorders: Secondary | ICD-10-CM | POA: Diagnosis not present

## 2021-09-05 DIAGNOSIS — G9331 Postviral fatigue syndrome: Secondary | ICD-10-CM

## 2021-09-05 DIAGNOSIS — R400 Somnolence: Secondary | ICD-10-CM | POA: Diagnosis not present

## 2021-09-05 NOTE — Patient Instructions (Signed)
Obesity, Adult Obesity is the condition of having too much total body fat. Being overweight or obese means that your weight is greater than what is considered healthy for your body size. Obesity is determined by a measurement called BMI (body mass index). BMI is an estimate of body fat and is calculated from height and weight. For adults, a BMI of 30 or higher is considered obese. Obesity can lead to other health concerns and major illnesses, including: Stroke. Coronary artery disease (CAD). Type 2 diabetes. Some types of cancer, including cancers of the colon, breast, uterus, and gallbladder. High blood pressure (hypertension). High cholesterol. Gallbladder stones. Obesity can also contribute to: Osteoarthritis. Sleep apnea. Infertility problems. What are the causes? Common causes of this condition include: Eating daily meals that are high in calories, sugar, and fat. Drinking high amounts of sugar-sweetened beverages, such as soft drinks. Being born with genes that may make you more likely to become obese. Having a medical condition that causes obesity, including: Hypothyroidism. Polycystic ovarian syndrome (PCOS). Binge-eating disorder. Cushing syndrome. Taking certain medicines, such as steroids, antidepressants, and seizure medicines. Not being physically active (sedentary lifestyle). Not getting enough sleep. What increases the risk? The following factors may make you more likely to develop this condition: Having a family history of obesity. Living in an area with limited access to: [place], recreation centers, or sidewalks. Healthy food choices, such as grocery stores and farmers' markets. What are the signs or symptoms? The main sign of this condition is having too much body fat. How is this diagnosed? This condition is diagnosed based on: Your BMI. If you are an adult with a BMI of 30 or higher, you are considered obese. Your waist circumference. This measures the  distance around your waistline. Your skinfold thickness. Your health care provider may gently pinch a fold of your skin and measure it. You may have other tests to check for underlying conditions. How is this treated? Treatment for this condition often includes changing your lifestyle. Treatment may include some or all of the following: Dietary changes. This may include developing a healthy meal plan. Regular physical activity. This may include activity that causes your heart to beat faster (aerobic exercise) and strength training. Work with your health care provider to design an exercise program that works for you. Medicine to help you lose weight if you are unable to lose one pound a week after six weeks of healthy eating and more physical activity. Treating conditions that cause the obesity (underlying conditions). Surgery. Surgical options may include gastric banding and gastric bypass. Surgery may be done if: Other treatments have not helped to improve your condition. You have a BMI of 40 or higher. You have life-threatening health problems related to obesity. Follow these instructions at home: Eating and drinking  Follow recommendations from your health care provider about what you eat and drink. Your health care provider may advise you to: Limit fast food, sweets, and processed snack foods. Choose low-fat options, such as low-fat milk instead of whole milk. Eat five or more servings of fruits or vegetables every day. Choose healthy foods when you eat out. Keep low-fat snacks available. Limit sugary drinks, such as soda, fruit juice, sweetened iced tea, and flavored milk. Drink enough water to keep your urine pale yellow. Do not follow a fad diet. Fad diets can be unhealthy and even dangerous. Other healthful choices include: Eat at home more often. This gives you more control over what you eat. Learn to read food labels.  This will help you understand how much food is considered one  serving. Learn what a healthy serving size is. Physical activity Exercise regularly, as told by your health care provider. Most adults should get up to 150 minutes of moderate-intensity exercise every week. Ask your health care provider what types of exercise are safe for you and how often you should exercise. Warm up and stretch before being active. Cool down and stretch after being active. Rest between periods of activity. Lifestyle Work with your health care provider and a dietitian to set a weight-loss goal that is healthy and reasonable for you. Limit your screen time. Find ways to reward yourself that do not involve food. Do not drink alcohol if: Your health care provider tells you not to drink. You are pregnant, may be pregnant, or are planning to become pregnant. If you drink alcohol: Limit how much you have to: 0-1 drink a day for women. 0-2 drinks a day for men. Know how much alcohol is in your drink. In the [place] one drink equals one 12 oz bottle of beer (355 mL), one 5 oz glass of wine (148 mL), or one 1 oz glass of hard liquor (44 mL). General instructions Keep a weight-loss journal to keep track of the food you eat and how much exercise you get. Take over-the-counter and prescription medicines only as told by your health care provider. Take vitamins and supplements only as told by your health care provider. Consider joining a support group. Your health care provider may be able to recommend a support group. Pay attention to your mental health as obesity can lead to depression or self esteem issues. Keep all follow-up visits. This is important. Contact a health care provider if: You are unable to meet your weight-loss goal after six weeks of dietary and lifestyle changes. You have trouble breathing. Summary Obesity is the condition of having too much total body fat. Being overweight or obese means that your weight is greater than what is considered healthy for your body  size. Work with your health care provider and a dietitian to set a weight-loss goal that is healthy and reasonable for you. Exercise regularly, as told by your health care provider. Ask your health care provider what types of exercise are safe for you and how often you should exercise. This information is not intended to replace advice given to you by your health care provider. Make sure you discuss any questions you have with your health care provider. Document Revised: 05/03/2021 Document Reviewed: 05/03/2021 Elsevier Patient Education  [employer]. Fatigue If you have fatigue, you feel tired all the time and have a lack of energy or a lack of motivation. Fatigue may make it difficult to start or complete tasks because of exhaustion. In general, occasional or mild fatigue is often a normal response to activity or life. However, long-lasting (chronic) or extreme fatigue may be a symptom of a medical condition. Follow these instructions at home: General instructions Watch your fatigue for any changes. Go to bed and get up at the same time every day. Avoid fatigue by pacing yourself during the day and getting enough sleep at night. Maintain a healthy weight. Medicines Take over-the-counter and prescription medicines only as told by your health care provider. Take a multivitamin, if told by your health care provider.  Do not use herbal or dietary supplements unless they are approved by your health care provider. Activity  Exercise regularly, as told by your health care provider. Use  or practice techniques to help you relax, such as yoga, tai chi, meditation, or massage therapy. Eating and drinking  Avoid heavy meals in the evening. Eat a well-balanced diet, which includes lean proteins, whole grains, plenty of fruits and vegetables, and low-fat dairy products. Avoid consuming too much caffeine. Avoid the use of alcohol. Drink enough fluid to keep your urine pale  yellow. Lifestyle Change situations that cause you stress. Try to keep your work and personal schedule in balance. Do not use any products that contain nicotine or tobacco, such as cigarettes and e-cigarettes. If you need help quitting, ask your health care provider. Do not use drugs. Contact a health care provider if: Your fatigue does not get better. You have a fever. You suddenly lose or gain weight. You have headaches. You have trouble falling asleep or sleeping through the night. You feel angry, guilty, anxious, or sad. You are unable to have a bowel movement (constipation). Your skin is dry. You have swelling in your legs or another part of your body. Get help right away if: You feel confused. Your vision is blurry. You feel faint or you pass out. You have a severe headache. You have severe pain in your abdomen, your back, or the area between your waist and hips (pelvis). You have chest pain, shortness of breath, or an irregular or fast heartbeat. You are unable to urinate, or you urinate less than normal. You have abnormal bleeding, such as bleeding from the rectum, vagina, nose, lungs, or nipples. You vomit blood. You have thoughts about hurting yourself or others. If you ever feel like you may hurt yourself or others, or have thoughts about taking your own life, get help right away. You can go to your nearest emergency department or call: Your local emergency services (911 in the [place] A suicide crisis helpline, such as the [facility] at (463) 224-4446 or 988 in the [place]. This is open 24 hours a day. Summary If you have fatigue, you feel tired all the time and have a lack of energy or a lack of motivation. Fatigue may make it difficult to start or complete tasks because of exhaustion. Long-lasting (chronic) or extreme fatigue may be a symptom of a medical condition. Exercise regularly, as told by your health care provider. Change situations that  cause you stress. Try to keep your work and personal schedule in balance. This information is not intended to replace advice given to you by your health care provider. Make sure you discuss any questions you have with your health care provider. Document Revised: 04/20/2021 Document Reviewed: 08/05/2020 Elsevier Patient Education  2022 [employer].
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
## 2021-09-27 ENCOUNTER — Telehealth: Payer: 59 | Admitting: Nurse Practitioner

## 2021-09-27 DIAGNOSIS — R6889 Other general symptoms and signs: Secondary | ICD-10-CM

## 2021-09-27 MED ORDER — OSELTAMIVIR PHOSPHATE 75 MG PO CAPS: 75.0000 mg | ORAL_CAPSULE | Freq: Two times a day (BID) | ORAL | 0 refills | Status: DC

## 2021-09-27 NOTE — Progress Notes (Signed)
E visit for Flu like symptoms   We are sorry that you are not feeling well.  Here is how we plan to help! Based on what you have shared with me it looks like you may have a respiratory virus that may be influenza.  Work note is in your my chart  Influenza or the flu is   an infection caused by a respiratory virus. The flu virus is highly contagious and persons who did not receive their yearly flu vaccination may catch the flu from close contact.  We have anti-viral medications to treat the viruses that cause this infection. They are not a cure and only shorten the course of the infection. These prescriptions are most effective when they are given within the first 2 days of flu symptoms. Antiviral medication are indicated if you have a high risk of complications from the flu. You should  also consider an antiviral medication if you are in close contact with someone who is at risk. These medications can help patients avoid complications from the flu  but have side effects that you should know. Possible side effects from Tamiflu or oseltamivir include nausea, vomiting, diarrhea, dizziness, headaches, eye redness, sleep problems or other respiratory symptoms. You should not take Tamiflu if you have an allergy to oseltamivir or any to the ingredients in Tamiflu.  Based upon your symptoms and potential risk factors I have prescribed Oseltamivir (Tamiflu).  It has been sent to your designated pharmacy.  You will take one 75 mg capsule orally twice a day for the next 5 days.  ANYONE WHO HAS FLU SYMPTOMS SHOULD: Stay home. The flu is highly contagious and going out or to work exposes others! Be sure to drink plenty of fluids. Water is fine as well as fruit juices, sodas and electrolyte beverages. You may want to stay away from caffeine or alcohol. If you are nauseated, try taking small sips of liquids. How do you know if you are getting enough fluid? Your urine should be a pale yellow or almost  colorless. Get rest. Taking a steamy shower or using a humidifier may help nasal congestion and ease sore throat pain. Using a saline nasal spray works much the same way. Cough drops, hard candies and sore throat lozenges may ease your cough. Line up a caregiver. Have someone check on you regularly.   GET HELP RIGHT AWAY IF: You cannot keep down liquids or your medications. You become short of breath Your fell like you are going to pass out or loose consciousness. Your symptoms persist after you have completed your treatment plan MAKE SURE YOU  Understand these instructions. Will watch your condition. Will get help right away if you are not doing well or get worse.  Your e-visit answers were reviewed by a board certified advanced clinical practitioner to complete your personal care plan.  Depending on the condition, your plan could have included both over the counter or prescription medications.  If there is a problem please reply  once you have received a response from your provider.  Your safety is important to [place].  If you have drug allergies check your prescription carefully.    You can use MyChart to ask questions about todays visit, request a non-urgent call back, or ask for a work or school excuse for 24 hours related to this e-Visit. If it has been greater than 24 hours you will need to follow up with your provider, or enter a new e-Visit to address those concerns.  You will get an e-mail in the next two days asking about your experience.  I hope that your e-visit has been valuable and will speed your recovery. Thank you for using e-visits.  5-10 minutes spent reviewing and documenting in chart.

## 2021-09-29 ENCOUNTER — Telehealth: Payer: 59 | Admitting: Physician Assistant

## 2021-09-29 ENCOUNTER — Encounter: Payer: Self-pay | Admitting: Family Medicine

## 2021-09-29 DIAGNOSIS — R6889 Other general symptoms and signs: Secondary | ICD-10-CM

## 2021-09-29 MED ORDER — BENZONATATE 100 MG PO CAPS: 100.0000 mg | ORAL_CAPSULE | Freq: Three times a day (TID) | ORAL | 0 refills | Status: DC | PRN

## 2021-09-29 NOTE — Patient Instructions (Signed)
Joanne Kim, thank you for joining [name], PA-C for today's virtual visit.  While this provider is not your primary care provider (PCP), if your PCP is located in our provider database this encounter information will be shared with them immediately following your visit.  Consent: (Patient) Joanne Kim provided verbal consent for this virtual visit at the beginning of the encounter.  Current Medications:  Current Outpatient Medications:    oseltamivir (TAMIFLU) 75 MG capsule, Take 1 capsule (75 mg total) by mouth 2 (two) times daily., Disp: 10 capsule, Rfl: 0   Progesterone Micronized 10 % CREA, Place 4 Pump onto the skin at bedtime., Disp: , Rfl:    Testosterone Compounding Kit 20 % CREA, Apply topically., Disp: , Rfl:    Medications ordered in this encounter:  No orders of the defined types were placed in this encounter.    *If you need refills on other medications prior to your next appointment, please contact your pharmacy*  Follow-Up: Call back or seek an in-person evaluation if the symptoms worsen or if the condition fails to improve as anticipated.  Other Instructions Please continue entire course of Tamiflu. Stay hydrated and get plenty of rest. Start OTC Vitamin D3 1000 units daily and Vitamin C 1000 mg daily. Start OTC Mucinex-DM for congestion and cough. The Tessalon will also help with cough.   If you have been instructed to have an in-person evaluation today at a local Urgent Care facility, please use the link below. It will take you to a list of all of our available [facility], including address, phone number and hours of operation. Please do not delay care.  [facility]  If you or a family member do not have a primary care provider, use the link below to schedule a visit and establish care. When you choose a [facility] primary care physician or advanced practice provider, you gain a long-term partner in  health. Find a Primary Care Provider  Learn more about [facility]'s in-office and virtual care options: [facility]

## 2021-09-29 NOTE — Progress Notes (Signed)
Virtual Visit Consent   Joanne Kim, you are scheduled for a virtual visit with a [facility] provider today.     Just as with appointments in the office, your consent must be obtained to participate.  Your consent will be active for this visit and any virtual visit you may have with one of our providers in the next 365 days.     If you have a MyChart account, a copy of this consent can be sent to you electronically.  All virtual visits are billed to your insurance company just like a traditional visit in the office.    As this is a virtual visit, video technology does not allow for your provider to perform a traditional examination.  This may limit your provider's ability to fully assess your condition.  If your provider identifies any concerns that need to be evaluated in person or the need to arrange testing (such as labs, EKG, etc.), we will make arrangements to do so.     Although advances in technology are sophisticated, we cannot ensure that it will always work on either your end or our end.  If the connection with a video visit is poor, the visit may have to be switched to a telephone visit.  With either a video or telephone visit, we are not always able to ensure that we have a secure connection.     I need to obtain your verbal consent now.   Are you willing to proceed with your visit today?    ERIC MORGANTI has provided verbal consent on 09/29/2021 for a virtual visit (video or telephone).   [name], [place]   Date: 09/29/2021 1:57 PM   Virtual Visit via Video Note   I, [name], connected with  Joanne Kim  ([id number], [id number]) on 09/29/21 at  1:45 PM EST by a video-enabled telemedicine application and verified that I am speaking with the correct person using two identifiers.  Location: Patient: Virtual Visit Location Patient: Home Provider: Virtual Visit Location Provider: Home Office   I discussed the limitations of evaluation  and management by telemedicine and the availability of in person appointments. The patient expressed understanding and agreed to proceed.    History of Present Illness: Joanne Kim is a 60 y.o. who identifies as a female who was assigned female at birth, and is being seen today for ongoing flu symptoms. She was seen 09/27/2021 and diagnosed with suspected influenza, started on Tamiflu. Endorses taking as directed but notes she does not feel well yet. Notes her aches and fever have improved and almost resolved. Nausea and diarrhea have resolved. Notes still with nasal and chest congestion with dry cough. Denies chest pain.   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  Resting comfortably at home.  Head is  normocephalic, atraumatic.  No labored breathing. Speech is clear and coherent with logical content.  Patient is alert and oriented at baseline.   Assessment and Plan: 1. Flu-like symptoms  Symptoms are improving just not as fast as she would like giving the holiday. Fever, aches, GI symptoms resolved. Still with chest congestion and cough. Supportive measures, OTC medications reviewed. Continue Tamiflu. Will add on Tessalon for cough to take along with OTC Mucinex-DM.  Follow Up Instructions: I discussed the assessment and treatment  plan with the patient. The patient was provided an opportunity to ask questions and all were answered. The patient agreed with the plan and demonstrated an understanding of the instructions.  A copy of instructions were sent to the patient via MyChart unless otherwise noted below.   The patient was advised to call back or seek an in-person evaluation if the symptoms worsen or if the condition fails to improve as anticipated.  Time:  I spent 12 minutes with the patient via telehealth technology discussing the above problems/concerns.    [name], PA-C

## 2021-10-04 ENCOUNTER — Ambulatory Visit: Payer: 59 | Admitting: Neurology

## 2021-10-12 ENCOUNTER — Telehealth: Payer: 59 | Admitting: Physician Assistant

## 2021-10-12 DIAGNOSIS — J019 Acute sinusitis, unspecified: Secondary | ICD-10-CM

## 2021-10-12 DIAGNOSIS — B9689 Other specified bacterial agents as the cause of diseases classified elsewhere: Secondary | ICD-10-CM

## 2021-10-12 MED ORDER — FLUCONAZOLE 150 MG PO TABS: 150.0000 mg | ORAL_TABLET | Freq: Once | ORAL | 0 refills | Status: AC

## 2021-10-12 MED ORDER — DOXYCYCLINE HYCLATE 100 MG PO TABS: 100.0000 mg | ORAL_TABLET | Freq: Two times a day (BID) | ORAL | 0 refills | Status: DC

## 2021-10-12 NOTE — Progress Notes (Signed)
Virtual Visit Consent   Joanne Kim, you are scheduled for a virtual visit with a [facility] provider today.     Just as with appointments in the office, your consent must be obtained to participate.  Your consent will be active for this visit and any virtual visit you may have with one of our providers in the next 365 days.     If you have a MyChart account, a copy of this consent can be sent to you electronically.  All virtual visits are billed to your insurance company just like a traditional visit in the office.    As this is a virtual visit, video technology does not allow for your provider to perform a traditional examination.  This may limit your provider's ability to fully assess your condition.  If your provider identifies any concerns that need to be evaluated in person or the need to arrange testing (such as labs, EKG, etc.), we will make arrangements to do so.     Although advances in technology are sophisticated, we cannot ensure that it will always work on either your end or our end.  If the connection with a video visit is poor, the visit may have to be switched to a telephone visit.  With either a video or telephone visit, we are not always able to ensure that we have a secure connection.     I need to obtain your verbal consent now.   Are you willing to proceed with your visit today?    Joanne Kim has provided verbal consent on 10/12/2021 for a virtual visit (video or telephone).   [name], [place]   Date: 10/12/2021 9:33 AM   Virtual Visit via Video Note   I, [name], connected with  Joanne Kim  ([id number], 1961-07-25) on 10/12/21 at  9:30 AM EST by a video-enabled telemedicine application and verified that I am speaking with the correct person using two identifiers.  Location: Patient: Virtual Visit Location Patient: Home Provider: Virtual Visit Location Provider: Home Office   I discussed the limitations of evaluation and  management by telemedicine and the availability of in person appointments. The patient expressed understanding and agreed to proceed.    History of Present Illness: Joanne Kim is a 61 y.o. who identifies as a female who was assigned female at birth, and is being seen today for ongoing URI symptoms after treatment for the flu > a week ago. Still with significant head congestion with sinus pressure, sinus drainage and pain with thick phlegm production. Notes occasional cough but mild. Denies fever.     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   Dry eyes 10/23/2016   Obesity 10/23/2016   RECTAL BLEEDING 12/14/2009    Allergies:  Allergies  Allergen Reactions   Oxycodone Itching   Medications:  Current Outpatient Medications:    doxycycline (VIBRA-TABS) 100 MG tablet, Take 1 tablet (100 mg total) by mouth 2 (two) times daily., Disp: 20 tablet, Rfl: 0   fluconazole (DIFLUCAN) 150 MG tablet, Take 1 tablet (150 mg total) by mouth once for 1 dose., Disp: 1 tablet, Rfl: 0   Progesterone Micronized 10 % CREA, Place 4 Pump onto the skin at bedtime., Disp: , Rfl:    Testosterone Compounding Kit 20 % CREA, Apply topically., Disp: , Rfl:   Observations/Objective: Patient is well-developed, well-nourished in no acute distress.  Resting  comfortably at home.  Head is normocephalic, atraumatic.  No labored breathing. Speech is clear and coherent with logical content.  Patient is alert and oriented at baseline.   Assessment and Plan: 1. Acute bacterial sinusitis - doxycycline (VIBRA-TABS) 100 MG tablet; Take 1 tablet (100 mg total) by mouth 2 (two) times daily.  Dispense: 20 tablet; Refill: 0  Rx Doxycycline.  Increase fluids.  Rest.  Saline nasal spray.  Probiotic.  Mucinex as directed.  Humidifier in bedroom. OTC medications reviewed.  Call or return to clinic if symptoms are not improving.   Follow  Up Instructions: I discussed the assessment and treatment plan with the patient. The patient was provided an opportunity to ask questions and all were answered. The patient agreed with the plan and demonstrated an understanding of the instructions.  A copy of instructions were sent to the patient via MyChart unless otherwise noted below.   The patient was advised to call back or seek an in-person evaluation if the symptoms worsen or if the condition fails to improve as anticipated.  Time:  I spent 12 minutes with the patient via telehealth technology discussing the above problems/concerns.    [name], PA-C

## 2021-10-12 NOTE — Patient Instructions (Addendum)
Joanne Kim, thank you for joining Joanne Rio, PA-C for today's virtual visit.  While this provider is not your primary care provider (PCP), if your PCP is located in our provider database this encounter information will be shared with them immediately following your visit.  Consent: (Patient) Joanne Kim provided verbal consent for this virtual visit at the beginning of the encounter.  Current Medications:  Current Outpatient Medications:    benzonatate (TESSALON) 100 MG capsule, Take 1 capsule (100 mg total) by mouth 3 (three) times daily as needed for cough., Disp: 40 capsule, Rfl: 0   oseltamivir (TAMIFLU) 75 MG capsule, Take 1 capsule (75 mg total) by mouth 2 (two) times daily., Disp: 10 capsule, Rfl: 0   Progesterone Micronized 10 % CREA, Place 4 Pump onto the skin at bedtime., Disp: , Rfl:    Testosterone Compounding Kit 20 % CREA, Apply topically., Disp: , Rfl:    Medications ordered in this encounter:  No orders of the defined types were placed in this encounter.    *If you need refills on other medications prior to your next appointment, please contact your pharmacy*  Follow-Up: Call back or seek an in-person evaluation if the symptoms worsen or if the condition fails to improve as anticipated.  Other Instructions Please take antibiotic as directed.  Increase fluid intake.  Use Saline nasal spray.  Take a daily multivitamin. You can use OTC Tylenol Cold and Sinus.  Place a humidifier in the bedroom.  Please call or return clinic if symptoms are not improving.  Sinusitis Sinusitis is redness, soreness, and swelling (inflammation) of the paranasal sinuses. Paranasal sinuses are air pockets within the bones of your face (beneath the eyes, the middle of the forehead, or above the eyes). In healthy paranasal sinuses, mucus is able to drain out, and air is able to circulate through them by way of your nose. However, when your paranasal sinuses are inflamed,  mucus and air can become trapped. This can allow bacteria and other germs to grow and cause infection. Sinusitis can develop quickly and last only a short time (acute) or continue over a long period (chronic). Sinusitis that lasts for more than 12 weeks is considered chronic.  CAUSES  Causes of sinusitis include: Allergies. Structural abnormalities, such as displacement of the cartilage that separates your nostrils (deviated septum), which can decrease the air flow through your nose and sinuses and affect sinus drainage. Functional abnormalities, such as when the small hairs (cilia) that line your sinuses and help remove mucus do not work properly or are not present. SYMPTOMS  Symptoms of acute and chronic sinusitis are the same. The primary symptoms are pain and pressure around the affected sinuses. Other symptoms include: Upper toothache. Earache. Headache. Bad breath. Decreased sense of smell and taste. A cough, which worsens when you are lying flat. Fatigue. Fever. Thick drainage from your nose, which often is green and may contain pus (purulent). Swelling and warmth over the affected sinuses. DIAGNOSIS  Your caregiver will perform a physical exam. During the exam, your caregiver may: Look in your nose for signs of abnormal growths in your nostrils (nasal polyps). Tap over the affected sinus to check for signs of infection. View the inside of your sinuses (endoscopy) with a special imaging device with a light attached (endoscope), which is inserted into your sinuses. If your caregiver suspects that you have chronic sinusitis, one or more of the following tests may be recommended: Allergy tests. Nasal culture A  sample of mucus is taken from your nose and sent to a lab and screened for bacteria. Nasal cytology A sample of mucus is taken from your nose and examined by your caregiver to determine if your sinusitis is related to an allergy. TREATMENT  Most cases of acute sinusitis are  related to a viral infection and will resolve on their own within 10 days. Sometimes medicines are prescribed to help relieve symptoms (pain medicine, decongestants, nasal steroid sprays, or saline sprays).  However, for sinusitis related to a bacterial infection, your caregiver will prescribe antibiotic medicines. These are medicines that will help kill the bacteria causing the infection.  Rarely, sinusitis is caused by a fungal infection. In theses cases, your caregiver will prescribe antifungal medicine. For some cases of chronic sinusitis, surgery is needed. Generally, these are cases in which sinusitis recurs more than 3 times per year, despite other treatments. HOME CARE INSTRUCTIONS  Drink plenty of water. Water helps thin the mucus so your sinuses can drain more easily. Use a humidifier. Inhale steam 3 to 4 times a day (for example, sit in the bathroom with the shower running). Apply a warm, moist washcloth to your face 3 to 4 times a day, or as directed by your caregiver. Use saline nasal sprays to help moisten and clean your sinuses. Take over-the-counter or prescription medicines for pain, discomfort, or fever only as directed by your caregiver. SEEK IMMEDIATE MEDICAL CARE IF: You have increasing pain or severe headaches. You have nausea, vomiting, or drowsiness. You have swelling around your face. You have vision problems. You have a stiff neck. You have difficulty breathing. MAKE SURE YOU:  Understand these instructions. Will watch your condition. Will get help right away if you are not doing well or get worse. Document Released: 09/25/2005 Document Revised: 12/18/2011 Document Reviewed: 10/10/2011 [facility] Patient Information 2014 [place], [place].    If you have been instructed to have an in-person evaluation today at a local Urgent Care facility, please use the link below. It will take you to a list of all of our available [facility], including address, phone  number and hours of operation. Please do not delay care.  [facility]  If you or a family member do not have a primary care provider, use the link below to schedule a visit and establish care. When you choose a [facility] primary care physician or advanced practice provider, you gain a long-term partner in health. Find a Primary Care Provider  Learn more about [facility] in-office and virtual care options: [facility]

## 2021-10-17 ENCOUNTER — Ambulatory Visit (INDEPENDENT_AMBULATORY_CARE_PROVIDER_SITE_OTHER): Payer: 59 | Admitting: Neurology

## 2021-10-17 DIAGNOSIS — G478 Other sleep disorders: Secondary | ICD-10-CM

## 2021-10-17 DIAGNOSIS — G4733 Obstructive sleep apnea (adult) (pediatric): Secondary | ICD-10-CM

## 2021-10-17 DIAGNOSIS — R400 Somnolence: Secondary | ICD-10-CM

## 2021-10-17 DIAGNOSIS — G9331 Postviral fatigue syndrome: Secondary | ICD-10-CM

## 2021-10-18 ENCOUNTER — Encounter: Payer: Self-pay | Admitting: Family Medicine

## 2021-10-19 MED ORDER — PREDNISONE 20 MG PO TABS: ORAL_TABLET | ORAL | 0 refills | Status: DC
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
## 2021-10-26 DIAGNOSIS — G4733 Obstructive sleep apnea (adult) (pediatric): Secondary | ICD-10-CM | POA: Insufficient documentation

## 2021-10-26 DIAGNOSIS — R400 Somnolence: Secondary | ICD-10-CM | POA: Insufficient documentation

## 2021-10-26 DIAGNOSIS — G478 Other sleep disorders: Secondary | ICD-10-CM | POA: Insufficient documentation

## 2021-10-26 DIAGNOSIS — G9331 Postviral fatigue syndrome: Secondary | ICD-10-CM | POA: Insufficient documentation
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
## 2021-10-27 ENCOUNTER — Encounter: Payer: Self-pay | Admitting: Neurology

## 2021-10-27 NOTE — Telephone Encounter (Signed)
I called pt. I advised pt that Dr. Brett Fairy reviewed their sleep study results and found that pt has moderate to severe sleep apnea. Dr. Brett Fairy recommends that pt starts auto cpap. I reviewed PAP compliance expectations with the pt. Pt is agreeable to starting a CPAP. I advised pt that an order will be sent to a DME, [facility], and [facility] will call the pt within about one week after they file with the pt's insurance. [facility] will show the pt how to use the machine, fit for masks, and troubleshoot the CPAP if needed. A follow up appt was made for insurance purposes with Judson Roch on 12/21/21 at 12:45. Pt verbalized understanding to arrive 15 minutes early and bring their CPAP. A letter with all of this information in it will be mailed to the pt as a reminder. I verified with the pt that the address we have on file is correct. Pt verbalized understanding of results. Pt had no questions at this time but was encouraged to call back if questions arise. I have sent the order to [facility] and have received confirmation that they have received the order.

## 2021-11-03 ENCOUNTER — Telehealth: Payer: Self-pay | Admitting: Neurology

## 2021-11-03 NOTE — Telephone Encounter (Signed)
Pt's new insurance information: Independent [employer] Member ID: [id number] [id number]: [id number] Phone [phone]

## 2021-12-08 ENCOUNTER — Telehealth: Payer: Self-pay

## 2021-12-08 NOTE — Telephone Encounter (Signed)
Received fax letter from [facility]) that they have made numerous attempts to contact pt to set up appt. Pt has not returned any calls. They will be sending pt a letter in the mail as a last attempt to get her sch.  ?

## 2021-12-21 ENCOUNTER — Encounter: Payer: Self-pay | Admitting: Neurology

## 2022-01-10 ENCOUNTER — Ambulatory Visit: Payer: Self-pay | Admitting: Neurology

## 2022-02-07 NOTE — Patient Instructions (Addendum)
It was good to see you again today!  ?Please update your COVID booster if you are due ?I would also recommend getting the shingles vaccine at your convenience  ?We will be in touch with your labs asap- please schedule your pap for this summer - I would suggest discussing any hormone replacement plans with Dr Cecille Po prior to restarting this program  ?

## 2022-02-07 NOTE — Progress Notes (Addendum)
Therapist, music at [employer] ?[address], [address] ?[place],  [address] ?336 [phone] ?Fax [phone] ? ?Date:  02/09/2022  ? ?Name:  Joanne Kim   DOB:  03-06-1961   MRN:  [id number] ? ?PCP:  [name], MD  ? ? ?Chief Complaint: Anemia (Pt states her Hemoglobin was an 8. She went to donate blood on Friday and this is when she found out. She has stopped eating red meet a couple of months ago. /Concerns/ questions: sinus congestion) ? ? ?History of Present Illness: ? ?Joanne Kim is a 61 y.o. very pleasant female patient who presents with the following: ? ?Patient seen today with concern of possible significant anemia ?Most recent visit with myself was in September when she was feeling fatigued ?We did not get a CBC at that time, CBC in June of last year showed hemoglobin of 14.2 ?She did undergo a sleep study in January, noted to have moderate to severe sleep apnea and CPAP recommended ? ?Shingrix- mentioned to her today  ?Pap smear due in June, can update today if she would like; she prefers to do w her GYN  ? ?She recently tried to donate blood and was told that her hg was 8!   ?She did cut out red meat a few months ago- however  ? ?No vaginal bleeding, no epistaxis ?No change in her bruising tendency  ?She just did her colonoscopy last year  ?She may have very rare hemorrhoidal bleeding but no significant GI bleeding, no melena  ? ?She would like to get back on Singulair for her sinus symptoms  ? ?Patient Active Problem List  ? Diagnosis Date Noted  ? Moderate obstructive sleep apnea-hypopnea syndrome 10/26/2021  ? Intermittent sleepiness 10/26/2021  ? Non-restorative sleep 10/26/2021  ? Postviral fatigue syndrome 10/26/2021  ? Aphakic open-angle glaucoma, 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  ? Dry eyes 10/23/2016  ? Obesity 10/23/2016  ? RECTAL BLEEDING 12/14/2009  ? ? ?Past Medical History:  ?Diagnosis Date  ? Allergy   ? Elevated blood pressure reading   ? History of colon  polyps   ? Vaginal Pap smear, abnormal 2017  ? ? ?Past Surgical History:  ?Procedure Laterality Date  ? BREAST BIOPSY    ? BUNIONECTOMY    ? COLONOSCOPY  2011  ? ? ?Social History  ? ?Tobacco Use  ? Smoking status: Never  ? Smokeless tobacco: Never  ?Substance Use Topics  ? Alcohol use: Yes  ?  Comment: occasionally  ? Drug use: No  ? ? ?Family History  ?Problem Relation Age of Onset  ? Heart disease Other   ? Stroke Other   ? Hypertension Other   ? Breast cancer Maternal Aunt   ? Sarcoidosis Sister   ? Allergic rhinitis Sister   ? Asthma Son   ? Allergic rhinitis Son   ? Urticaria Son   ? Colon polyps Mother   ? Allergic rhinitis Grandchild   ? Eczema Grandchild   ? Urticaria Niece   ? Colon cancer Neg Hx   ? Esophageal cancer Neg Hx   ? Rectal cancer Neg Hx   ? Stomach cancer Neg Hx   ? Angioedema Neg Hx   ? Immunodeficiency Neg Hx   ? ? ?Allergies  ?Allergen Reactions  ? Oxycodone Itching  ? ? ?Medication list has been reviewed and updated. ? ?Current Outpatient Medications on File Prior to Visit  ?Medication Sig Dispense Refill  ? Progesterone Micronized 10 %  CREA Place 4 Pump onto the skin at bedtime.    ? Testosterone Compounding Kit 20 % CREA Apply topically.    ? ?No current facility-administered medications on file prior to visit.  ? ? ?Review of Systems: ? ?As per HPI- otherwise negative. ? ? ?Physical Examination: ?Vitals:  ? 02/09/22 0846  ?BP: 110/62  ?Pulse: 76  ?Resp: 18  ?Temp: 98.2 ?F (36.8 ?C)  ?SpO2: 99%  ? ?Vitals:  ? 02/09/22 0846  ?Weight: 234 lb 9.6 oz (106.4 kg)  ?Height: '5\' 9"'  (1.753 m)  ? ?Body mass index is 34.64 kg/m?. ?Ideal Body Weight: Weight in (lb) to have BMI = 25: 168.9 ? ?GEN: no acute distress.  Obese, looks well  ?Conjunctivae are not pale  ?HEENT: Atraumatic, Normocephalic. Bilateral TM wnl, oropharynx normal.  PEERL,EOMI.   ?Ears and Nose: No external deformity. ?CV: RRR, No M/G/R. No JVD. No thrill. No extra heart sounds. ?PULM: CTA B, no wheezes, crackles, rhonchi. No  retractions. No resp. distress. No accessory muscle use. ?ABD: S, NT, ND ?EXTR: No c/c/e ?PSYCH: Normally interactive. Conversant.  ? ? ?Assessment and Plan: ?Anemia, unspecified type - Plan: CBC, Ferritin ? ?Screening for hyperlipidemia - Plan: Lipid panel ? ?Screening for diabetes mellitus - Plan: Comprehensive metabolic panel, Hemoglobin A1c ? ?Seasonal allergic rhinitis due to pollen - Plan: montelukast (SINGULAIR) 10 MG tablet ?Pt reports a hg of 8 at recent attempt at blood donation- we will confirm this today and check ferritin, other labs as above' ?She notes recurrent seasonal sinus sx and would like to go back on Singulair- also advised her to continue nasal steroid spray and zyrtec use  ? ?Patient also notes she was previously going to what sounds like a "med spa" and was receiving topical estrogen and testosterone.  She is no longer using this-notes that she felt better while she was using the steroids but is not sure if these are healthy for her.  Advised that typically I would avoid using hormone replacement and especially testosterone in women unless absolutely necessary.  She will be seeing her gynecologist soon, suggested asking her GYN about more standard hormone replacement therapy which could be appropriate ? ? ?Signed ?Lamar Blinks, MD ? ?Received labs as below, message to patient ?It appears very low hemoglobin must have been a lab error ?Results for orders placed or performed in visit on 02/09/22  ?CBC  ?Result Value Ref Range  ? WBC 5.2 4.0 - 10.5 K/uL  ? RBC 4.82 3.87 - 5.11 Mil/uL  ? Platelets 163.0 150.0 - 400.0 K/uL  ? Hemoglobin 13.9 12.0 - 15.0 g/dL  ? HCT 42.9 36.0 - 46.0 %  ? MCV 89.0 78.0 - 100.0 fl  ? MCHC 32.4 30.0 - 36.0 g/dL  ? RDW 15.6 (H) 11.5 - 15.5 %  ?Comprehensive metabolic panel  ?Result Value Ref Range  ? Sodium 141 135 - 145 mEq/L  ? Potassium 4.0 3.5 - 5.1 mEq/L  ? Chloride 105 96 - 112 mEq/L  ? CO2 29 19 - 32 mEq/L  ? Glucose, Bld 70 70 - 99 mg/dL  ? BUN 11 6 - 23  mg/dL  ? Creatinine, Ser 0.80 0.40 - 1.20 mg/dL  ? Total Bilirubin 0.8 0.2 - 1.2 mg/dL  ? Alkaline Phosphatase 83 39 - 117 U/L  ? AST 18 0 - 37 U/L  ? ALT 11 0 - 35 U/L  ? Total Protein 6.6 6.0 - 8.3 g/dL  ? Albumin 4.0 3.5 - 5.2 g/dL  ?  GFR 80.99 >60.00 mL/min  ? Calcium 9.4 8.4 - 10.5 mg/dL  ?Hemoglobin A1c  ?Result Value Ref Range  ? Hgb A1c MFr Bld 5.6 4.6 - 6.5 %  ?Lipid panel  ?Result Value Ref Range  ? Cholesterol 201 (H) 0 - 200 mg/dL  ? Triglycerides 166.0 (H) 0.0 - 149.0 mg/dL  ? HDL 51.40 >39.00 mg/dL  ? VLDL 33.2 0.0 - 40.0 mg/dL  ? LDL Cholesterol 117 (H) 0 - 99 mg/dL  ? Total CHOL/HDL Ratio 4   ? NonHDL 149.81   ?Ferritin  ?Result Value Ref Range  ? Ferritin 31.2 10.0 - 291.0 ng/mL  ? ? ? ?

## 2022-02-09 ENCOUNTER — Ambulatory Visit: Payer: BC Managed Care – PPO | Admitting: Family Medicine

## 2022-02-09 ENCOUNTER — Encounter: Payer: Self-pay | Admitting: Family Medicine

## 2022-02-09 VITALS — BP 110/62 | HR 76 | Temp 98.2°F | Resp 18 | Ht 69.0 in | Wt 234.6 lb

## 2022-02-09 DIAGNOSIS — D649 Anemia, unspecified: Secondary | ICD-10-CM

## 2022-02-09 DIAGNOSIS — Z13220 Encounter for screening for lipoid disorders: Secondary | ICD-10-CM

## 2022-02-09 DIAGNOSIS — J301 Allergic rhinitis due to pollen: Secondary | ICD-10-CM

## 2022-02-09 DIAGNOSIS — Z131 Encounter for screening for diabetes mellitus: Secondary | ICD-10-CM

## 2022-02-09 LAB — COMPREHENSIVE METABOLIC PANEL
ALT: 11 U/L (ref 0–35)
AST: 18 U/L (ref 0–37)
Albumin: 4 g/dL (ref 3.5–5.2)
Alkaline Phosphatase: 83 U/L (ref 39–117)
BUN: 11 mg/dL (ref 6–23)
CO2: 29 mEq/L (ref 19–32)
Calcium: 9.4 mg/dL (ref 8.4–10.5)
Chloride: 105 mEq/L (ref 96–112)
Creatinine, Ser: 0.8 mg/dL (ref 0.40–1.20)
GFR: 80.99 mL/min (ref >60.00)
Glucose, Bld: 70 mg/dL (ref 70–99)
Potassium: 4 mEq/L (ref 3.5–5.1)
Sodium: 141 mEq/L (ref 135–145)
Total Bilirubin: 0.8 mg/dL (ref 0.2–1.2)
Total Protein: 6.6 g/dL (ref 6.0–8.3)

## 2022-02-09 LAB — LIPID PANEL
Cholesterol: 201 mg/dL — ABNORMAL HIGH (ref 0–200)
HDL: 51.4 mg/dL (ref >39.00)
LDL Cholesterol: 117 mg/dL — ABNORMAL HIGH (ref 0–99)
NonHDL: 149.81
Total CHOL/HDL Ratio: 4
Triglycerides: 166 mg/dL — ABNORMAL HIGH (ref 0.0–149.0)
VLDL: 33.2 mg/dL (ref 0.0–40.0)

## 2022-02-09 LAB — CBC
HCT: 42.9 % (ref 36.0–46.0)
Hemoglobin: 13.9 g/dL (ref 12.0–15.0)
MCHC: 32.4 g/dL (ref 30.0–36.0)
MCV: 89 fl (ref 78.0–100.0)
Platelets: 163 10*3/uL (ref 150.0–400.0)
RBC: 4.82 Mil/uL (ref 3.87–5.11)
RDW: 15.6 % — ABNORMAL HIGH (ref 11.5–15.5)
WBC: 5.2 10*3/uL (ref 4.0–10.5)

## 2022-02-09 LAB — FERRITIN: Ferritin: 31.2 ng/mL (ref 10.0–291.0)

## 2022-02-09 LAB — HEMOGLOBIN A1C: Hgb A1c MFr Bld: 5.6 % (ref 4.6–6.5)

## 2022-02-09 MED ORDER — MONTELUKAST SODIUM 10 MG PO TABS: 10.0000 mg | ORAL_TABLET | Freq: Every day | ORAL | 3 refills | Status: DC

## 2022-03-14 ENCOUNTER — Telehealth: Payer: BC Managed Care – PPO | Admitting: Physician Assistant

## 2022-03-14 DIAGNOSIS — R509 Fever, unspecified: Secondary | ICD-10-CM

## 2022-03-14 DIAGNOSIS — Z9189 Other specified personal risk factors, not elsewhere classified: Secondary | ICD-10-CM

## 2022-03-15 NOTE — Progress Notes (Signed)
Because of severe headache and flu-like symptoms along with the localized rash, I feel your condition warrants further evaluation and I recommend that you be seen in a face to face visit. I want to make sure you get a detailed examination and evaluation to make sure proper course and duration of treatment are given.    NOTE: There will be NO CHARGE for this eVisit   If you are having a true medical emergency please call 911.      For an urgent face to face visit, [facility] has six urgent care centers for your convenience:     [facility] [place] Get Driving Directions [phone] [address], [place] [address]    [facility]) Get Driving Directions [phone] [place], [place] [address]  [facility]) Get Driving Directions [phone] [address] [place],  [place]  [address]  [facility] Get Driving Directions [phone] [address], [place], [place] [address]   [facility] Get Driving Directions  [phone] [address], [place] [address]   [facility] at  Get Driving Directions [phone] [address], [place], [place] [address]  Your MyChart E-visit questionnaire answers were reviewed by a board certified advanced clinical practitioner to complete your personal care plan based on your specific symptoms.  Thank you for using e-Visits.

## 2022-03-17 ENCOUNTER — Encounter: Payer: Self-pay | Admitting: Family Medicine

## 2022-03-17 ENCOUNTER — Ambulatory Visit (INDEPENDENT_AMBULATORY_CARE_PROVIDER_SITE_OTHER): Payer: BC Managed Care – PPO | Admitting: Family Medicine

## 2022-03-17 VITALS — BP 130/70 | HR 72 | Temp 98.4°F | Resp 18 | Ht 69.0 in | Wt 234.0 lb

## 2022-03-17 DIAGNOSIS — W57XXXA Bitten or stung by nonvenomous insect and other nonvenomous arthropods, initial encounter: Secondary | ICD-10-CM

## 2022-03-17 DIAGNOSIS — S70362A Insect bite (nonvenomous), left thigh, initial encounter: Secondary | ICD-10-CM | POA: Diagnosis not present

## 2022-03-17 DIAGNOSIS — J302 Other seasonal allergic rhinitis: Secondary | ICD-10-CM | POA: Diagnosis not present

## 2022-03-17 DIAGNOSIS — J301 Allergic rhinitis due to pollen: Secondary | ICD-10-CM

## 2022-03-17 MED ORDER — DOXYCYCLINE HYCLATE 100 MG PO TABS: 100.0000 mg | ORAL_TABLET | Freq: Two times a day (BID) | ORAL | 0 refills | Status: DC

## 2022-03-17 MED ORDER — MONTELUKAST SODIUM 10 MG PO TABS: 10.0000 mg | ORAL_TABLET | Freq: Every day | ORAL | 3 refills | Status: DC

## 2022-03-17 MED ORDER — LEVOCETIRIZINE DIHYDROCHLORIDE 5 MG PO TABS: 5.0000 mg | ORAL_TABLET | Freq: Every evening | ORAL | 5 refills | Status: DC

## 2022-03-17 NOTE — Patient Instructions (Signed)
Tick Bite Information, Adult Ticks are insects that draw blood for food. Most ticks live in shrubs and grassy and wooded areas. They climb onto people and animals that brush against the leaves and grasses that they rest on. Then they bite, attaching themselves to the skin. Most ticks are harmless, but some ticks may carry germs that can spread to a person through a bite and cause a disease. To reduce your risk of getting a disease from a tick bite, make sure you: Take steps to prevent tick bites. Check for ticks after being outdoors where ticks live. Watch for symptoms of disease if a tick attached to you or if you suspect a tick bite. How can I prevent tick bites? Take these steps to help prevent tick bites when you go outdoors in an area where ticks live: Use insect repellent Use insect repellent that has DEET (20% or higher), picaridin, or IR3535 in it. Follow the instructions on the label. Use these products on: Bare skin. The top of your boots. Your pant legs. Your sleeve cuffs. For insect repellent that contains permethrin, follow the instructions on the label. Use these products on: Clothing. Boots. Outdoor gear. Tents. When you are outside Wear protective clothing. Long sleeves and long pants offer the best protection from ticks. Wear light-colored clothing so you can see ticks more easily. Tuck your pant legs into your socks. If you go walking on a trail, stay in the middle of the trail so your skin, hair, and clothing do not touch the bushes. Avoid walking through areas with long grass. Check for ticks on your clothing, hair, and skin often while you are outside, and check again before you go inside. Make sure to check the scalp, neck, armpits, waist, groin, and joint areas. These are the spots where ticks attach themselves most often. When you go indoors Check your clothing for ticks. Tumble dry clothes in a dryer on high heat for at least 10 minutes. If clothes are damp,  additional time may be needed. If clothes require washing, use hot water. Examine gear and pets. Shower soon after being outdoors. Check your body for ticks. Conduct a full body check using a mirror. What is the proper way to remove a tick? If you find a tick on your body, remove it as soon as possible. Removing a tick sooner can prevent germs from passing to your body. Do not remove the tick with your bare fingers. To remove a tick that is crawling on your skin but has not bitten, use either of these methods: Go outdoors and brush the tick off. Remove the tick with tape or a lint roller. To remove a tick that is attached to your skin: Wash your hands. If you have latex gloves, put them on. Use fine-tipped tweezers, curved forceps, or a tick-removal tool to gently grasp the tick as close to your skin and the tick's head as possible. Gently pull with a steady, upward, even pressure until the tick lets go. When removing the tick: Take care to keep the tick's head attached to its body. Do not twist or jerk the tick. This can make the tick's head or mouth parts break off and remain in the skin. Do not squeeze or crush the tick's body. This could force disease-carrying fluids from the tick into your body. Do not try to remove a tick with heat, alcohol, petroleum jelly, or fingernail polish. Using these methods can cause the tick to salivate and regurgitate into your bloodstream,   increasing your risk of getting a disease. What should I do after removing a tick? Dispose of the tick. Do not crush a tick with your fingers. Clean the bite area and your hands with soap and water, rubbing alcohol, or an iodine scrub. If an antiseptic cream or ointment is available, apply a small amount to the bite site. Wash and disinfect any instruments that you used to remove the tick. How should I dispose of a tick? To dispose of a live tick, use one of these methods: Place it in rubbing alcohol. Place it in a sealed  bag or container. Wrap it tightly in tape. Flush it down the toilet. Contact a health care provider if: You have symptoms of a disease after a tick bite. Symptoms of a tick-borne disease can occur from moments after the tick bites to 30 days after a tick is removed. Symptoms include: Fever or chills. Any of these signs in the bite area: A red rash that makes a circle (bull's-eye rash) in the bite area. Redness and swelling. Headache. Muscle, joint, or bone pain. Abnormal tiredness. Numbness in your legs or difficulty walking or moving your legs. Tender, swollen lymph glands. A part of a tick breaks off and gets stuck in your skin. Get help right away if: You are not able to remove a tick. You experience muscle weakness or paralysis. Your symptoms get worse or you experience new symptoms. You find an engorged tick on your skin and you are in an area where disease from ticks is a high risk. Summary Ticks may carry germs that can spread to a person through a bite and cause a disease. Wear protective clothing and use insect repellent to prevent tick bites. Follow the instructions on the label. If you find a tick on your body, remove it as soon as possible. If the tick is attached, do not try to remove with heat, alcohol, petroleum jelly, or fingernail polish. Remove the attached tick using fine-tipped tweezers, curved forceps, or a tick-removal tool. Gently pull with steady, upward, even pressure until the tick lets go. Do not twist or jerk the tick. Do not squeeze or crush the tick's body. If you have symptoms of a disease after being bitten by a tick, contact a health care provider. This information is not intended to replace advice given to you by your health care provider. Make sure you discuss any questions you have with your health care provider. Document Revised: 09/22/2019 Document Reviewed: 09/22/2019 Elsevier Patient Education  2023 Elsevier Inc.  

## 2022-03-17 NOTE — Progress Notes (Signed)
Established Patient Office Visit  Subjective   Patient ID: Joanne Kim, female    DOB: [birth year]  Age: 61 y.o. MRN: [id number]  Chief Complaint  Patient presents with   Tick Bite    Pt had two tick bites. One on left inner thigh and other middle back. About 8-10 to days. Pt states having cough, sinus drainage, body aches, off balance.     HPI Pt is her c/o tick bite about 10 days ago and then a few days ago she found another tick.    She has had some tingling and off balance. She now has some sinus drainage that is clear .   She has a sore throat and just got over strep throat and swollen tender glands .  Felt hot but she did not take her temp.   + body aches    Patient Active Problem List   Diagnosis Date Noted   Tick bite of left thigh 03/30/2022   Moderate obstructive sleep apnea-hypopnea syndrome 10/26/2021   Intermittent sleepiness 10/26/2021   Non-restorative sleep 10/26/2021   Postviral fatigue syndrome 10/26/2021   Aphakic open-angle glaucoma, 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   Dry eyes 10/23/2016   Obesity 10/23/2016   RECTAL BLEEDING 12/14/2009   Past Medical History:  Diagnosis Date   Allergy    Elevated blood pressure reading    History of colon polyps    Vaginal Pap smear, abnormal 2017   Past Surgical History:  Procedure Laterality Date   BREAST BIOPSY     BUNIONECTOMY     COLONOSCOPY  2011   Social History   Tobacco Use   Smoking status: Never   Smokeless tobacco: Never  Substance Use Topics   Alcohol use: Yes    Comment: occasionally   Drug use: No   Social History   Socioeconomic History   Marital status: Single    Spouse name: Not on file   Number of children: 1   Years of education: Not on file   Highest education level: Not on file  Occupational History   Not on file  Tobacco Use   Smoking status: Never   Smokeless tobacco: Never  Substance and Sexual Activity   Alcohol use: Yes    Comment: occasionally   Drug use: No   Sexual  activity: Yes    Birth control/protection: Condom  Other Topics Concern   Not on file  Social History Narrative   Lives alone   Right handed   Caffeine 2-3 cups of coffee, 1 soda a day.    Social Determinants of Health   Financial Resource Strain: Not on file  Food Insecurity: Not on file  Transportation Needs: Not on file  Physical Activity: Not on file  Stress: Not on file  Social Connections: Not on file  Intimate Partner Violence: Not on file   Family Status  Relation Name Status   Other  (Not Specified)   Mat Aunt  (Not Specified)   Sister  Alive   Son  Alive   Mother  Deceased   Father  Deceased   [place]  Alive   Niece  Alive   Neg Hx  (Not Specified)   Family History  Problem Relation Age of Onset   Heart disease Other    Stroke Other    Hypertension Other    Breast cancer Maternal Aunt    Sarcoidosis Sister    Allergic rhinitis Sister    Asthma Son    Allergic rhinitis Son  Urticaria Son    Colon polyps Mother    Allergic rhinitis Grandchild    Eczema Grandchild    Urticaria Niece    Colon cancer Neg Hx    Esophageal cancer Neg Hx    Rectal cancer Neg Hx    Stomach cancer Neg Hx    Angioedema Neg Hx    Immunodeficiency Neg Hx    Allergies  Allergen Reactions   Oxycodone Itching      ROS    Objective:     BP 130/70 (BP Location: Left Arm, Patient Position: Sitting, Cuff Size: Large)   Pulse 72   Temp 98.4 F (36.9 C) (Oral)   Resp 18   Ht '5\' 9"'$  (1.753 m)   Wt 234 lb (106.1 kg)   LMP 09/11/2016   SpO2 97%   BMI 34.56 kg/m  BP Readings from Last 3 Encounters:  03/17/22 130/70  02/09/22 110/62  09/05/21 134/86   Wt Readings from Last 3 Encounters:  03/17/22 234 lb (106.1 kg)  02/09/22 234 lb 9.6 oz (106.4 kg)  09/05/21 235 lb 8 oz (106.8 kg)   SpO2 Readings from Last 3 Encounters:  03/17/22 97%  02/09/22 99%  06/30/21 95%      Physical Exam   Results for orders placed or performed in visit on 03/17/22  Rocky mtn  spotted fvr abs pnl(IgG+IgM)  Result Value Ref Range   RMSF IgG Not Detected Not Detected   RMSF IgM Not Detected Not Detected  Lyme Disease Serology w/Reflex  Result Value Ref Range   Lyme Total Antibody EIA Negative Negative  CBC with Differential/Platelet  Result Value Ref Range   WBC 7.0 3.8 - 10.8 Thousand/uL   RBC 4.95 3.80 - 5.10 Million/uL   Hemoglobin 14.2 11.7 - 15.5 g/dL   HCT 43.8 35.0 - 45.0 %   MCV 88.5 80.0 - 100.0 fL   MCH 28.7 27.0 - 33.0 pg   MCHC 32.4 32.0 - 36.0 g/dL   RDW 13.6 11.0 - 15.0 %   Platelets 199 140 - 400 Thousand/uL   MPV 13.4 (H) 7.5 - 12.5 fL   Neutro Abs 4,445 1,500 - 7,800 cells/uL   Lymphs Abs 1,960 850 - 3,900 cells/uL   Absolute Monocytes 406 200 - 950 cells/uL   Eosinophils Absolute 140 15 - 500 cells/uL   Basophils Absolute 49 0 - 200 cells/uL   Neutrophils Relative % 63.5 %   Total Lymphocyte 28.0 %   Monocytes Relative 5.8 %   Eosinophils Relative 2.0 %   Basophils Relative 0.7 %  Comprehensive metabolic panel  Result Value Ref Range   Glucose, Bld 76 65 - 99 mg/dL   BUN 9 7 - 25 mg/dL   Creat 0.78 0.50 - 1.03 mg/dL   BUN/Creatinine Ratio NOT APPLICABLE 6 - 22 (calc)   Sodium 139 135 - 146 mmol/L   Potassium 4.0 3.5 - 5.3 mmol/L   Chloride 104 98 - 110 mmol/L   CO2 26 20 - 32 mmol/L   Calcium 9.9 8.6 - 10.4 mg/dL   Total Protein 6.8 6.1 - 8.1 g/dL   Albumin 4.0 3.6 - 5.1 g/dL   Globulin 2.8 1.9 - 3.7 g/dL (calc)   AG Ratio 1.4 1.0 - 2.5 (calc)   Total Bilirubin 0.9 0.2 - 1.2 mg/dL   Alkaline phosphatase (APISO) 91 37 - 153 U/L   AST 20 10 - 35 U/L   ALT 11 6 - 29 U/L     Last CBC Lab  Results  Component Value Date   WBC 7.0 03/17/2022   HGB 14.2 03/17/2022   HCT 43.8 03/17/2022   MCV 88.5 03/17/2022   MCH 28.7 03/17/2022   RDW 13.6 03/17/2022   PLT 199 19/50/9326   Last metabolic panel Lab Results  Component Value Date   GLUCOSE 76 03/17/2022   NA 139 03/17/2022   K 4.0 03/17/2022   CL 104 03/17/2022   CO2  26 03/17/2022   BUN 9 03/17/2022   CREATININE 0.78 03/17/2022   GFRNONAA >60 11/06/2015   CALCIUM 9.9 03/17/2022   PROT 6.8 03/17/2022   ALBUMIN 4.0 02/09/2022   BILITOT 0.9 03/17/2022   ALKPHOS 83 02/09/2022   AST 20 03/17/2022   ALT 11 03/17/2022   ANIONGAP 12 11/06/2015   Last lipids Lab Results  Component Value Date   CHOL 201 (H) 02/09/2022   HDL 51.40 02/09/2022   LDLCALC 117 (H) 02/09/2022   TRIG 166.0 (H) 02/09/2022   CHOLHDL 4 02/09/2022   Last hemoglobin A1c Lab Results  Component Value Date   HGBA1C 5.6 02/09/2022   Last thyroid functions Lab Results  Component Value Date   TSH 1.67 06/30/2021   Last vitamin D Lab Results  Component Value Date   VD25OH 34.40 06/30/2021   Last vitamin B12 and Folate Lab Results  Component Value Date   [id number] 338 06/30/2021      The 10-year ASCVD risk score (Arnett DK, et al., 2019) is: 4.8%    Assessment & Plan:   Problem List Items Addressed This Visit       Unprioritized   Tick bite of left thigh - Primary    Doxy sent in  rto if any more symptoms develop  Check labs      Relevant Medications   doxycycline (VIBRA-TABS) 100 MG tablet   Other Relevant Orders   Rocky mtn spotted fvr abs pnl(IgG+IgM) (Completed)   Lyme Disease Serology w/Reflex (Completed)   CBC with Differential/Platelet (Completed)   Comprehensive metabolic panel (Completed)   Other Visit Diagnoses     Seasonal allergies       Relevant Medications   levocetirizine (XYZAL) 5 MG tablet   Seasonal allergic rhinitis due to pollen       Relevant Medications   montelukast (SINGULAIR) 10 MG tablet       Return if symptoms worsen or fail to improve.    Ann Held, DO

## 2022-03-20 LAB — LYME DISEASE SEROLOGY W/REFLEX: Lyme Total Antibody EIA: NEGATIVE

## 2022-03-21 LAB — CBC WITH DIFFERENTIAL/PLATELET
Absolute Monocytes: 406 cells/uL (ref 200–950)
Basophils Absolute: 49 cells/uL (ref 0–200)
Basophils Relative: 0.7 %
Eosinophils Absolute: 140 cells/uL (ref 15–500)
Eosinophils Relative: 2 %
HCT: 43.8 % (ref 35.0–45.0)
Hemoglobin: 14.2 g/dL (ref 11.7–15.5)
Lymphs Abs: 1960 cells/uL (ref 850–3900)
MCH: 28.7 pg (ref 27.0–33.0)
MCHC: 32.4 g/dL (ref 32.0–36.0)
MCV: 88.5 fL (ref 80.0–100.0)
MPV: 13.4 fL — ABNORMAL HIGH (ref 7.5–12.5)
Monocytes Relative: 5.8 %
Neutro Abs: 4445 cells/uL (ref 1500–7800)
Neutrophils Relative %: 63.5 %
Platelets: 199 10*3/uL (ref 140–400)
RBC: 4.95 10*6/uL (ref 3.80–5.10)
RDW: 13.6 % (ref 11.0–15.0)
Total Lymphocyte: 28 %
WBC: 7 10*3/uL (ref 3.8–10.8)

## 2022-03-21 LAB — COMPREHENSIVE METABOLIC PANEL
AG Ratio: 1.4 (calc) (ref 1.0–2.5)
ALT: 11 U/L (ref 6–29)
AST: 20 U/L (ref 10–35)
Albumin: 4 g/dL (ref 3.6–5.1)
Alkaline phosphatase (APISO): 91 U/L (ref 37–153)
BUN: 9 mg/dL (ref 7–25)
CO2: 26 mmol/L (ref 20–32)
Calcium: 9.9 mg/dL (ref 8.6–10.4)
Chloride: 104 mmol/L (ref 98–110)
Creat: 0.78 mg/dL (ref 0.50–1.03)
Globulin: 2.8 g/dL (calc) (ref 1.9–3.7)
Glucose, Bld: 76 mg/dL (ref 65–99)
Potassium: 4 mmol/L (ref 3.5–5.3)
Sodium: 139 mmol/L (ref 135–146)
Total Bilirubin: 0.9 mg/dL (ref 0.2–1.2)
Total Protein: 6.8 g/dL (ref 6.1–8.1)

## 2022-03-21 LAB — ROCKY MTN SPOTTED FVR ABS PNL(IGG+IGM)
RMSF IgG: NOT DETECTED
RMSF IgM: NOT DETECTED

## 2022-03-23 ENCOUNTER — Ambulatory Visit: Payer: BC Managed Care – PPO | Admitting: Family Medicine

## 2022-03-30 DIAGNOSIS — W57XXXA Bitten or stung by nonvenomous insect and other nonvenomous arthropods, initial encounter: Secondary | ICD-10-CM | POA: Insufficient documentation

## 2022-03-30 NOTE — Assessment & Plan Note (Signed)
Doxy sent in  rto if any more symptoms develop  Check labs

## 2022-05-10 ENCOUNTER — Telehealth: Payer: Self-pay | Admitting: Family Medicine

## 2022-05-10 NOTE — Telephone Encounter (Signed)
Pt called stating that she would like to have a digital or physical copy of her labs from this year sent to her for documentation purposes.

## 2022-05-10 NOTE — Telephone Encounter (Signed)
Labs are up front per pt request.

## 2022-06-21 ENCOUNTER — Telehealth: Payer: BC Managed Care – PPO | Admitting: Physician Assistant

## 2022-06-21 DIAGNOSIS — J019 Acute sinusitis, unspecified: Secondary | ICD-10-CM

## 2022-06-21 DIAGNOSIS — B9689 Other specified bacterial agents as the cause of diseases classified elsewhere: Secondary | ICD-10-CM

## 2022-06-21 MED ORDER — AMOXICILLIN-POT CLAVULANATE 875-125 MG PO TABS: 1.0000 | ORAL_TABLET | Freq: Two times a day (BID) | ORAL | 0 refills | Status: DC

## 2022-06-21 MED ORDER — FLUCONAZOLE 150 MG PO TABS: ORAL_TABLET | ORAL | 0 refills | Status: DC

## 2022-06-21 NOTE — Progress Notes (Signed)
I have spent 5 minutes in review of e-visit questionnaire, review and updating patient chart, medical decision making and response to patient.   Chanti Golubski Cody Daryus Sowash, PA-C    

## 2022-06-21 NOTE — Progress Notes (Signed)

## 2022-06-21 NOTE — Addendum Note (Signed)
Addended by: Brunetta Jeans on: 06/21/2022 04:19 PM   Modules accepted: Orders

## 2022-06-26 ENCOUNTER — Encounter: Payer: Self-pay | Admitting: Physician Assistant

## 2022-06-26 ENCOUNTER — Telehealth: Payer: BC Managed Care – PPO | Admitting: Physician Assistant

## 2022-06-26 DIAGNOSIS — Z889 Allergy status to unspecified drugs, medicaments and biological substances status: Secondary | ICD-10-CM | POA: Diagnosis not present

## 2022-06-26 DIAGNOSIS — B9689 Other specified bacterial agents as the cause of diseases classified elsewhere: Secondary | ICD-10-CM | POA: Diagnosis not present

## 2022-06-26 DIAGNOSIS — J019 Acute sinusitis, unspecified: Secondary | ICD-10-CM

## 2022-06-26 MED ORDER — PREDNISONE 10 MG (21) PO TBPK: ORAL_TABLET | ORAL | 0 refills | Status: DC

## 2022-06-26 MED ORDER — DOXYCYCLINE HYCLATE 100 MG PO TABS: 100.0000 mg | ORAL_TABLET | Freq: Two times a day (BID) | ORAL | 0 refills | Status: DC

## 2022-06-26 NOTE — Progress Notes (Signed)
Virtual Visit Consent   Joanne Kim, you are scheduled for a virtual visit with a [facility] provider today. Just as with appointments in the office, your consent must be obtained to participate. Your consent will be active for this visit and any virtual visit you may have with one of our providers in the next 365 days. If you have a MyChart account, a copy of this consent can be sent to you electronically.  As this is a virtual visit, video technology does not allow for your provider to perform a traditional examination. This may limit your provider's ability to fully assess your condition. If your provider identifies any concerns that need to be evaluated in person or the need to arrange testing (such as labs, EKG, etc.), we will make arrangements to do so. Although advances in technology are sophisticated, we cannot ensure that it will always work on either your end or our end. If the connection with a video visit is poor, the visit may have to be switched to a telephone visit. With either a video or telephone visit, we are not always able to ensure that we have a secure connection.  By engaging in this virtual visit, you consent to the provision of healthcare and authorize for your insurance to be billed (if applicable) for the services provided during this visit. Depending on your insurance coverage, you may receive a charge related to this service.  I need to obtain your verbal consent now. Are you willing to proceed with your visit today? Joanne Kim has provided verbal consent on 06/26/2022 for a virtual visit (video or telephone). [name], [place]  Date: 06/26/2022 4:43 PM  Virtual Visit via Video Note   I, [name], connected with  Joanne Kim  ([id number], 1960-12-29) on 06/26/22 at  4:30 PM EDT by a video-enabled telemedicine application and verified that I am speaking with the correct person using two identifiers.  Location: Patient: Virtual  Visit Location Patient: Home Provider: Virtual Visit Location Provider: Home Office   I discussed the limitations of evaluation and management by telemedicine and the availability of in person appointments. The patient expressed understanding and agreed to proceed.    History of Present Illness: Joanne Kim is a 61 y.o. who identifies as a female who was assigned female at birth, and is being seen today for worsening sinus symptoms despite treatment with Augmentin.  Notes continued sinus pressure and sinus pain with some increased fatigue and facial pain.  Has been taking the antibiotic as directed along with some over-the-counter Mucinex sinus max and Flonase nasal spray.  Has also been taking an OTC antihistamine as previously directed.  After the first few doses of Augmentin, she started to note some itching of her skin and has developed a widespread bumpy rash.  Notes the son seems to make this worse but is still pretty pruritic even when at home out of the sun.  HPI: HPI  Problems:  Patient Active Problem List   Diagnosis Date Noted   Tick bite of left thigh 03/30/2022   Moderate obstructive sleep apnea-hypopnea syndrome 10/26/2021   Intermittent sleepiness 10/26/2021   Non-restorative sleep 10/26/2021   Postviral fatigue syndrome 10/26/2021   Aphakic open-angle glaucoma, 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   Dry eyes 10/23/2016   Obesity 10/23/2016   RECTAL BLEEDING 12/14/2009    Allergies:  Allergies  Allergen Reactions   Augmentin [Amoxicillin-Pot Clavulanate]     rash   Oxycodone Itching   Medications:  Current Outpatient Medications:    doxycycline (VIBRA-TABS) 100 MG tablet, Take 1 tablet (100 mg total) by mouth 2 (two) times daily., Disp: 20 tablet, Rfl: 0   predniSONE (STERAPRED UNI-PAK 21 TAB) 10 MG (21) TBPK tablet, Take following package directions, Disp: 21 tablet, Rfl: 0   fluconazole (DIFLUCAN) 150 MG tablet, Take 1 tablet PO once. Repeat in 3 days if needed., Disp: 2  tablet, Rfl: 0   levocetirizine (XYZAL) 5 MG tablet, Take 1 tablet (5 mg total) by mouth every evening., Disp: 30 tablet, Rfl: 5   montelukast (SINGULAIR) 10 MG tablet, Take 1 tablet (10 mg total) by mouth at bedtime. Use as needed for allergies, Disp: 90 tablet, Rfl: 3   Progesterone Micronized 10 % CREA, Place 4 Pump onto the skin at bedtime., Disp: , Rfl:    Testosterone Compounding Kit 20 % CREA, Apply topically., Disp: , Rfl:   Observations/Objective: Patient is well-developed, well-nourished in no acute distress.  Resting comfortably at home.  Head is normocephalic, atraumatic.  No labored breathing. Speech is clear and coherent with logical content.  Patient is alert and oriented at baseline.  Widespread, fine and erythematous maculopapular rash noted of upper chest and neck, bilateral upper and lower extremities.  Per patient, rash is also over abdomen and back, but these areas were not able to be examined via video today.  Assessment and Plan: 1. Drug allergy - predniSONE (STERAPRED UNI-PAK 21 TAB) 10 MG (21) TBPK tablet; Take following package directions  Dispense: 21 tablet; Refill: 0  Augmentin has been discontinued.  Supportive measures and OTC medications reviewed with patient.  Start Sterapred pack to help more quickly resolve this reaction.  Augmentin added to patient's list of medication allergies/intolerances.  2. Acute bacterial sinusitis - doxycycline (VIBRA-TABS) 100 MG tablet; Take 1 tablet (100 mg total) by mouth 2 (two) times daily.  Dispense: 20 tablet; Refill: 0  Start doxycycline per orders.  The steroids given for the medication reaction should also help to reduce sinus inflammation.  Continue OTC medications and nasal steroid spray as directed.  If not improving/resolving as expected, or any new or worsening symptoms despite treatment, she is to seek an in person evaluation ASAP.  Follow Up Instructions: I discussed the assessment and treatment plan with the  patient. The patient was provided an opportunity to ask questions and all were answered. The patient agreed with the plan and demonstrated an understanding of the instructions.  A copy of instructions were sent to the patient via MyChart unless otherwise noted below.   The patient was advised to call back or seek an in-person evaluation if the symptoms worsen or if the condition fails to improve as anticipated.  Time:  I spent 10 minutes with the patient via telehealth technology discussing the above problems/concerns.    [name], PA-C

## 2022-06-26 NOTE — Patient Instructions (Addendum)
Joanne Kim, thank you for joining [name], PA-C for today's virtual visit.  While this provider is not your primary care provider (PCP), if your PCP is located in our provider database this encounter information will be shared with them immediately following your visit.  Consent: (Patient) Joanne Kim provided verbal consent for this virtual visit at the beginning of the encounter.  Current Medications:  Current Outpatient Medications:    amoxicillin-clavulanate (AUGMENTIN) 875-125 MG tablet, Take 1 tablet by mouth 2 (two) times daily., Disp: 14 tablet, Rfl: 0   fluconazole (DIFLUCAN) 150 MG tablet, Take 1 tablet PO once. Repeat in 3 days if needed., Disp: 2 tablet, Rfl: 0   levocetirizine (XYZAL) 5 MG tablet, Take 1 tablet (5 mg total) by mouth every evening., Disp: 30 tablet, Rfl: 5   montelukast (SINGULAIR) 10 MG tablet, Take 1 tablet (10 mg total) by mouth at bedtime. Use as needed for allergies, Disp: 90 tablet, Rfl: 3   Progesterone Micronized 10 % CREA, Place 4 Pump onto the skin at bedtime., Disp: , Rfl:    Testosterone Compounding Kit 20 % CREA, Apply topically., Disp: , Rfl:    Medications ordered in this encounter:  No orders of the defined types were placed in this encounter.    *If you need refills on other medications prior to your next appointment, please contact your pharmacy*  Follow-Up: Call back or seek an in-person evaluation if the symptoms worsen or if the condition fails to improve as anticipated.  Other Instructions Stop the Augmentin as discussed.  Take the prednisone pack as directed.  Please take the new antibiotic (doxycycline) as directed.  Increase fluid intake.  Use Saline nasal spray.  Take a daily multivitamin. Continue your OTC medications .  Place a humidifier in the bedroom.  Please call or return clinic if symptoms are not improving.  Sinusitis Sinusitis is redness, soreness, and swelling (inflammation) of the  paranasal sinuses. Paranasal sinuses are air pockets within the bones of your face (beneath the eyes, the middle of the forehead, or above the eyes). In healthy paranasal sinuses, mucus is able to drain out, and air is able to circulate through them by way of your nose. However, when your paranasal sinuses are inflamed, mucus and air can become trapped. This can allow bacteria and other germs to grow and cause infection. Sinusitis can develop quickly and last only a short time (acute) or continue over a long period (chronic). Sinusitis that lasts for more than 12 weeks is considered chronic.  CAUSES  Causes of sinusitis include: Allergies. Structural abnormalities, such as displacement of the cartilage that separates your nostrils (deviated septum), which can decrease the air flow through your nose and sinuses and affect sinus drainage. Functional abnormalities, such as when the small hairs (cilia) that line your sinuses and help remove mucus do not work properly or are not present. SYMPTOMS  Symptoms of acute and chronic sinusitis are the same. The primary symptoms are pain and pressure around the affected sinuses. Other symptoms include: Upper toothache. Earache. Headache. Bad breath. Decreased sense of smell and taste. A cough, which worsens when you are lying flat. Fatigue. Fever. Thick drainage from your nose, which often is green and may contain pus (purulent). Swelling and warmth over the affected sinuses. DIAGNOSIS  Your caregiver will perform a physical exam. During the exam, your caregiver may: Look in your nose for signs of abnormal growths in your nostrils (nasal polyps). Tap over the affected sinus  to check for signs of infection. View the inside of your sinuses (endoscopy) with a special imaging device with a light attached (endoscope), which is inserted into your sinuses. If your caregiver suspects that you have chronic sinusitis, one or more of the following tests may be  recommended: Allergy tests. Nasal culture A sample of mucus is taken from your nose and sent to a lab and screened for bacteria. Nasal cytology A sample of mucus is taken from your nose and examined by your caregiver to determine if your sinusitis is related to an allergy. TREATMENT  Most cases of acute sinusitis are related to a viral infection and will resolve on their own within 10 days. Sometimes medicines are prescribed to help relieve symptoms (pain medicine, decongestants, nasal steroid sprays, or saline sprays).  However, for sinusitis related to a bacterial infection, your caregiver will prescribe antibiotic medicines. These are medicines that will help kill the bacteria causing the infection.  Rarely, sinusitis is caused by a fungal infection. In theses cases, your caregiver will prescribe antifungal medicine. For some cases of chronic sinusitis, surgery is needed. Generally, these are cases in which sinusitis recurs more than 3 times per year, despite other treatments. HOME CARE INSTRUCTIONS  Drink plenty of water. Water helps thin the mucus so your sinuses can drain more easily. Use a humidifier. Inhale steam 3 to 4 times a day (for example, sit in the bathroom with the shower running). Apply a warm, moist washcloth to your face 3 to 4 times a day, or as directed by your caregiver. Use saline nasal sprays to help moisten and clean your sinuses. Take over-the-counter or prescription medicines for pain, discomfort, or fever only as directed by your caregiver. SEEK IMMEDIATE MEDICAL CARE IF: You have increasing pain or severe headaches. You have nausea, vomiting, or drowsiness. You have swelling around your face. You have vision problems. You have a stiff neck. You have difficulty breathing. MAKE SURE YOU:  Understand these instructions. Will watch your condition. Will get help right away if you are not doing well or get worse. Document Released: 09/25/2005 Document Revised:  12/18/2011 Document Reviewed: 10/10/2011 [facility] Patient Information 2014 [place], [place].    If you have been instructed to have an in-person evaluation today at a local Urgent Care facility, please use the link below. It will take you to a list of all of our available [facility], including address, phone number and hours of operation. Please do not delay care.  [facility]  If you or a family member do not have a primary care provider, use the link below to schedule a visit and establish care. When you choose a [facility] primary care physician or advanced practice provider, you gain a long-term partner in health. Find a Primary Care Provider  Learn more about [facility]'s in-office and virtual care options: [facility]

## 2022-09-11 ENCOUNTER — Ambulatory Visit: Payer: BC Managed Care – PPO | Admitting: Family Medicine

## 2022-09-12 ENCOUNTER — Ambulatory Visit: Payer: BC Managed Care – PPO | Admitting: Family

## 2022-10-14 ENCOUNTER — Telehealth: Payer: BC Managed Care – PPO | Admitting: Nurse Practitioner

## 2022-10-14 DIAGNOSIS — J019 Acute sinusitis, unspecified: Secondary | ICD-10-CM | POA: Diagnosis not present

## 2022-10-14 DIAGNOSIS — B9689 Other specified bacterial agents as the cause of diseases classified elsewhere: Secondary | ICD-10-CM

## 2022-10-14 MED ORDER — FLUCONAZOLE 150 MG PO TABS: ORAL_TABLET | ORAL | 0 refills | Status: DC

## 2022-10-14 MED ORDER — DOXYCYCLINE HYCLATE 100 MG PO TABS: 100.0000 mg | ORAL_TABLET | Freq: Two times a day (BID) | ORAL | 0 refills | Status: DC

## 2022-10-14 NOTE — Progress Notes (Signed)
Virtual Visit Consent   Joanne Kim, you are scheduled for a virtual visit with a [facility] provider today. Just as with appointments in the office, your consent must be obtained to participate. Your consent will be active for this visit and any virtual visit you may have with one of our providers in the next 365 days. If you have a MyChart account, a copy of this consent can be sent to you electronically.  As this is a virtual visit, video technology does not allow for your provider to perform a traditional examination. This may limit your provider's ability to fully assess your condition. If your provider identifies any concerns that need to be evaluated in person or the need to arrange testing (such as labs, EKG, etc.), we will make arrangements to do so. Although advances in technology are sophisticated, we cannot ensure that it will always work on either your end or our end. If the connection with a video visit is poor, the visit may have to be switched to a telephone visit. With either a video or telephone visit, we are not always able to ensure that we have a secure connection.  By engaging in this virtual visit, you consent to the provision of healthcare and authorize for your insurance to be billed (if applicable) for the services provided during this visit. Depending on your insurance coverage, you may receive a charge related to this service.  I need to obtain your verbal consent now. Are you willing to proceed with your visit today? MARIAME Kim has provided verbal consent on 10/14/2022 for a virtual visit (video or telephone). Gildardo Pounds, NP  Date: 10/14/2022 10:38 AM  Virtual Visit via Video Note   I, Gildardo Pounds, connected with  Joanne Kim  ([id number], September 16, 1961) on 10/14/22 at 10:30 AM EST by a video-enabled telemedicine application and verified that I am speaking with the correct person using two identifiers.  Location: Patient: Virtual Visit  Location Patient: Home Provider: Virtual Visit Location Provider: Home Office   I discussed the limitations of evaluation and management by telemedicine and the availability of in person appointments. The patient expressed understanding and agreed to proceed.    History of Present Illness: Joanne Kim is a 62 y.o. who identifies as a female who was assigned female at birth, and is being seen today for sinus symptoms.   Endorses re occurring symptoms of sinus infection. States she has a history of frequent infections and has not been evaluated by an ENT specialist. Current symptoms include:Sinus pressure (frontal and maxillary), dental pain, ear pressure, Post nasal drip and congestion. COVID test negative. Symptoms onset a few days ago with worsening.    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 Repeat in 3 days  if needed., Disp: 2 tablet, Rfl: 0   levocetirizine (XYZAL) 5 MG tablet, Take 1 tablet (5 mg total) by mouth every evening., Disp: 30 tablet, Rfl: 5   montelukast (SINGULAIR) 10 MG tablet, Take 1 tablet (10 mg total) by mouth at bedtime. Use as needed for allergies, Disp: 90 tablet, Rfl: 3   predniSONE (STERAPRED UNI-PAK 21 TAB) 10 MG (21) TBPK tablet, Take  following package directions, Disp: 21 tablet, Rfl: 0   Progesterone Micronized 10 % CREA, Place 4 Pump onto the skin at bedtime., Disp: , Rfl:    Testosterone Compounding Kit 20 % CREA, Apply topically., Disp: , Rfl:   Observations/Objective: Patient is well-developed, well-nourished in no acute distress.  Resting comfortably  at home.  Head is normocephalic, atraumatic.  No labored breathing. Speech is clear and coherent with logical content.  Patient is alert and oriented at baseline.    Assessment and Plan: 1. Acute bacterial sinusitis - doxycycline (VIBRA-TABS) 100 MG tablet; Take 1 tablet (100 mg total) by mouth 2 (two) times daily.  Dispense: 20 tablet; Refill: 0 NEEDS ENT REFERRAL for frequent sinus infection Scheduled with PCP   Follow Up Instructions: I discussed the assessment and treatment plan with the patient. The patient was provided an opportunity to ask questions and all were answered. The patient agreed with the plan and demonstrated an understanding of the instructions.  A copy of instructions were sent to the patient via MyChart unless otherwise noted below.    The patient was advised to call back or seek an in-person evaluation if the symptoms worsen or if the condition fails to improve as anticipated.  Time:  I spent 11 minutes with the patient via telehealth technology discussing the above problems/concerns.    Gildardo Pounds, NP

## 2022-10-14 NOTE — Patient Instructions (Signed)
  Joanne Kim, thank you for joining Joanne Pounds, NP for today's virtual visit.  While this provider is not your primary care provider (PCP), if your PCP is located in our provider database this encounter information will be shared with them immediately following your visit.   [facility] account gives you access to today's visit and all your visits, tests, and labs performed at [facility] " click here if you don't have a [facility] account or go to [email or web address]  Consent: (Patient) Joanne Kim provided verbal consent for this virtual visit at the beginning of the encounter.  Current Medications:  Current Outpatient Medications:    doxycycline (VIBRA-TABS) 100 MG tablet, Take 1 tablet (100 mg total) by mouth 2 (two) times daily., Disp: 20 tablet, Rfl: 0   fluconazole (DIFLUCAN) 150 MG tablet, Take 1 tablet PO once. Repeat in 3 days if needed., Disp: 2 tablet, Rfl: 0   levocetirizine (XYZAL) 5 MG tablet, Take 1 tablet (5 mg total) by mouth every evening., Disp: 30 tablet, Rfl: 5   montelukast (SINGULAIR) 10 MG tablet, Take 1 tablet (10 mg total) by mouth at bedtime. Use as needed for allergies, Disp: 90 tablet, Rfl: 3   predniSONE (STERAPRED UNI-PAK 21 TAB) 10 MG (21) TBPK tablet, Take following package directions, Disp: 21 tablet, Rfl: 0   Progesterone Micronized 10 % CREA, Place 4 Pump onto the skin at bedtime., Disp: , Rfl:    Testosterone Compounding Kit 20 % CREA, Apply topically., Disp: , Rfl:    Medications ordered in this encounter:  Meds ordered this encounter  Medications   doxycycline (VIBRA-TABS) 100 MG tablet    Sig: Take 1 tablet (100 mg total) by mouth 2 (two) times daily.    Dispense:  20 tablet    Refill:  0    Order Specific Question:   Supervising Provider    Answer:   Joanne Kim [[id number]]   fluconazole (DIFLUCAN) 150 MG tablet    Sig: Take 1 tablet PO once. Repeat in 3 days if needed.    Dispense:   2 tablet    Refill:  0    Order Specific Question:   Supervising Provider    Answer:   Joanne Kim [id number]     *If you need refills on other medications prior to your next appointment, please contact your pharmacy*  Follow-Up: Call back or seek an in-person evaluation if the symptoms worsen or if the condition fails to improve as anticipated.  Joanne Kim [phone]  Other Instructions NEEDS ENT REFERRAL for frequent sinus infection Scheduled with PCP    If you have been instructed to have an in-person evaluation today at a local Urgent Care facility, please use the link below. It will take you to a list of all of our available [facility], including address, phone number and hours of operation. Please do not delay care.  [facility]  If you or a family member do not have a primary care provider, use the link below to schedule a visit and establish care. When you choose a [facility] primary care physician or advanced practice provider, you gain a long-term partner in health. Find a Primary Care Provider  Learn more about [facility]'s in-office and virtual care options: [facility] Now

## 2022-10-17 NOTE — Progress Notes (Unsigned)
[place] at [facility] [address] [place], [place] [address] [phone]  Date:  10/19/2022   Name:  Joanne Kim   DOB:  Feb 18, 1961   MRN:  [id number]  PCP:  [name], MD    Chief Complaint: No chief complaint on file.   History of Present Illness:  Joanne Kim is a 62 y.o. very pleasant female patient who presents with the following:  Patient seen today with concern about frequent sinusitis Pt is seen virtually Her location is her workplace, my location is office Pt ID is confirmed with 2 factors, she gives consent for a virtual visit today  The pt and myself are present on the call today   Most recent visit with myself was in May-at that time she had been told her hemoglobin was significantly low when she went to donate blood.  However, her CBC and also ferritin were normal upon recheck-we suspect the machine at the [facility] may have been incorrect History of sleep apnea obesity, glaucoma  Flu vaccine Pap smear Recommend COVID booster Shingrix  She was seen in the ER on 1/2 with back pain- she was treated for a UTI with macrobid but I don't think a culture was done She notes her lower back still hurts  She then did a virtual last week on 1/6 and was started on doxycycline for possible sinusitis   She did test for covid over the weekend and was negative  No cough No ST Her sx are mostly in her sinuses at this time -her sinuses just feel hot, congested, uncomfortable  She notes that steroids have worked in the past   Patient Active Problem List   Diagnosis Date Noted   Tick bite of left thigh 03/30/2022   Moderate obstructive sleep apnea-hypopnea syndrome 10/26/2021   Intermittent sleepiness 10/26/2021   Non-restorative sleep 10/26/2021   Postviral fatigue syndrome 10/26/2021   Aphakic open-angle glaucoma, 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   Dry eyes 10/23/2016   Obesity 10/23/2016   RECTAL  BLEEDING 12/14/2009    Past Medical History:  Diagnosis Date   Allergy    Elevated blood pressure reading    History of colon polyps    Vaginal Pap smear, abnormal 2017    Past Surgical History:  Procedure Laterality Date   BREAST BIOPSY     BUNIONECTOMY     COLONOSCOPY  2011    Social History   Tobacco Use   Smoking status: Never   Smokeless tobacco: Never  Substance Use Topics   Alcohol use: Yes    Comment: occasionally   Drug use: No    Family History  Problem Relation Age of Onset   Heart disease Other    Stroke Other    Hypertension Other    Breast cancer Maternal Aunt    Sarcoidosis Sister    Allergic rhinitis Sister    Asthma Son    Allergic rhinitis Son    Urticaria Son    Colon polyps Mother    Allergic rhinitis Grandchild    Eczema Grandchild    Urticaria Niece    Colon cancer Neg Hx    Esophageal cancer Neg Hx    Rectal cancer Neg Hx    Stomach cancer Neg Hx    Angioedema Neg Hx    Immunodeficiency Neg Hx     Allergies  Allergen Reactions   Augmentin [Amoxicillin-Pot Clavulanate]     rash   Oxycodone Itching  Medication list has been reviewed and updated.  Current Outpatient Medications on File Prior to Visit  Medication Sig Dispense Refill   doxycycline (VIBRA-TABS) 100 MG tablet Take 1 tablet (100 mg total) by mouth 2 (two) times daily. 20 tablet 0   fluconazole (DIFLUCAN) 150 MG tablet Take 1 tablet PO once. Repeat in 3 days if needed. 2 tablet 0   levocetirizine (XYZAL) 5 MG tablet Take 1 tablet (5 mg total) by mouth every evening. 30 tablet 5   montelukast (SINGULAIR) 10 MG tablet Take 1 tablet (10 mg total) by mouth at bedtime. Use as needed for allergies 90 tablet 3   predniSONE (STERAPRED UNI-PAK 21 TAB) 10 MG (21) TBPK tablet Take following package directions 21 tablet 0   Progesterone Micronized 10 % CREA Place 4 Pump onto the skin at bedtime.     Testosterone Compounding Kit 20 % CREA Apply topically.     No current  facility-administered medications on file prior to visit.    Review of Systems:  As per HPI- otherwise negative.   Physical Examination: There were no vitals filed for this visit. There were no vitals filed for this visit. There is no height or weight on file to calculate BMI. Ideal Body Weight:   Patient observed via video monitor.  She looks well, no acute distress or shortness of breath is noted   Assessment and Plan: Chronic frontal sinusitis - Plan: predniSONE (DELTASONE) 20 MG tablet, DG Sinus 1-2 Views  Acute cystitis without hematuria - Plan: Urine Culture  Virtual visit today for concern of sinusitis and possible recent UTI.  She is already on antibiotics.  However, she is still bothered by sinus discomfort.  I added on prednisone to take for 6 days.  Also ordered sinus films.  Ordered a urine culture, she plans to come in tomorrow to drop off a urine sample and get her sinus films  Signed Lamar Blinks, MD

## 2022-10-19 ENCOUNTER — Telehealth (INDEPENDENT_AMBULATORY_CARE_PROVIDER_SITE_OTHER): Payer: BC Managed Care – PPO | Admitting: Family Medicine

## 2022-10-19 DIAGNOSIS — N3000 Acute cystitis without hematuria: Secondary | ICD-10-CM

## 2022-10-19 DIAGNOSIS — J321 Chronic frontal sinusitis: Secondary | ICD-10-CM | POA: Diagnosis not present

## 2022-10-19 MED ORDER — PREDNISONE 20 MG PO TABS: ORAL_TABLET | ORAL | 0 refills | Status: DC

## 2022-10-20 ENCOUNTER — Other Ambulatory Visit: Payer: BC Managed Care – PPO

## 2022-10-20 ENCOUNTER — Ambulatory Visit (HOSPITAL_BASED_OUTPATIENT_CLINIC_OR_DEPARTMENT_OTHER)
Admission: RE | Admit: 2022-10-20 | Discharge: 2022-10-20 | Disposition: A | Discharge status: Home / Self Care | Payer: BC Managed Care – PPO | Source: Ambulatory Visit | Attending: Family Medicine | Admitting: Family Medicine

## 2022-10-20 ENCOUNTER — Encounter: Payer: Self-pay | Admitting: Family Medicine

## 2022-10-20 DIAGNOSIS — J321 Chronic frontal sinusitis: Secondary | ICD-10-CM | POA: Diagnosis present

## 2022-10-20 DIAGNOSIS — N3000 Acute cystitis without hematuria: Secondary | ICD-10-CM

## 2022-10-21 LAB — URINE CULTURE
MICRO NUMBER:: 14424665
SPECIMEN QUALITY:: ADEQUATE

## 2022-10-22 ENCOUNTER — Encounter: Payer: Self-pay | Admitting: Family Medicine

## 2022-11-04 NOTE — Progress Notes (Unsigned)
[place] at [facility] [address] [place], [place] [address] [phone]  Date:  11/06/2022   Name:  Joanne Kim   DOB:  09/23/1961   MRN:  [id number]  PCP:  [name], MD    Chief Complaint: No chief complaint on file.   History of Present Illness:  Joanne Kim is a 62 y.o. very pleasant female patient who presents with the following:  Pt seen today to follow-up from recent ER visit  She was seen at [facility] ER on 1/23 with intermittent numbness left arm as well as CP and HTN Dx with chest wall pain and released to home  Trop negative   Last seen by myself virtually earlier this month for a sinus infection  Patient Active Problem List   Diagnosis Date Noted   Tick bite of left thigh 03/30/2022   Moderate obstructive sleep apnea-hypopnea syndrome 10/26/2021   Intermittent sleepiness 10/26/2021   Non-restorative sleep 10/26/2021   Postviral fatigue syndrome 10/26/2021   Aphakic open-angle glaucoma, 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   Dry eyes 10/23/2016   Obesity 10/23/2016   RECTAL BLEEDING 12/14/2009    Past Medical History:  Diagnosis Date   Allergy    Elevated blood pressure reading    History of colon polyps    Vaginal Pap smear, abnormal 2017    Past Surgical History:  Procedure Laterality Date   BREAST BIOPSY     BUNIONECTOMY     COLONOSCOPY  2011    Social History   Tobacco Use   Smoking status: Never   Smokeless tobacco: Never  Substance Use Topics   Alcohol use: Yes    Comment: occasionally   Drug use: No    Family History  Problem Relation Age of Onset   Heart disease Other    Stroke Other    Hypertension Other    Breast cancer Maternal Aunt    Sarcoidosis Sister    Allergic rhinitis Sister    Asthma Son    Allergic rhinitis Son    Urticaria Son    Colon polyps Mother    Allergic rhinitis Grandchild    Eczema Grandchild    Urticaria Niece    Colon cancer Neg Hx     Esophageal cancer Neg Hx    Rectal cancer Neg Hx    Stomach cancer Neg Hx    Angioedema Neg Hx    Immunodeficiency Neg Hx     Allergies  Allergen Reactions   Augmentin [Amoxicillin-Pot Clavulanate]     rash   Oxycodone Itching    Medication list has been reviewed and updated.  Current Outpatient Medications on File Prior to Visit  Medication Sig Dispense Refill   doxycycline (VIBRA-TABS) 100 MG tablet Take 1 tablet (100 mg total) by mouth 2 (two) times daily. 20 tablet 0   fluconazole (DIFLUCAN) 150 MG tablet Take 1 tablet PO once. Repeat in 3 days if needed. 2 tablet 0   levocetirizine (XYZAL) 5 MG tablet Take 1 tablet (5 mg total) by mouth every evening. 30 tablet 5   montelukast (SINGULAIR) 10 MG tablet Take 1 tablet (10 mg total) by mouth at bedtime. Use as needed for allergies 90 tablet 3   predniSONE (DELTASONE) 20 MG tablet Take 40 mg by mouth daily for 3 days, then 20 mg daily for 3 days 9 tablet 0   Progesterone Micronized 10 % CREA Place 4 Pump onto the skin at bedtime.  Testosterone Compounding Kit 20 % CREA Apply topically.     No current facility-administered medications on file prior to visit.    Review of Systems:  As per HPI- otherwise negative.   Physical Examination: There were no vitals filed for this visit. There were no vitals filed for this visit. There is no height or weight on file to calculate BMI. Ideal Body Weight:    GEN: no acute distress. HEENT: Atraumatic, Normocephalic.  Ears and Nose: No external deformity. CV: RRR, No M/G/R. No JVD. No thrill. No extra heart sounds. PULM: CTA B, no wheezes, crackles, rhonchi. No retractions. No resp. distress. No accessory muscle use. ABD: S, NT, ND, +BS. No rebound. No HSM. EXTR: No c/c/e PSYCH: Normally interactive. Conversant.    Assessment and Plan: ***  Signed Lamar Blinks, MD

## 2022-11-04 NOTE — Patient Instructions (Incomplete)
Good to see you again today!  I will be in touch with your labs asap Please do give the neurology office a call and set up a follow-up visit to talk about your sleep results- if you have untreated sleep apnea that will cause fatigue!   [facility]  Address: [address], [place], [place] [address] Phone: [phone]

## 2022-11-06 ENCOUNTER — Encounter: Payer: Self-pay | Admitting: Family Medicine

## 2022-11-06 ENCOUNTER — Ambulatory Visit: Payer: BC Managed Care – PPO | Admitting: Family Medicine

## 2022-11-06 VITALS — BP 128/76 | HR 90 | Temp 98.0°F | Resp 18 | Ht 69.0 in | Wt 232.8 lb

## 2022-11-06 DIAGNOSIS — Z131 Encounter for screening for diabetes mellitus: Secondary | ICD-10-CM | POA: Diagnosis not present

## 2022-11-06 DIAGNOSIS — R5383 Other fatigue: Secondary | ICD-10-CM

## 2022-11-06 DIAGNOSIS — G4733 Obstructive sleep apnea (adult) (pediatric): Secondary | ICD-10-CM | POA: Diagnosis not present

## 2022-11-06 DIAGNOSIS — R5381 Other malaise: Secondary | ICD-10-CM

## 2022-11-06 LAB — POCT INFLUENZA A/B
Influenza A, POC: NEGATIVE
Influenza B, POC: NEGATIVE

## 2022-11-06 LAB — POC COVID19 BINAXNOW: SARS Coronavirus 2 Ag: NEGATIVE

## 2022-11-07 ENCOUNTER — Other Ambulatory Visit (HOSPITAL_BASED_OUTPATIENT_CLINIC_OR_DEPARTMENT_OTHER): Payer: Self-pay | Admitting: Family Medicine

## 2022-11-07 ENCOUNTER — Encounter: Payer: Self-pay | Admitting: Family Medicine

## 2022-11-07 DIAGNOSIS — Z1231 Encounter for screening mammogram for malignant neoplasm of breast: Secondary | ICD-10-CM

## 2022-11-07 LAB — VITAMIN B12: Vitamin B-12: >1500 pg/mL — ABNORMAL HIGH (ref 211–911)

## 2022-11-07 LAB — VITAMIN D 25 HYDROXY (VIT D DEFICIENCY, FRACTURES): VITD: 24.1 ng/mL — ABNORMAL LOW (ref 30.00–100.00)

## 2022-11-07 LAB — TSH: TSH: 1.11 u[IU]/mL (ref 0.35–5.50)

## 2022-11-07 LAB — HEMOGLOBIN A1C: Hgb A1c MFr Bld: 5.7 % (ref 4.6–6.5)

## 2022-11-20 ENCOUNTER — Inpatient Hospital Stay (HOSPITAL_BASED_OUTPATIENT_CLINIC_OR_DEPARTMENT_OTHER): Admission: RE | Admit: 2022-11-20 | Payer: BC Managed Care – PPO | Source: Ambulatory Visit

## 2022-11-20 ENCOUNTER — Encounter (HOSPITAL_BASED_OUTPATIENT_CLINIC_OR_DEPARTMENT_OTHER): Payer: Self-pay

## 2023-01-10 ENCOUNTER — Ambulatory Visit: Payer: BC Managed Care – PPO | Admitting: Family Medicine

## 2023-01-11 ENCOUNTER — Encounter: Payer: Self-pay | Admitting: Family Medicine

## 2023-01-11 ENCOUNTER — Other Ambulatory Visit (HOSPITAL_COMMUNITY)
Admission: RE | Admit: 2023-01-11 | Discharge: 2023-01-11 | Disposition: A | Discharge status: Home / Self Care | Payer: BC Managed Care – PPO | Source: Ambulatory Visit | Attending: Family Medicine | Admitting: Family Medicine

## 2023-01-11 ENCOUNTER — Other Ambulatory Visit (HOSPITAL_BASED_OUTPATIENT_CLINIC_OR_DEPARTMENT_OTHER): Payer: Self-pay

## 2023-01-11 ENCOUNTER — Ambulatory Visit: Payer: BC Managed Care – PPO | Admitting: Family Medicine

## 2023-01-11 VITALS — BP 122/80 | HR 84 | Temp 97.8°F | Resp 18 | Ht 69.0 in

## 2023-01-11 DIAGNOSIS — N898 Other specified noninflammatory disorders of vagina: Secondary | ICD-10-CM | POA: Insufficient documentation

## 2023-01-11 DIAGNOSIS — R400 Somnolence: Secondary | ICD-10-CM | POA: Diagnosis not present

## 2023-01-11 DIAGNOSIS — A599 Trichomoniasis, unspecified: Secondary | ICD-10-CM | POA: Diagnosis not present

## 2023-01-11 DIAGNOSIS — E669 Obesity, unspecified: Secondary | ICD-10-CM

## 2023-01-11 DIAGNOSIS — Z124 Encounter for screening for malignant neoplasm of cervix: Secondary | ICD-10-CM

## 2023-01-11 DIAGNOSIS — R110 Nausea: Secondary | ICD-10-CM

## 2023-01-11 MED ORDER — FLUCONAZOLE 150 MG PO TABS
150.0000 mg | ORAL_TABLET | Freq: Once | ORAL | 0 refills | Status: AC
  Filled 2023-01-11: qty 2, 2d supply, fill #0

## 2023-01-11 NOTE — Patient Instructions (Addendum)
Good to see you again today- I will be in touch with your labs asap We will start with a diflucan pill for possible yeast infection  Colon due in in 2032  02/16/2021 MRN: [id number] Almond Lint Dear Ms. Wynetta Emery, I am writing to inform you that the polyp removed from your colon was NOT pre-cancerous, but instead were benign Hyperplastic Polyps. These polyps are NOT precancerous and harbor no malignant potential.  I recommend that you have a repeat colonoscopy exam in 10 years for routine colorectal screening.   I will look into your sleep study referral Please check and see if your insurance will cover GLP-1 agonist drugs for weight loss

## 2023-01-11 NOTE — Progress Notes (Addendum)
[place] at [facility] [address] [place], [place] [address] [phone]  Date:  01/11/2023   Name:  Joanne Kim   DOB:  Apr 02, 1961   MRN:  [id number]  PCP:  [name], MD    Chief Complaint: vaginal discomfort (Discharge and Odor. As well as some irritation along the panty line./Concerns/ questions: see what happened to the referral for sleep apnea. 2. Discuss weight loss options again. )   History of Present Illness:  Joanne Kim is a 62 y.o. very pleasant female patient who presents with the following:  History of OSA, glaucoma Seen today with concern of irritation in her groin/ panty line area She notes some burning feeling and maybe irriation from her underwear  She also notes some vaginal discomfort and occasional pain, odor, the discharge is clear   She did not get a call regarding referral for sleep testing- she continues to feel tired so she would like to go ahead with a sleep test She may snore some but her sleep app does not indicate she snores much, and her partner indicates she does not snore much   She has noted some difficulty in weight loss  She has not yet checked into a GLP-1 drug coverage with her insurance.  She does not have diabetes If GLP-1 medication is not covered she would consider looking into weight loss surgery  No vaginal bleeding Last pap in 2020  No contra for a GLP-1   Lab Results  Component Value Date   HGBA1C 5.7 11/06/2022     Patient Active Problem List   Diagnosis Date Noted   Tick bite of left thigh 03/30/2022   Moderate obstructive sleep apnea-hypopnea syndrome 10/26/2021   Intermittent sleepiness 10/26/2021   Non-restorative sleep 10/26/2021   Postviral fatigue syndrome 10/26/2021   Aphakic open-angle glaucoma, 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   Dry eyes 10/23/2016   Obesity 10/23/2016   RECTAL BLEEDING 12/14/2009    Past Medical History:  Diagnosis Date    Allergy    Elevated blood pressure reading    History of colon polyps    Vaginal Pap smear, abnormal 2017    Past Surgical History:  Procedure Laterality Date   BREAST BIOPSY     BUNIONECTOMY     COLONOSCOPY  2011    Social History   Tobacco Use   Smoking status: Never   Smokeless tobacco: Never  Substance Use Topics   Alcohol use: Yes    Comment: occasionally   Drug use: No    Family History  Problem Relation Age of Onset   Heart disease Other    Stroke Other    Hypertension Other    Breast cancer Maternal Aunt    Sarcoidosis Sister    Allergic rhinitis Sister    Asthma Son    Allergic rhinitis Son    Urticaria Son    Colon polyps Mother    Allergic rhinitis Grandchild    Eczema Grandchild    Urticaria Niece    Colon cancer Neg Hx    Esophageal cancer Neg Hx    Rectal cancer Neg Hx    Stomach cancer Neg Hx    Angioedema Neg Hx    Immunodeficiency Neg Hx     Allergies  Allergen Reactions   Augmentin [Amoxicillin-Pot Clavulanate]     rash   Oxycodone Itching    Medication list has been reviewed and updated.  Current Outpatient Medications on File  Prior to Visit  Medication Sig Dispense Refill   levocetirizine (XYZAL) 5 MG tablet Take 1 tablet (5 mg total) by mouth every evening. 30 tablet 5   montelukast (SINGULAIR) 10 MG tablet Take 1 tablet (10 mg total) by mouth at bedtime. Use as needed for allergies 90 tablet 3   predniSONE (DELTASONE) 20 MG tablet Take 40 mg by mouth daily for 3 days, then 20 mg daily for 3 days (Patient not taking: Reported on 11/06/2022) 9 tablet 0   Progesterone Micronized 10 % CREA Place 4 Pump onto the skin at bedtime. (Patient not taking: Reported on 11/06/2022)     Testosterone Compounding Kit 20 % CREA Apply topically. (Patient not taking: Reported on 11/06/2022)     No current facility-administered medications on file prior to visit.    Review of Systems:  As per HPI- otherwise negative.   Physical  Examination: Vitals:   01/11/23 1402  BP: 122/80  Pulse: 84  Resp: 18  Temp: 97.8 F (36.6 C)  SpO2: 99%   Vitals:   01/11/23 1402  Height: 5\' 9"  (1.753 m)   Body mass index is 34.38 kg/m. Ideal Body Weight: Weight in (lb) to have BMI = 25: 168.9  GEN: no acute distress.  Obese, looks well HEENT: Atraumatic, Normocephalic.  Ears and Nose: No external deformity. CV: RRR, No M/G/R. No JVD. No thrill. No extra heart sounds. PULM: CTA B, no wheezes, crackles, rhonchi. No retractions. No resp. distress. No accessory muscle use. ABD: S, NT, ND, +BS. No rebound. No HSM. EXTR: No c/c/e PSYCH: Normally interactive. Conversant.  Pap and vaginal swab collected -mild vaginal erythema is present, consider yeast vaginitis.  Otherwise exam is normal.  No masses or tenderness   Assessment and Plan: Vaginal itching - Plan: Cervicovaginal ancillary only( Vesper), fluconazole (DIFLUCAN) 150 MG tablet  Screening for cervical cancer - Plan: Cytology - PAP  Somnolence - Plan: Ambulatory referral to Neurology  Obesity (BMI 35.0-39.9 without comorbidity)  Patient seen today with concern of vaginal itching.  Await testing as above, will treat with Diflucan for now. Referral back to neurology for sleep study Discussed weight loss treatments.  She will check into GLP-1 drug coverage with her insurance company Signed Abbe [place], MD  Addnd 4/8- received vaginal swab- called pt to go over her results Let her know she does have trichomonas vaginalis, I called in Flagyl for her.  Also advised her partner will need to be treated prior to sexual contact to avoid passing this infection back-and-forth.  Patient states understanding.  Also, she notes vomiting and diarrhea for about 12 hours.  She is not sure if she ate something bad or has a virus.  No fever or particular abdominal pain.  At this point diarrhea seems to be better, but she is still nauseated.  I sent in some Zofran for her as well.   Advised her to hold off on taking Flagyl until she is feeling better.  I advised her if she is not feeling better within the next day or so please let us know.  If any severe symptoms or distress please seek care right away  Neisseria Gonorrhea Negative  Chlamydia Negative  Trichomonas Positive Abnormal   Bacterial Vaginitis (gardnerella) Negative  Candida Vaginitis Negative  Candida Glabrata Negative  Comment Normal Reference Range Bacterial Vaginosis - Negative  Comment Normal Reference Range Candida Species - Negative  Comment Normal Reference Range Candida Galbrata - Negative  Comment Normal Reference Range Trichomonas -  Negative  Comment Normal Reference Ranger Chlamydia - Negative  Comment Normal Reference Range Neisseria Gonorrhea - Negative    Received her pap 4/9-   High risk HPV Negative  Adequacy Satisfactory but limited for evaluation with partially obscuring  Adequacy inflammation; transformation zone component present.  Diagnosis - Atypical squamous cells of undetermined significance (ASC-US) Abnormal   Microorganisms Trichomonas vaginalis present  Comment Normal Reference Range HPV - Negative

## 2023-01-12 ENCOUNTER — Encounter: Payer: Self-pay | Admitting: Family Medicine

## 2023-01-12 DIAGNOSIS — E669 Obesity, unspecified: Secondary | ICD-10-CM

## 2023-01-12 MED ORDER — ZEPBOUND 2.5 MG/0.5ML ~~LOC~~ SOAJ: 2.5000 mg | SUBCUTANEOUS | 2 refills | Status: DC

## 2023-01-15 ENCOUNTER — Telehealth: Payer: Self-pay

## 2023-01-15 LAB — CERVICOVAGINAL ANCILLARY ONLY
Bacterial Vaginitis (gardnerella): NEGATIVE
Candida Glabrata: NEGATIVE
Candida Vaginitis: NEGATIVE
Chlamydia: NEGATIVE
Comment: NEGATIVE
Comment: NEGATIVE
Comment: NEGATIVE
Comment: NEGATIVE
Comment: NEGATIVE
Comment: NORMAL
Neisseria Gonorrhea: NEGATIVE
Trichomonas: POSITIVE — AB

## 2023-01-15 MED ORDER — ONDANSETRON HCL 4 MG PO TABS: 4.0000 mg | ORAL_TABLET | Freq: Three times a day (TID) | ORAL | 0 refills | Status: AC | PRN

## 2023-01-15 MED ORDER — METRONIDAZOLE 500 MG PO TABS: 500.0000 mg | ORAL_TABLET | Freq: Two times a day (BID) | ORAL | 0 refills | Status: DC

## 2023-01-15 NOTE — Telephone Encounter (Signed)
PA approved.   Request Reference Number: [id number]. ZEPBOUND INJ 2.5MG  is approved through 07/17/2023. Please refer to the fax or electronic case notice for further information. Authorization Expiration Date: 07/17/2023

## 2023-01-15 NOTE — Addendum Note (Signed)
Addended by: Abbe Amsterdam C on: 01/15/2023 01:31 PM   Modules accepted: Orders

## 2023-01-15 NOTE — Telephone Encounter (Signed)
PA initiated via Covermymeds; KEY: [id number]. Awaiting determination.

## 2023-01-16 ENCOUNTER — Other Ambulatory Visit: Payer: Self-pay | Admitting: Family Medicine

## 2023-01-16 ENCOUNTER — Encounter: Payer: Self-pay | Admitting: Family Medicine

## 2023-01-16 DIAGNOSIS — E669 Obesity, unspecified: Secondary | ICD-10-CM

## 2023-01-16 LAB — CYTOLOGY - PAP
Comment: NEGATIVE
Diagnosis: UNDETERMINED — AB
High risk HPV: NEGATIVE

## 2023-02-12 ENCOUNTER — Encounter: Payer: Self-pay | Admitting: Family Medicine

## 2023-02-12 DIAGNOSIS — A599 Trichomoniasis, unspecified: Secondary | ICD-10-CM

## 2023-02-13 MED ORDER — ZEPBOUND 5 MG/0.5ML ~~LOC~~ SOAJ: 5.0000 mg | SUBCUTANEOUS | 1 refills | Status: DC

## 2023-02-14 MED ORDER — METRONIDAZOLE 500 MG PO TABS: 500.0000 mg | ORAL_TABLET | Freq: Two times a day (BID) | ORAL | 0 refills | Status: DC

## 2023-02-14 NOTE — Addendum Note (Signed)
Addended by: Abbe Amsterdam C on: 02/14/2023 12:39 PM   Modules accepted: Orders

## 2023-03-30 ENCOUNTER — Encounter: Payer: Self-pay | Admitting: Family Medicine

## 2023-03-30 ENCOUNTER — Telehealth: Payer: BC Managed Care – PPO | Admitting: Nurse Practitioner

## 2023-03-30 DIAGNOSIS — J0140 Acute pansinusitis, unspecified: Secondary | ICD-10-CM

## 2023-03-30 DIAGNOSIS — J309 Allergic rhinitis, unspecified: Secondary | ICD-10-CM | POA: Diagnosis not present

## 2023-03-30 MED ORDER — ZEPBOUND 5 MG/0.5ML ~~LOC~~ SOAJ: 5.0000 mg | SUBCUTANEOUS | 1 refills | Status: DC

## 2023-03-30 MED ORDER — DOXYCYCLINE HYCLATE 100 MG PO TABS
100.0000 mg | ORAL_TABLET | Freq: Two times a day (BID) | ORAL | 0 refills | Status: AC
Start: 2023-03-30 — End: 2023-04-09

## 2023-03-30 MED ORDER — LEVOCETIRIZINE DIHYDROCHLORIDE 5 MG PO TABS
5.0000 mg | ORAL_TABLET | Freq: Every evening | ORAL | 2 refills | Status: DC
Start: 2023-03-30 — End: 2024-05-29

## 2023-03-30 MED ORDER — IPRATROPIUM BROMIDE 0.03 % NA SOLN
2.0000 | Freq: Two times a day (BID) | NASAL | 12 refills | Status: AC
Start: 2023-03-30 — End: ?

## 2023-03-30 NOTE — Progress Notes (Signed)
Virtual Visit Consent   Joanne Kim, you are scheduled for a virtual visit with a [facility] provider today. Just as with appointments in the office, your consent must be obtained to participate. Your consent will be active for this visit and any virtual visit you may have with one of our providers in the next 365 days. If you have a MyChart account, a copy of this consent can be sent to you electronically.  As this is a virtual visit, video technology does not allow for your provider to perform a traditional examination. This may limit your provider's ability to fully assess your condition. If your provider identifies any concerns that need to be evaluated in person or the need to arrange testing (such as labs, EKG, etc.), we will make arrangements to do so. Although advances in technology are sophisticated, we cannot ensure that it will always work on either your end or our end. If the connection with a video visit is poor, the visit may have to be switched to a telephone visit. With either a video or telephone visit, we are not always able to ensure that we have a secure connection.  By engaging in this virtual visit, you consent to the provision of healthcare and authorize for your insurance to be billed (if applicable) for the services provided during this visit. Depending on your insurance coverage, you may receive a charge related to this service.  I need to obtain your verbal consent now. Are you willing to proceed with your visit today? Joanne Kim has provided verbal consent on 03/30/2023 for a virtual visit (video or telephone). Viviano Simas, FNP  Date: 03/30/2023 11:00 AM  Virtual Visit via Video Note   I, Viviano Simas, connected with  Joanne Kim  ([id number], April 04, 1961) on 03/30/23 at 11:45 AM EDT by a video-enabled telemedicine application and verified that I am speaking with the correct person using two identifiers.  Location: Patient: Virtual Visit Location  Patient: Home Provider: Virtual Visit Location Provider: Home Office   I discussed the limitations of evaluation and management by telemedicine and the availability of in person appointments. The patient expressed understanding and agreed to proceed.    CC Allergies, nasal discomfort from excessive mucus, achey, sneezing, itchy eyes, head congestion.I think I have the crud!!!! My granddaughters have had really bad allergies, which led to respiratory infections, think I have contracted something!!! Or it's just my turn!!! HELPPPPP! I've been having some land clearing around my home the past few weeks, stirring all types of allergens, so it's very possible!  History of Present Illness: Joanne Kim is a 62 y.o. who identifies as a female who was assigned female at birth, and is being seen today for nasal congestion and worsening allergies that were triggered by recent yard work. She has also been exposed to URIs from her grandchildren.  She has a scratchy throat, nasal congestion, overall doesn't feel well She has pressure in her ethmoid regions  She has been having progressively worsening symptoms this week  Denies a cough   She has been taking Vitamin C  She uses a nasal spray as needed Azelastine She is out of her Xyzal  She is not taking the Singulair   Her most recent sinus infection 10/2022  Problems:  Patient Active Problem List   Diagnosis Date Noted   Tick bite of left thigh 03/30/2022   Moderate obstructive sleep apnea-hypopnea syndrome 10/26/2021   Intermittent sleepiness 10/26/2021   Non-restorative sleep 10/26/2021  Postviral fatigue syndrome 10/26/2021   Aphakic open-angle glaucoma, 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   Dry eyes 10/23/2016   Obesity 10/23/2016   RECTAL BLEEDING 12/14/2009    Allergies:  Allergies  Allergen Reactions   Augmentin [Amoxicillin-Pot Clavulanate]     rash   Oxycodone Itching   Medications:  Current Outpatient Medications:    levocetirizine  (XYZAL) 5 MG tablet, Take 1 tablet (5 mg total) by mouth every evening., Disp: 30 tablet, Rfl: 5   metroNIDAZOLE (FLAGYL) 500 MG tablet, Take 1 tablet (500 mg total) by mouth 2 (two) times daily. Take 1 pill twice daily for one week. NO alcohol, Disp: 14 tablet, Rfl: 0   montelukast (SINGULAIR) 10 MG tablet, Take 1 tablet (10 mg total) by mouth at bedtime. Use as needed for allergies, Disp: 90 tablet, Rfl: 3   ondansetron (ZOFRAN) 4 MG tablet, Take 1 tablet (4 mg total) by mouth every 8 (eight) hours as needed for nausea or vomiting., Disp: 30 tablet, Rfl: 0   predniSONE (DELTASONE) 20 MG tablet, Take 40 mg by mouth daily for 3 days, then 20 mg daily for 3 days (Patient not taking: Reported on 11/06/2022), Disp: 9 tablet, Rfl: 0   Progesterone Micronized 10 % CREA, Place 4 Pump onto the skin at bedtime. (Patient not taking: Reported on 11/06/2022), Disp: , Rfl:    Testosterone Compounding Kit 20 % CREA, Apply topically. (Patient not taking: Reported on 11/06/2022), Disp: , Rfl:    tirzepatide (ZEPBOUND) 5 MG/0.5ML Pen, Inject 5 mg into the skin once a week., Disp: 2 mL, Rfl: 1  Observations/Objective: Patient is well-developed, well-nourished in no acute distress.  Resting comfortably  at home.  Head is normocephalic, atraumatic.  No labored breathing.  Speech is clear and coherent with logical content.  Patient is alert and oriented at baseline.    Assessment and Plan:  1. Acute non-recurrent pansinusitis  - ipratropium (ATROVENT) 0.03 % nasal spray; Place 2 sprays into both nostrils every 12 (twelve) hours.  Dispense: 30 mL; Refill: 12 - levocetirizine (XYZAL) 5 MG tablet; Take 1 tablet (5 mg total) by mouth every evening.  Dispense: 30 tablet; Refill: 2 - doxycycline (VIBRA-TABS) 100 MG tablet; Take 1 tablet (100 mg total) by mouth 2 (two) times daily for 10 days.  Dispense: 20 tablet; Refill: 0  2. Allergic rhinitis, unspecified seasonality, unspecified trigger  Discussed starting with  allergy regimen for 48 hours if no improvement she will plan to start antibiotics as discussed  - ipratropium (ATROVENT) 0.03 % nasal spray; Place 2 sprays into both nostrils every 12 (twelve) hours.  Dispense: 30 mL; Refill: 12 - levocetirizine (XYZAL) 5 MG tablet; Take 1 tablet (5 mg total) by mouth every evening.  Dispense: 30 tablet; Refill: 2     Follow Up Instructions: I discussed the assessment and treatment plan with the patient. The patient was provided an opportunity to ask questions and all were answered. The patient agreed with the plan and demonstrated an understanding of the instructions.  A copy of instructions were sent to the patient via MyChart unless otherwise noted below.    The patient was advised to call back or seek an in-person evaluation if the symptoms worsen or if the condition fails to improve as anticipated.  Time:  I spent 15 minutes with the patient via telehealth technology discussing the above problems/concerns.    Viviano Simas, FNP

## 2023-04-08 ENCOUNTER — Telehealth: Payer: BC Managed Care – PPO | Admitting: Family Medicine

## 2023-04-08 DIAGNOSIS — J321 Chronic frontal sinusitis: Secondary | ICD-10-CM | POA: Diagnosis not present

## 2023-04-08 DIAGNOSIS — B9689 Other specified bacterial agents as the cause of diseases classified elsewhere: Secondary | ICD-10-CM

## 2023-04-08 DIAGNOSIS — J019 Acute sinusitis, unspecified: Secondary | ICD-10-CM

## 2023-04-08 MED ORDER — PREDNISONE 20 MG PO TABS
ORAL_TABLET | ORAL | 0 refills | Status: DC
Start: 2023-04-08 — End: 2024-02-11

## 2023-04-08 NOTE — Progress Notes (Signed)
E-Visit for Sinus Problems  We are sorry that you are not feeling well.  Here is how we plan to help!  Based on what you have shared with me it looks like you have sinusitis.  Sinusitis is inflammation and infection in the sinus cavities of the head.  Based on your presentation I believe you most likely have Acute Bacterial Sinusitis. Continue antibiotics and add prednisone. You may use an oral decongestant such as Mucinex D or if you have glaucoma or high blood pressure use plain Mucinex. Saline nasal spray help and can safely be used as often as needed for congestion.  If you develop worsening sinus pain, fever or notice severe headache and vision changes, or if symptoms are not better after completion of antibiotic, please schedule an appointment with a health care provider.    Sinus infections are not as easily transmitted as other respiratory infection, however we still recommend that you avoid close contact with loved ones, especially the very young and elderly.  Remember to wash your hands thoroughly throughout the day as this is the number one way to prevent the spread of infection!  Home Care: Only take medications as instructed by your medical team. Complete the entire course of an antibiotic. Do not take these medications with alcohol. A steam or ultrasonic humidifier can help congestion.  You can place a towel over your head and breathe in the steam from hot water coming from a faucet. Avoid close contacts especially the very young and the elderly. Cover your mouth when you cough or sneeze. Always remember to wash your hands.  Get Help Right Away If: You develop worsening fever or sinus pain. You develop a severe head ache or visual changes. Your symptoms persist after you have completed your treatment plan.  Make sure you Understand these instructions. Will watch your condition. Will get help right away if you are not doing well or get worse.  Thank you for choosing an  e-visit.  Your e-visit answers were reviewed by a board certified advanced clinical practitioner to complete your personal care plan. Depending upon the condition, your plan could have included both over the counter or prescription medications.  Please review your pharmacy choice. Make sure the pharmacy is open so you can pick up prescription now. If there is a problem, you may contact your provider through [employer] and have the prescription routed to another pharmacy.  Your safety is important to [place]. If you have drug allergies check your prescription carefully.   For the next 24 hours you can use MyChart to ask questions about today's visit, request a non-urgent call back, or ask for a work or school excuse. You will get an email in the next two days asking about your experience. I hope that your e-visit has been valuable and will speed your recovery.    have provided 5 minutes of non face to face time during this encounter for chart review and documentation.

## 2023-04-25 ENCOUNTER — Other Ambulatory Visit: Payer: Self-pay | Admitting: Nurse Practitioner

## 2023-04-25 DIAGNOSIS — J309 Allergic rhinitis, unspecified: Secondary | ICD-10-CM

## 2023-04-25 DIAGNOSIS — J0140 Acute pansinusitis, unspecified: Secondary | ICD-10-CM

## 2023-05-10 ENCOUNTER — Encounter: Payer: Self-pay | Admitting: Family Medicine

## 2023-05-10 MED ORDER — ZEPBOUND 7.5 MG/0.5ML ~~LOC~~ SOAJ: 7.5000 mg | SUBCUTANEOUS | 1 refills | Status: DC

## 2023-05-10 NOTE — Addendum Note (Signed)
Addended by: Abbe Amsterdam C on: 05/10/2023 02:46 PM   Modules accepted: Orders

## 2023-06-06 ENCOUNTER — Telehealth: Payer: BC Managed Care – PPO | Admitting: Physician Assistant

## 2023-06-06 DIAGNOSIS — J019 Acute sinusitis, unspecified: Secondary | ICD-10-CM

## 2023-06-06 DIAGNOSIS — B9689 Other specified bacterial agents as the cause of diseases classified elsewhere: Secondary | ICD-10-CM

## 2023-06-06 MED ORDER — DOXYCYCLINE HYCLATE 100 MG PO TABS: 100.0000 mg | ORAL_TABLET | Freq: Two times a day (BID) | ORAL | 0 refills | Status: DC

## 2023-06-06 NOTE — Progress Notes (Signed)

## 2023-06-06 NOTE — Progress Notes (Signed)
I have spent 5 minutes in review of e-visit questionnaire, review and updating patient chart, medical decision making and response to patient.   William Cody Martin, PA-C    

## 2023-06-07 ENCOUNTER — Ambulatory Visit: Payer: BC Managed Care – PPO | Admitting: Family Medicine

## 2023-06-07 NOTE — Progress Notes (Deleted)
[facility] at [facility] [address] [place], [place] [address] [phone]  Date:  06/07/2023   Name:  Joanne Kim   DOB:  [birth year]   MRN:  [id number]  PCP:  Pearline Cables, MD    Chief Complaint: No chief complaint on file.   History of Present Illness:  Joanne Kim is a 62 y.o. very pleasant female patient who presents with the following:  Patient seen today for follow-up from recent emergency room visit I saw her most recently in April History of obesity, sleep apnea.  She has been using zepbound the last several months for weight loss I increased her to the 7.5 mg strength in early August  She was seen at the [facility] emergency department on 8/19: Joanne Kim is a 62 y.o. female who presents today to the emergency department complaining of epigastric pain radiating to mid sternum that started this morning. Patient states that pain is radiates to the back. She denies any nausea or vomiting. She said this felt like she might of had a panic attack when it occurred as has not difficulty breathing out of bed that is now resolved. She states he thinks it may be indigestion. She describes symptoms as mild. There are no obvious aggravating or relieving factors. 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   Dry eyes 10/23/2016   Obesity 10/23/2016   RECTAL BLEEDING 12/14/2009    Past Medical History:  Diagnosis Date   Allergy    Elevated blood pressure reading    History of colon  polyps    Vaginal Pap smear, abnormal 2017    Past Surgical History:  Procedure Laterality Date   BREAST BIOPSY     BUNIONECTOMY     COLONOSCOPY  2011    Social History   Tobacco Use   Smoking status: Never   Smokeless tobacco: Never  Substance Use Topics   Alcohol use: Yes    Comment: occasionally   Drug use: No    Family History  Problem Relation Age of Onset   Heart disease Other    Stroke Other    Hypertension Other    Breast cancer Maternal Aunt    Sarcoidosis Sister    Allergic rhinitis Sister    Asthma Son    Allergic rhinitis Son    Urticaria Son    Colon polyps Mother    Allergic rhinitis Grandchild    Eczema Grandchild    Urticaria Niece    Colon cancer Neg Hx    Esophageal cancer Neg Hx    Rectal cancer Neg Hx    Stomach cancer Neg Hx    Angioedema Neg Hx    Immunodeficiency Neg Hx     Allergies  Allergen Reactions   Augmentin [Amoxicillin-Pot Clavulanate]     rash   Oxycodone Itching    Medication list has been reviewed and updated.  Current Outpatient Medications on File Prior to Visit  Medication Sig Dispense Refill   doxycycline (VIBRA-TABS) 100 MG tablet Take 1 tablet (100 mg total)  by mouth 2 (two) times daily. 20 tablet 0   ipratropium (ATROVENT) 0.03 % nasal spray Place 2 sprays into both nostrils every 12 (twelve) hours. 30 mL 12   levocetirizine (XYZAL) 5 MG tablet Take 1 tablet (5 mg total) by mouth every evening. 30 tablet 5   levocetirizine (XYZAL) 5 MG tablet Take 1 tablet (5 mg total) by mouth every evening. 30 tablet 2   metroNIDAZOLE (FLAGYL) 500 MG tablet Take 1 tablet (500 mg total) by mouth 2 (two) times daily. Take 1 pill twice daily for one week. NO alcohol 14 tablet 0   montelukast (SINGULAIR) 10 MG tablet Take 1 tablet (10 mg total) by mouth at bedtime. Use as needed for allergies 90 tablet 3   ondansetron (ZOFRAN) 4 MG tablet Take 1 tablet (4 mg total) by mouth every 8 (eight) hours as needed for nausea or vomiting. 30  tablet 0   predniSONE (DELTASONE) 20 MG tablet Take 40 mg by mouth daily for 3 days, then 20 mg daily for 3 days 9 tablet 0   Progesterone Micronized 10 % CREA Place 4 Pump onto the skin at bedtime. (Patient not taking: Reported on 11/06/2022)     Testosterone Compounding Kit 20 % CREA Apply topically. (Patient not taking: Reported on 11/06/2022)     tirzepatide (ZEPBOUND) 7.5 MG/0.5ML Pen Inject 7.5 mg into the skin once a week. 6 mL 1   No current facility-administered medications on file prior to visit.    Review of Systems:  [place]   Physical Examination: There were no vitals filed for this visit. There were no vitals filed for this visit. There is no height or weight on file to calculate BMI. Ideal Body Weight:    GEN: no acute distress. HEENT: Atraumatic, Normocephalic.  Ears and Nose: No external deformity. CV: RRR, No M/G/R. No JVD. No thrill. No extra heart sounds. PULM: CTA B, no wheezes, crackles, rhonchi. No retractions. No resp. distress. No accessory muscle use. ABD: S, NT, ND, +BS. No rebound. No HSM. EXTR: No c/c/e PSYCH: Normally interactive. Conversant.    Assessment and Plan: ***  Signed Abbe Amsterdam, MD

## 2023-06-26 ENCOUNTER — Telehealth: Payer: Self-pay

## 2023-06-26 NOTE — Telephone Encounter (Signed)
Key: [id number] Approved today by [employer] 2017 NCPDP Request Reference Number: [id number]. ZEPBOUND INJ 2.5MG  (covers current dose of 7.5mg )  is approved through 12/23/2023. Please refer to the fax or electronic case notice for further information.

## 2023-09-10 IMAGING — MG MM DIGITAL SCREENING BILAT W/ TOMO AND CAD
8 series · 8 of 24 positions shown · non-contrast
Comparison: Previous exam(s).

ACR Breast Density Category a: The breast tissue is almost entirely
fatty.

CLINICAL DATA: Screening.

EXAM:
DIGITAL SCREENING BILATERAL MAMMOGRAM WITH TOMOSYNTHESIS AND CAD
TECHNIQUE: Bilateral screening digital craniocaudal and mediolateral oblique
mammograms were obtained. Bilateral screening digital breast
tomosynthesis was performed. The images were evaluated with
computer-aided detection.

[L MLO synth-2D]
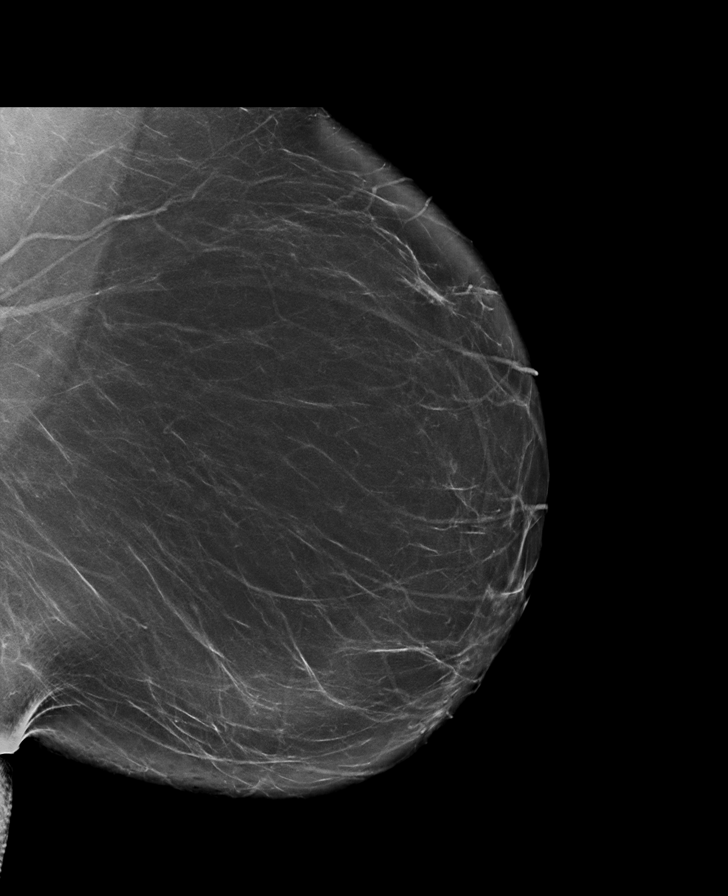

[R CC synth-2D]
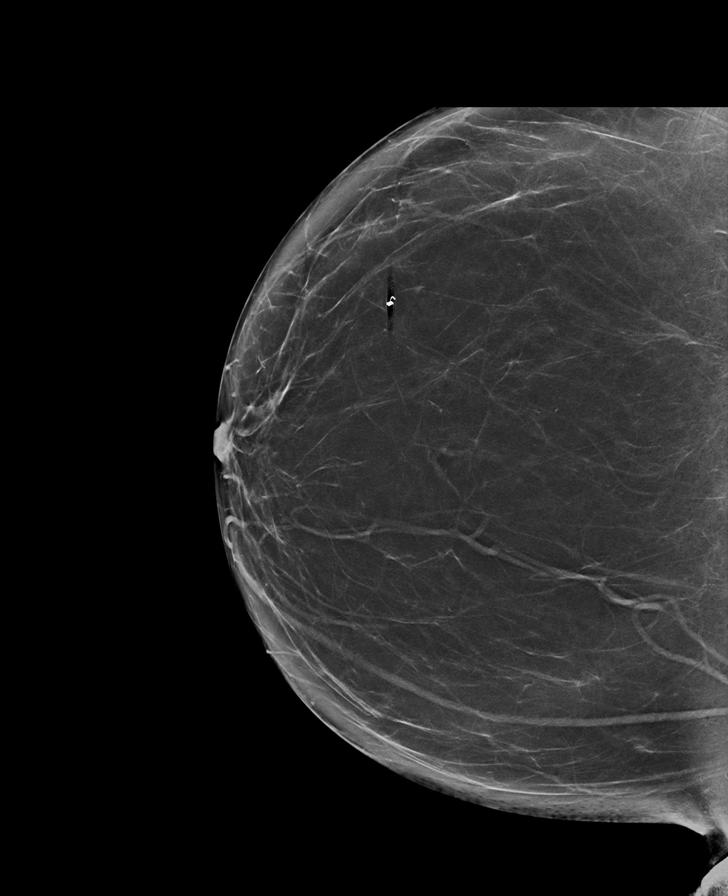

[L CC synth-2D]
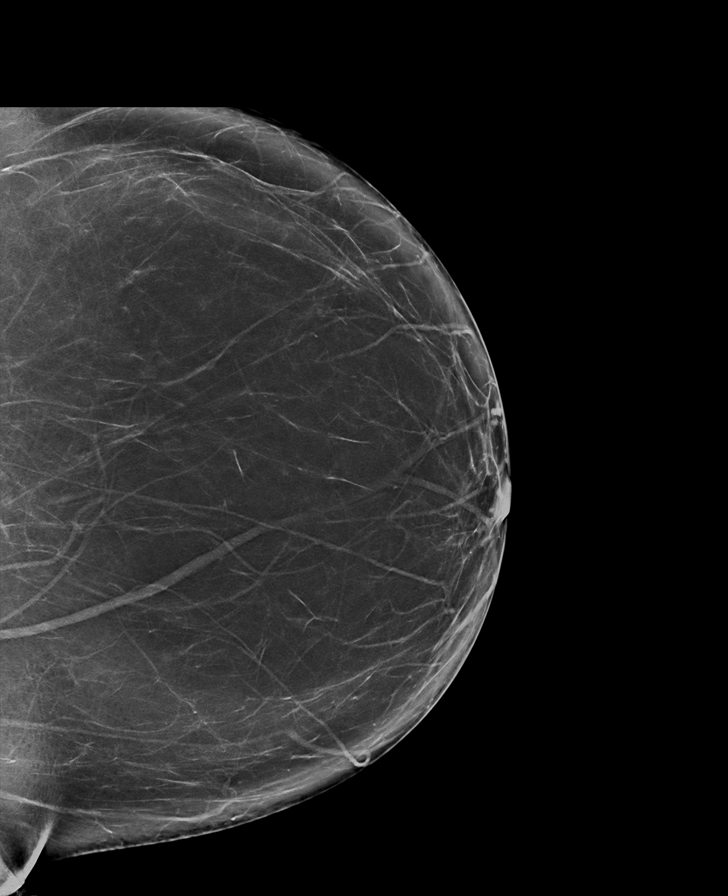

[R MLO synth-2D]
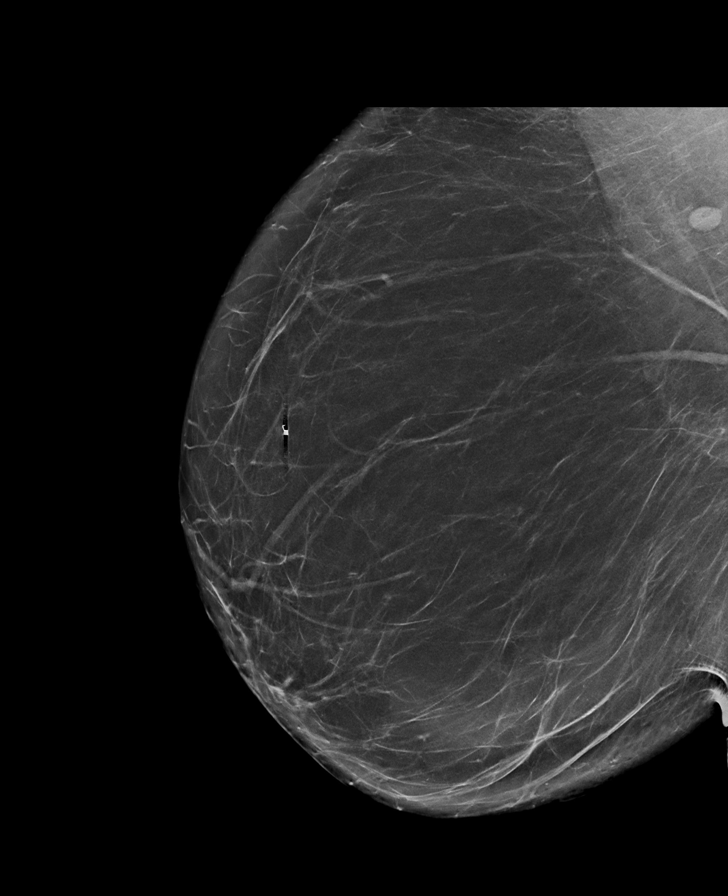

[R MLO tomo · tomo slice 46/91.0]
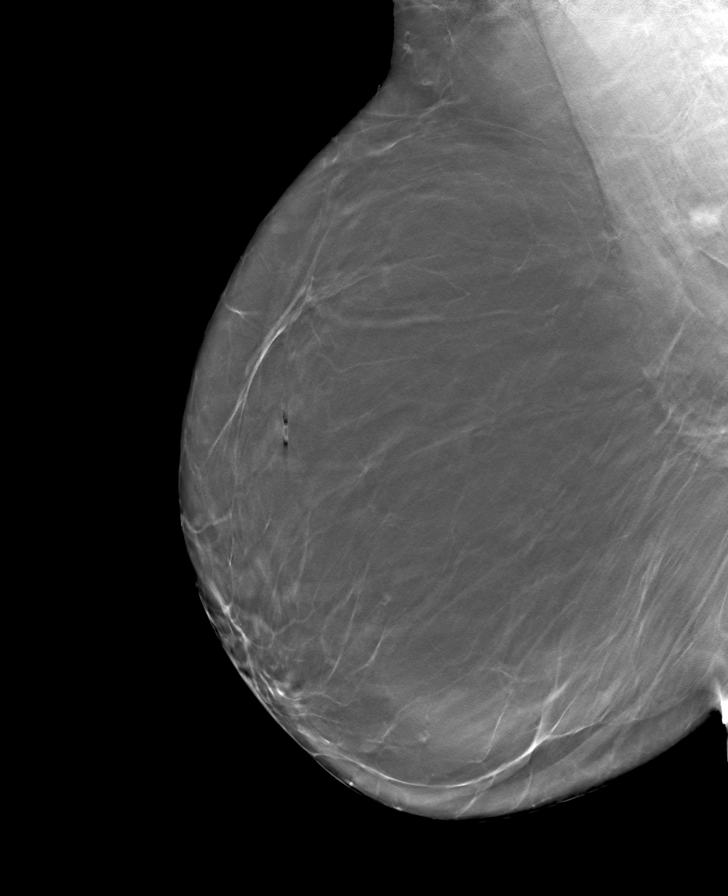

[R CC tomo · tomo slice 43/86.0]
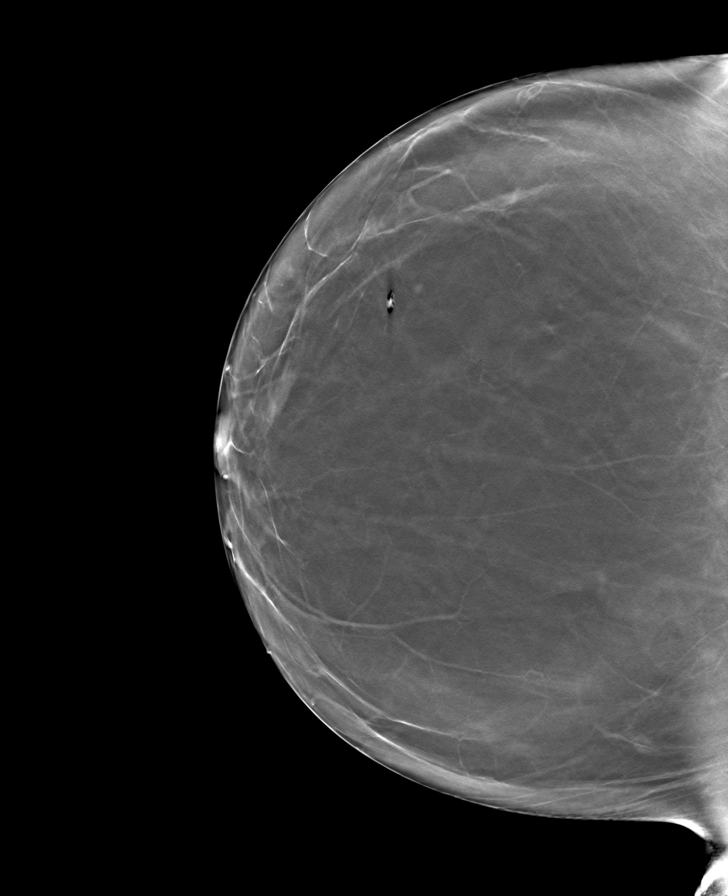

[L CC tomo · tomo slice 43/85.0]
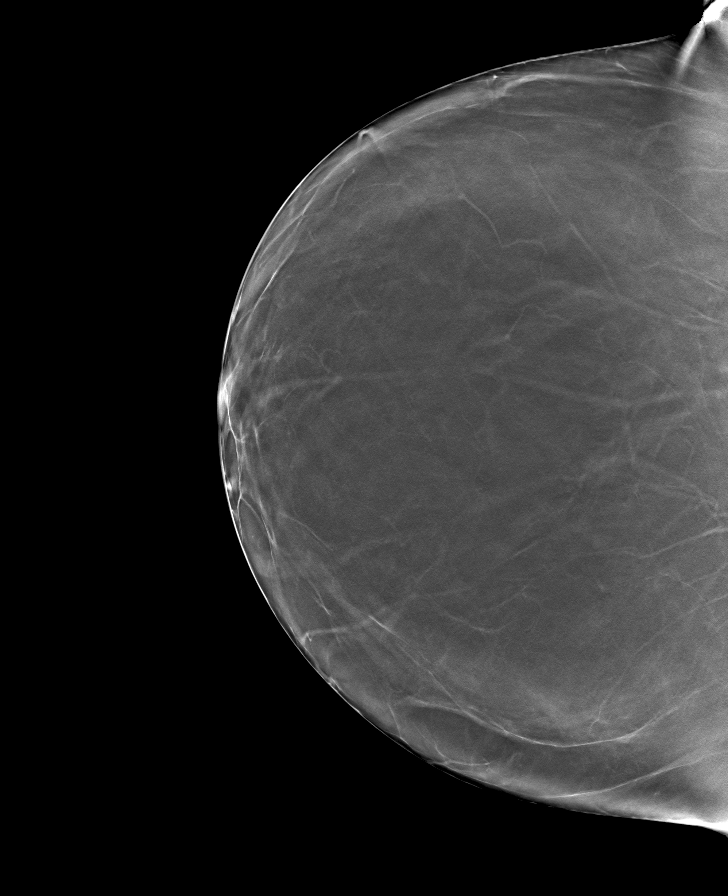

[L MLO tomo · tomo slice 47/94.0]
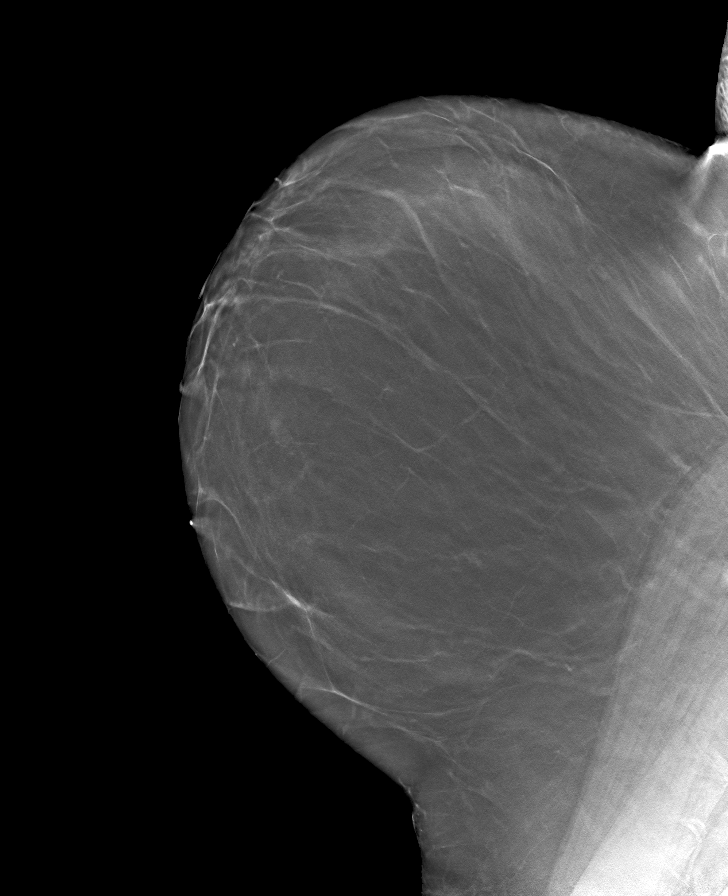

[8 of 24 positions shown; findings below may reference images not displayed]

FINDINGS: There are no findings suspicious for malignancy.
IMPRESSION: No mammographic evidence of malignancy. A result letter of this
screening mammogram will be mailed directly to the patient.

RECOMMENDATION:
Screening mammogram in one year. (Code:0E-3-N98)

BI-RADS CATEGORY  1: Negative.

## 2023-10-06 NOTE — Progress Notes (Deleted)
[facility] at [facility] [address] [place], [place] [address] [phone]  Date:  10/08/2023   Name:  Joanne Kim   DOB:  01-22-1961   MRN:  [id number]  PCP:  Pearline Cables, MD    Chief Complaint: No chief complaint on file.   History of Present Illness:  Joanne Kim is a 62 y.o. very pleasant female patient who presents with the following:  Pt seen today for recheck- History of OSA, glaucoma  Last seen by myself in April of this year  She was seen in the ER in August at [facility] - abd pain, chest pain rule out done   Flu vaccine Covid booster Mammo Shingrix  Can update labs today   Patient Active Problem List   Diagnosis Date Noted   Tick bite of left thigh 03/30/2022   Moderate obstructive sleep apnea-hypopnea syndrome 10/26/2021   Intermittent sleepiness 10/26/2021   Non-restorative sleep 10/26/2021   Postviral fatigue syndrome 10/26/2021   Aphakic open-angle glaucoma, 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   Dry eyes 10/23/2016   Obesity 10/23/2016   RECTAL BLEEDING 12/14/2009    Past Medical History:  Diagnosis Date   Allergy    Elevated blood pressure reading    History of colon polyps    Vaginal Pap smear, abnormal 2017    Past Surgical History:  Procedure Laterality Date   BREAST BIOPSY     BUNIONECTOMY     COLONOSCOPY  2011    Social History   Tobacco Use   Smoking status: Never   Smokeless tobacco: Never  Substance Use Topics   Alcohol use: Yes    Comment: occasionally   Drug use: No    Family History  Problem Relation Age of Onset   Heart disease Other    Stroke Other    Hypertension Other    Breast cancer Maternal Aunt    Sarcoidosis Sister    Allergic rhinitis Sister    Asthma Son    Allergic rhinitis Son    Urticaria Son    Colon polyps Mother    Allergic rhinitis Grandchild    Eczema Grandchild    Urticaria Niece    Colon cancer Neg Hx    Esophageal cancer Neg  Hx    Rectal cancer Neg Hx    Stomach cancer Neg Hx    Angioedema Neg Hx    Immunodeficiency Neg Hx     Allergies  Allergen Reactions   Augmentin [Amoxicillin-Pot Clavulanate]     rash   Oxycodone Itching    Medication list has been reviewed and updated.  Current Outpatient Medications on File Prior to Visit  Medication Sig Dispense Refill   doxycycline (VIBRA-TABS) 100 MG tablet Take 1 tablet (100 mg total) by mouth 2 (two) times daily. 20 tablet 0   ipratropium (ATROVENT) 0.03 % nasal spray Place 2 sprays into both nostrils every 12 (twelve) hours. 30 mL 12   levocetirizine (XYZAL) 5 MG tablet Take 1 tablet (5 mg total) by mouth every evening. 30 tablet 5   levocetirizine (XYZAL) 5 MG tablet Take 1 tablet (5 mg total) by mouth every evening. 30 tablet 2   metroNIDAZOLE (FLAGYL) 500 MG tablet Take 1 tablet (500 mg total) by mouth 2 (two) times daily. Take 1 pill twice daily for one week. NO alcohol 14 tablet 0   montelukast (SINGULAIR) 10 MG tablet Take 1 tablet (10 mg total) by mouth at bedtime. Use as  needed for allergies 90 tablet 3   ondansetron (ZOFRAN) 4 MG tablet Take 1 tablet (4 mg total) by mouth every 8 (eight) hours as needed for nausea or vomiting. 30 tablet 0   predniSONE (DELTASONE) 20 MG tablet Take 40 mg by mouth daily for 3 days, then 20 mg daily for 3 days 9 tablet 0   Progesterone Micronized 10 % CREA Place 4 Pump onto the skin at bedtime. (Patient not taking: Reported on 11/06/2022)     Testosterone Compounding Kit 20 % CREA Apply topically. (Patient not taking: Reported on 11/06/2022)     tirzepatide (ZEPBOUND) 7.5 MG/0.5ML Pen Inject 7.5 mg into the skin once a week. 6 mL 1   No current facility-administered medications on file prior to visit.    Review of Systems:  As per HPI- otherwise negative.   Physical Examination: There were no vitals filed for this visit. There were no vitals filed for this visit. There is no height or weight on file to calculate  BMI. Ideal Body Weight:    GEN: no acute distress. HEENT: Atraumatic, Normocephalic.  Ears and Nose: No external deformity. CV: RRR, No M/G/R. No JVD. No thrill. No extra heart sounds. PULM: CTA B, no wheezes, crackles, rhonchi. No retractions. No resp. distress. No accessory muscle use. ABD: S, NT, ND, +BS. No rebound. No HSM. EXTR: No c/c/e PSYCH: Normally interactive. Conversant.    Assessment and Plan: ***  Signed Abbe Amsterdam, MD

## 2023-10-08 ENCOUNTER — Ambulatory Visit: Payer: BC Managed Care – PPO | Admitting: Family Medicine

## 2023-11-27 ENCOUNTER — Telehealth: Payer: Self-pay

## 2023-11-27 NOTE — Telephone Encounter (Signed)
 Copied from CRM [id number]. Topic: General - Other >> Nov 27, 2023  3:57 PM Rodman Pickle T wrote: Reason for CRM: patient insurance has changed and she had a high deductible she wanted to see if there is something that is not expensive as zepbound

## 2023-11-28 ENCOUNTER — Encounter: Payer: Self-pay | Admitting: Family Medicine

## 2023-11-28 NOTE — Telephone Encounter (Signed)
 Likely all glp-1 txs will be expensive if deductible is high. Do you recommend the pt calling insurance?

## 2024-01-13 ENCOUNTER — Telehealth: Admitting: Family

## 2024-01-13 DIAGNOSIS — J069 Acute upper respiratory infection, unspecified: Secondary | ICD-10-CM | POA: Diagnosis not present

## 2024-01-13 MED ORDER — FLUTICASONE PROPIONATE 50 MCG/ACT NA SUSP
2.0000 | Freq: Every day | NASAL | 6 refills | Status: DC
Start: 2024-01-13 — End: 2024-01-28

## 2024-01-13 NOTE — Progress Notes (Signed)
 E-Visit for Sinus Problems  We are sorry that you are not feeling well.  Here is how we plan to help!  Based on what you have shared with me it looks like you have sinusitis.  Sinusitis is inflammation and infection in the sinus cavities of the head.  Based on your presentation I believe you most likely have Acute Viral Sinusitis.This is an infection most likely caused by a virus. There is not specific treatment for viral sinusitis other than to help you with the symptoms until the infection runs its course.  You may use an oral decongestant such as Mucinex D or if you have glaucoma or high blood pressure use plain Mucinex. Saline nasal spray help and can safely be used as often as needed for congestion, I have prescribed: Fluticasone nasal spray two sprays in each nostril once a day and continue your xyzal.   Some authorities believe that zinc sprays or the use of Echinacea may shorten the course of your symptoms.  Sinus infections are not as easily transmitted as other respiratory infection, however we still recommend that you avoid close contact with loved ones, especially the very young and elderly.  Remember to wash your hands thoroughly throughout the day as this is the number one way to prevent the spread of infection!  Home Care: Only take medications as instructed by your medical team. Do not take these medications with alcohol. A steam or ultrasonic humidifier can help congestion.  You can place a towel over your head and breathe in the steam from hot water coming from a faucet. Avoid close contacts especially the very young and the elderly. Cover your mouth when you cough or sneeze. Always remember to wash your hands.  Get Help Right Away If: You develop worsening fever or sinus pain. You develop a severe head ache or visual changes. Your symptoms persist after you have completed your treatment plan.  Make sure you Understand these instructions. Will watch your condition. Will  get help right away if you are not doing well or get worse.   Thank you for choosing an e-visit.  Your e-visit answers were reviewed by a board certified advanced clinical practitioner to complete your personal care plan. Depending upon the condition, your plan could have included both over the counter or prescription medications.  Please review your pharmacy choice. Make sure the pharmacy is open so you can pick up prescription now. If there is a problem, you may contact your provider through [employer] and have the prescription routed to another pharmacy.  Your safety is important to [place]. If you have drug allergies check your prescription carefully.   For the next 24 hours you can use MyChart to ask questions about today's visit, request a non-urgent call back, or ask for a work or school excuse. You will get an email in the next two days asking about your experience. I hope that your e-visit has been valuable and will speed your recovery.  Approximately 5 minutes was spent documenting and reviewing patient's chart.

## 2024-01-27 ENCOUNTER — Other Ambulatory Visit: Payer: Self-pay | Admitting: Family

## 2024-01-27 DIAGNOSIS — J069 Acute upper respiratory infection, unspecified: Secondary | ICD-10-CM

## 2024-02-11 ENCOUNTER — Encounter: Payer: Self-pay | Admitting: Family Medicine

## 2024-02-11 DIAGNOSIS — N6320 Unspecified lump in the left breast, unspecified quadrant: Secondary | ICD-10-CM

## 2024-02-11 DIAGNOSIS — J321 Chronic frontal sinusitis: Secondary | ICD-10-CM

## 2024-02-11 MED ORDER — DOXYCYCLINE HYCLATE 100 MG PO CAPS: 100.0000 mg | ORAL_CAPSULE | Freq: Two times a day (BID) | ORAL | 0 refills | Status: DC

## 2024-02-11 MED ORDER — PREDNISONE 20 MG PO TABS
ORAL_TABLET | ORAL | 0 refills | Status: DC
Start: 2024-02-11 — End: 2024-05-29

## 2024-02-11 NOTE — Telephone Encounter (Signed)

## 2024-02-12 NOTE — Progress Notes (Signed)
 [facility] [address], [address] [place], [place] [address] [phone]  Date:  02/18/2024   Name:  Joanne Kim   DOB:  10/13/1960   MRN:  [id number]  PCP:  Kaylee Partridge, MD    Chief Complaint: No chief complaint on file.   History of Present Illness:  Diann J Hartis is a 63 y.o. very pleasant female patient who presents with the following:  Patient seen today to discuss health maintenance Most recent visit with myself about 1 year ago History of sleep apnea, glaucoma, obesity.  Over the last year we have been treating her with Zepbound -however I believe she is no longer taking it. She stopped taking Zepbound  early this year as her insurance would no longer cover it.  She is happy with her current weight - has gained a bit from 187 and will work on this  [employer] Readings from Last 3 Encounters:  02/18/24 200 lb 12.8 oz (91.1 kg)  11/06/22 232 lb 12.8 oz (105.6 kg)  03/17/22 234 lb (106.1 kg)   She also contacted me last week with concern of sinus pressure and possible sinus infection, I sent in a prescription for prednisone  and doxycycline .  She finished up the prednisone  and is still taking her antibiotics She is also taking allergy meds- Xyzal . She was on singulair  but not very helpful   She was thinking about getting some sort of "longevity shots" but I advised this does not sound like a good investment in my opinion  Due for lab work- will update today  Mammogram- she has an appt already  Shingrix- encouraged her to get this done - she will do at some point  Pap 1 year ago showed ASCUS, HPV negative-needs repeat Pap Patient Active Problem List   Diagnosis Date Noted   Tick bite of left thigh 03/30/2022   Moderate obstructive sleep apnea-hypopnea syndrome 10/26/2021   Intermittent sleepiness 10/26/2021   Non-restorative sleep 10/26/2021   Postviral fatigue syndrome 10/26/2021   Aphakic open-angle  glaucoma, 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   Dry eyes 10/23/2016   Obesity 10/23/2016   RECTAL BLEEDING 12/14/2009    Past Medical History:  Diagnosis Date   Allergy    Elevated blood pressure reading    History of colon polyps    Vaginal Pap smear, abnormal 2017    Past Surgical History:  Procedure Laterality Date   BREAST BIOPSY     BUNIONECTOMY     COLONOSCOPY  2011    Social History   Tobacco Use   Smoking status: Never   Smokeless tobacco: Never  Substance Use Topics   Alcohol use: Yes    Comment: occasionally   Drug use: No    Family History  Problem Relation Age of Onset   Heart disease Other    Stroke Other    Hypertension Other    Breast cancer Maternal Aunt    Sarcoidosis Sister    Allergic rhinitis Sister    Asthma Son    Allergic rhinitis Son    Urticaria Son    Colon polyps Mother    Allergic rhinitis Grandchild    Eczema Grandchild    Urticaria Niece    Colon cancer Neg Hx    Esophageal cancer Neg Hx    Rectal cancer Neg Hx    Stomach cancer Neg Hx    Angioedema Neg Hx    Immunodeficiency Neg Hx     Allergies  Allergen  Reactions   Augmentin  [Amoxicillin -Pot Clavulanate]     rash   Oxycodone Itching    Medication list has been reviewed and updated.  Current Outpatient Medications on File Prior to Visit  Medication Sig Dispense Refill   doxycycline  (VIBRAMYCIN ) 100 MG capsule Take 1 capsule (100 mg total) by mouth 2 (two) times daily. 20 capsule 0   fluticasone  (FLONASE ) 50 MCG/ACT nasal spray SPRAY 2 SPRAYS INTO EACH NOSTRIL EVERY DAY 48 mL 3   ipratropium (ATROVENT ) 0.03 % nasal spray Place 2 sprays into both nostrils every 12 (twelve) hours. 30 mL 12   levocetirizine (XYZAL ) 5 MG tablet Take 1 tablet (5 mg total) by mouth every evening. 30 tablet 5   ondansetron  (ZOFRAN ) 4 MG tablet Take 1 tablet (4 mg total) by mouth every 8 (eight) hours as needed for nausea or vomiting. 30 tablet 0   predniSONE  (DELTASONE ) 20 MG tablet Take 40 mg by mouth  daily for 3 days, then 20 mg daily for 3 days 9 tablet 0   tirzepatide  (ZEPBOUND ) 7.5 MG/0.5ML Pen Inject 7.5 mg into the skin once a week. 6 mL 1   levocetirizine (XYZAL ) 5 MG tablet Take 1 tablet (5 mg total) by mouth every evening. 30 tablet 2   Progesterone Micronized 10 % CREA Place 4 Pump onto the skin at bedtime. (Patient not taking: Reported on 11/06/2022)     Testosterone Compounding Kit 20 % CREA Apply topically. (Patient not taking: Reported on 11/06/2022)     No current facility-administered medications on file prior to visit.    Review of Systems:  As per HPI- otherwise negative.   Physical Examination: Vitals:   02/18/24 1255 02/18/24 1324  BP: (!) 150/96 125/85  Pulse: 66   Resp: 16   Temp: 98 F (36.7 C)   SpO2: 98%    Vitals:   02/18/24 1255  Weight: 200 lb 12.8 oz (91.1 kg)  Height: 5\' 9"  (1.753 m)   Body mass index is 29.65 kg/m. Ideal Body Weight: Weight in (lb) to have BMI = 25: 168.9  GEN: no acute distress. Overweight, looks well  HEENT: Atraumatic, Normocephalic.  Bilateral TM wnl, oropharynx normal.  PEERL,EOMI.   Ears and Nose: No external deformity. CV: RRR, No M/G/R. No JVD. No thrill. No extra heart sounds. PULM: CTA B, no wheezes, crackles, rhonchi. No retractions. No resp. distress. No accessory muscle use. ABD: S, NT, ND, +BS. No rebound. No HSM. EXTR: No c/c/e PSYCH: Normally interactive. Conversant.  Collected pap- normal vulva, vagina, cervix   BP Readings from Last 3 Encounters:  02/18/24 125/85  01/11/23 122/80  11/06/22 128/76     Assessment and Plan: Screening for cervical cancer - Plan: Cytology - PAP  Screening for diabetes mellitus - Plan: Comprehensive metabolic panel with GFR, Hemoglobin A1c  Thyroid  disorder screening - Plan: TSH  Screening for deficiency anemia - Plan: CBC  Vitamin D  deficiency - Plan: VITAMIN D  25 Hydroxy (Vit-D Deficiency, Fractures)  Screening, lipid - Plan: Lipid panel  Need for  pneumococcal vaccination - Plan: Pneumococcal conjugate vaccine 20-valent (Prevnar 20)  Patient seen today for follow-up visit Routine blood work pending as above Pap smear collected today, gave Prevnar Encouraged Shingrix Will plan further follow- up pending labs.  Signed Gates Kasal, MD  Addendum 5/13, received labs as below.  Message to patient  Results for orders placed or performed in visit on 02/18/24  CBC   Collection Time: 02/18/24  1:40 PM  Result Value Ref Range  WBC 9.3 4.0 - 10.5 K/uL   RBC 4.86 3.87 - 5.11 Mil/uL   Platelets 192.0 150.0 - 400.0 K/uL   Hemoglobin 14.1 12.0 - 15.0 g/dL   HCT 16.1 09.6 - 04.5 %   MCV 90.8 78.0 - 100.0 fl   MCHC 32.0 30.0 - 36.0 g/dL   RDW 40.9 (H) 81.1 - 91.4 %  Comprehensive metabolic panel with GFR   Collection Time: 02/18/24  1:40 PM  Result Value Ref Range   Sodium 142 135 - 145 mEq/L   Potassium 4.0 3.5 - 5.1 mEq/L   Chloride 103 96 - 112 mEq/L   CO2 32 19 - 32 mEq/L   Glucose, Bld 77 70 - 99 mg/dL   BUN 11 6 - 23 mg/dL   Creatinine, Ser 7.82 0.40 - 1.20 mg/dL   Total Bilirubin 0.8 0.2 - 1.2 mg/dL   Alkaline Phosphatase 83 39 - 117 U/L   AST 24 0 - 37 U/L   ALT 25 0 - 35 U/L   Total Protein 6.9 6.0 - 8.3 g/dL   Albumin 4.2 3.5 - 5.2 g/dL   GFR 95.62 >13.08 mL/min   Calcium 9.8 8.4 - 10.5 mg/dL  Hemoglobin M5H   Collection Time: 02/18/24  1:40 PM  Result Value Ref Range   Hgb A1c MFr Bld 5.7 4.6 - 6.5 %  Lipid panel   Collection Time: 02/18/24  1:40 PM  Result Value Ref Range   Cholesterol 211 (H) 0 - 200 mg/dL   Triglycerides 84.6 0.0 - 149.0 mg/dL   HDL 96.29 >52.84 mg/dL   VLDL 13.2 0.0 - 44.0 mg/dL   LDL Cholesterol 102 (H) 0 - 99 mg/dL   Total CHOL/HDL Ratio 3    NonHDL 137.21   TSH   Collection Time: 02/18/24  1:40 PM  Result Value Ref Range   TSH 2.00 0.35 - 5.50 uIU/mL  VITAMIN D  25 Hydroxy (Vit-D Deficiency, Fractures)   Collection Time: 02/18/24  1:40 PM  Result Value Ref Range   VITD 25.50  (L) 30.00 - 100.00 ng/mL   The 10-year ASCVD risk score (Arnett DK, et al., 2019) is: 4.3%   Values used to calculate the score:     Age: 70 years     Sex: Female     Is Non-Hispanic African American: Yes     Diabetic: No     Tobacco smoker: No     Systolic Blood Pressure: 125 mmHg     Is BP treated: No     HDL Cholesterol: 73.7 mg/dL     Total Cholesterol: 211 mg/dL

## 2024-02-12 NOTE — Patient Instructions (Addendum)
 It was good to see you today, I will be in touch with your labs and Pap results asap  Please do get your Shingrix (shingles) vaccine series asap

## 2024-02-18 ENCOUNTER — Ambulatory Visit (INDEPENDENT_AMBULATORY_CARE_PROVIDER_SITE_OTHER): Admitting: Family Medicine

## 2024-02-18 ENCOUNTER — Other Ambulatory Visit (HOSPITAL_COMMUNITY)
Admission: RE | Admit: 2024-02-18 | Discharge: 2024-02-18 | Disposition: A | Discharge status: Home / Self Care | Source: Ambulatory Visit | Attending: Family Medicine | Admitting: Family Medicine

## 2024-02-18 ENCOUNTER — Encounter: Payer: Self-pay | Admitting: Family Medicine

## 2024-02-18 VITALS — BP 125/85 | HR 66 | Temp 98.0°F | Resp 16 | Ht 69.0 in | Wt 200.8 lb

## 2024-02-18 DIAGNOSIS — Z124 Encounter for screening for malignant neoplasm of cervix: Secondary | ICD-10-CM

## 2024-02-18 DIAGNOSIS — E559 Vitamin D deficiency, unspecified: Secondary | ICD-10-CM | POA: Diagnosis not present

## 2024-02-18 DIAGNOSIS — Z130 Encounter for screening for diseases of the blood and blood-forming organs and certain disorders involving the immune mechanism: Secondary | ICD-10-CM | POA: Diagnosis not present

## 2024-02-18 DIAGNOSIS — Z13220 Encounter for screening for lipoid disorders: Secondary | ICD-10-CM | POA: Diagnosis not present

## 2024-02-18 DIAGNOSIS — Z1329 Encounter for screening for other suspected endocrine disorder: Secondary | ICD-10-CM | POA: Diagnosis not present

## 2024-02-18 DIAGNOSIS — Z131 Encounter for screening for diabetes mellitus: Secondary | ICD-10-CM | POA: Diagnosis not present

## 2024-02-18 DIAGNOSIS — Z23 Encounter for immunization: Secondary | ICD-10-CM

## 2024-02-19 ENCOUNTER — Encounter: Payer: Self-pay | Admitting: Family Medicine

## 2024-02-19 LAB — COMPREHENSIVE METABOLIC PANEL WITH GFR
ALT: 25 U/L (ref 0–35)
AST: 24 U/L (ref 0–37)
Albumin: 4.2 g/dL (ref 3.5–5.2)
Alkaline Phosphatase: 83 U/L (ref 39–117)
BUN: 11 mg/dL (ref 6–23)
CO2: 32 meq/L (ref 19–32)
Calcium: 9.8 mg/dL (ref 8.4–10.5)
Chloride: 103 meq/L (ref 96–112)
Creatinine, Ser: 0.78 mg/dL (ref 0.40–1.20)
GFR: 82.31 mL/min (ref >60.00)
Glucose, Bld: 77 mg/dL (ref 70–99)
Potassium: 4 meq/L (ref 3.5–5.1)
Sodium: 142 meq/L (ref 135–145)
Total Bilirubin: 0.8 mg/dL (ref 0.2–1.2)
Total Protein: 6.9 g/dL (ref 6.0–8.3)

## 2024-02-19 LAB — LIPID PANEL
Cholesterol: 211 mg/dL — ABNORMAL HIGH (ref 0–200)
HDL: 73.7 mg/dL (ref >39.00)
LDL Cholesterol: 120 mg/dL — ABNORMAL HIGH (ref 0–99)
NonHDL: 137.21
Total CHOL/HDL Ratio: 3
Triglycerides: 87 mg/dL (ref 0.0–149.0)
VLDL: 17.4 mg/dL (ref 0.0–40.0)

## 2024-02-19 LAB — CBC
HCT: 44.2 % (ref 36.0–46.0)
Hemoglobin: 14.1 g/dL (ref 12.0–15.0)
MCHC: 32 g/dL (ref 30.0–36.0)
MCV: 90.8 fl (ref 78.0–100.0)
Platelets: 192 10*3/uL (ref 150.0–400.0)
RBC: 4.86 Mil/uL (ref 3.87–5.11)
RDW: 16 % — ABNORMAL HIGH (ref 11.5–15.5)
WBC: 9.3 10*3/uL (ref 4.0–10.5)

## 2024-02-19 LAB — VITAMIN D 25 HYDROXY (VIT D DEFICIENCY, FRACTURES): VITD: 25.5 ng/mL — ABNORMAL LOW (ref 30.00–100.00)

## 2024-02-19 LAB — TSH: TSH: 2 u[IU]/mL (ref 0.35–5.50)

## 2024-02-19 LAB — HEMOGLOBIN A1C: Hgb A1c MFr Bld: 5.7 % (ref 4.6–6.5)

## 2024-02-20 ENCOUNTER — Encounter: Payer: Self-pay | Admitting: Family Medicine

## 2024-02-20 LAB — CYTOLOGY - PAP
Comment: NEGATIVE
Diagnosis: NEGATIVE
High risk HPV: NEGATIVE

## 2024-02-29 ENCOUNTER — Other Ambulatory Visit: Payer: Self-pay

## 2024-03-12 ENCOUNTER — Ambulatory Visit
Admission: RE | Admit: 2024-03-12 | Discharge: 2024-03-12 | Disposition: A | Discharge status: Home / Self Care | Payer: Self-pay | Source: Ambulatory Visit | Attending: Family Medicine | Admitting: Family Medicine

## 2024-03-12 DIAGNOSIS — N6320 Unspecified lump in the left breast, unspecified quadrant: Secondary | ICD-10-CM

## 2024-04-05 NOTE — Progress Notes (Deleted)
 [facility] [address], [address] [place], [place] [address] [phone]  Date:  04/07/2024   Name:  Joanne Kim   DOB:  1961-04-14   MRN:  [id number]  PCP:  Joanne Harlene BROCKS, MD    Chief Complaint: No chief complaint on file.   History of Present Illness:  Joanne Kim is a 63 y.o. very pleasant female patient who presents with the following:  Patient seen today with gassiness and concerned about her gallbladder.  Most recent visit with myself was in May for her Pap and other general health maintenance  History of sleep apnea, glaucoma, obesity-she recently lost weight using Zepbound , no longer using Zepbound   Patient Active Problem List   Diagnosis Date Noted   Tick bite of left thigh 03/30/2022   Moderate obstructive sleep apnea-hypopnea syndrome 10/26/2021   Intermittent sleepiness 10/26/2021   Non-restorative sleep 10/26/2021   Postviral fatigue syndrome 10/26/2021   Aphakic open-angle glaucoma, 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   Dry eyes 10/23/2016   Obesity 10/23/2016   RECTAL BLEEDING 12/14/2009    Past Medical History:  Diagnosis Date   Allergy    Elevated blood pressure reading    History of colon polyps    Vaginal Pap smear, abnormal 2017    Past Surgical History:  Procedure Laterality Date   BREAST BIOPSY     BUNIONECTOMY     COLONOSCOPY  2011    Social History   Tobacco Use   Smoking status: Never   Smokeless tobacco: Never  Substance Use Topics   Alcohol use: Yes    Comment: occasionally   Drug use: No    Family History  Problem Relation Age of Onset   Heart disease Other    Stroke Other    Hypertension Other    Breast cancer Maternal Aunt    Sarcoidosis Sister    Allergic rhinitis Sister    Asthma Son    Allergic rhinitis Son    Urticaria Son    Colon polyps Mother    Allergic rhinitis Grandchild    Eczema Grandchild    Urticaria Niece    Colon cancer Neg Hx     Esophageal cancer Neg Hx    Rectal cancer Neg Hx    Stomach cancer Neg Hx    Angioedema Neg Hx    Immunodeficiency Neg Hx     Allergies  Allergen Reactions   Augmentin  [Amoxicillin -Pot Clavulanate]     rash   Oxycodone Itching    Medication list has been reviewed and updated.  Current Outpatient Medications on File Prior to Visit  Medication Sig Dispense Refill   doxycycline  (VIBRAMYCIN ) 100 MG capsule Take 1 capsule (100 mg total) by mouth 2 (two) times daily. 20 capsule 0   fluticasone  (FLONASE ) 50 MCG/ACT nasal spray SPRAY 2 SPRAYS INTO EACH NOSTRIL EVERY DAY 48 mL 3   ipratropium (ATROVENT ) 0.03 % nasal spray Place 2 sprays into both nostrils every 12 (twelve) hours. 30 mL 12   levocetirizine (XYZAL ) 5 MG tablet Take 1 tablet (5 mg total) by mouth every evening. 30 tablet 5   levocetirizine (XYZAL ) 5 MG tablet Take 1 tablet (5 mg total) by mouth every evening. 30 tablet 2   ondansetron  (ZOFRAN ) 4 MG tablet Take 1 tablet (4 mg total) by mouth every 8 (eight) hours as needed for nausea or vomiting. 30 tablet 0   predniSONE  (DELTASONE ) 20 MG tablet Take 40 mg by mouth daily  for 3 days, then 20 mg daily for 3 days 9 tablet 0   Progesterone Micronized 10 % CREA Place 4 Pump onto the skin at bedtime. (Patient not taking: Reported on 11/06/2022)     Testosterone Compounding Kit 20 % CREA Apply topically. (Patient not taking: Reported on 11/06/2022)     tirzepatide  (ZEPBOUND ) 7.5 MG/0.5ML Pen Inject 7.5 mg into the skin once a week. 6 mL 1   No current facility-administered medications on file prior to visit.    Review of Systems:  As per HPI- otherwise negative.   Physical Examination: There were no vitals filed for this visit. There were no vitals filed for this visit. There is no height or weight on file to calculate BMI. Ideal Body Weight:    GEN: no acute distress. HEENT: Atraumatic, Normocephalic.  Ears and Nose: No external deformity. CV: RRR, No M/G/R. No JVD. No  thrill. No extra heart sounds. PULM: CTA B, no wheezes, crackles, rhonchi. No retractions. No resp. distress. No accessory muscle use. ABD: S, NT, ND, +BS. No rebound. No HSM. EXTR: No c/c/e PSYCH: Normally interactive. Conversant.    Assessment and Plan: ***  Signed Harlene Schroeder, MD

## 2024-04-07 ENCOUNTER — Ambulatory Visit: Admitting: Family Medicine

## 2024-05-05 ENCOUNTER — Telehealth: Admitting: Physician Assistant

## 2024-05-05 DIAGNOSIS — J069 Acute upper respiratory infection, unspecified: Secondary | ICD-10-CM

## 2024-05-05 NOTE — Progress Notes (Signed)
 E-Visit for Sinus Problems  We are sorry that you are not feeling well.  Here is how we plan to help!  Based on what you have shared with me it looks like you have sinusitis.  Sinusitis is inflammation and infection in the sinus cavities of the head.  Based on your presentation I believe you most likely have Acute Viral Sinusitis.This is an infection most likely caused by a virus. There is not specific treatment for viral sinusitis other than to help you with the symptoms until the infection runs its course.  You may use an oral decongestant such as Mucinex  D or if you have glaucoma or high blood pressure use plain Mucinex . Saline nasal spray help and can safely be used as often as needed for congestion, I also recommend Fluticasone  nasal spray two sprays in each nostril once a day. Recommend Mucinex  for congestion.   Some authorities believe that zinc sprays or the use of Echinacea may shorten the course of your symptoms.  Sinus infections are not as easily transmitted as other respiratory infection, however we still recommend that you avoid close contact with loved ones, especially the very young and elderly.  Remember to wash your hands thoroughly throughout the day as this is the number one way to prevent the spread of infection!  Home Care: Only take medications as instructed by your medical team. Do not take these medications with alcohol. A steam or ultrasonic humidifier can help congestion.  You can place a towel over your head and breathe in the steam from hot water coming from a faucet. Avoid close contacts especially the very young and the elderly. Cover your mouth when you cough or sneeze. Always remember to wash your hands.  Get Help Right Away If: You develop worsening fever or sinus pain. You develop a severe head ache or visual changes. Your symptoms persist after you have completed your treatment plan.  Make sure you Understand these instructions. Will watch your  condition. Will get help right away if you are not doing well or get worse.   Thank you for choosing an e-visit.  Your e-visit answers were reviewed by a board certified advanced clinical practitioner to complete your personal care plan. Depending upon the condition, your plan could have included both over the counter or prescription medications.  Please review your pharmacy choice. Make sure the pharmacy is open so you can pick up prescription now. If there is a problem, you may contact your provider through [employer] and have the prescription routed to another pharmacy.  Your safety is important to us . If you have drug allergies check your prescription carefully.   For the next 24 hours you can use MyChart to ask questions about today's visit, request a non-urgent call back, or ask for a work or school excuse. You will get an email in the next two days asking about your experience. I hope that your e-visit has been valuable and will speed your recovery.    I have spent 5 minutes in review of e-visit questionnaire, review and updating patient chart, medical decision making and response to patient.   Harlene PEDLAR Ward, PA-C

## 2024-05-08 ENCOUNTER — Other Ambulatory Visit: Payer: Self-pay | Admitting: Family Medicine

## 2024-05-08 DIAGNOSIS — J321 Chronic frontal sinusitis: Secondary | ICD-10-CM

## 2024-05-08 NOTE — Telephone Encounter (Signed)
 Copied from CRM [id number]. Topic: Clinical - Medication Refill >> May 08, 2024  8:10 AM Montie POUR wrote: Medication: predniSONE  (DELTASONE ) 20 MG tablet AND doxycycline  (VIBRAMYCIN ) 100 MG capsule  Has the patient contacted their pharmacy? Yes (Agent: If no, request that the patient contact the pharmacy for the refill. If patient does not wish to contact the pharmacy document the reason why and proceed with request.) (Agent: If yes, when and what did the pharmacy advise?) Pharmacy needs order to refill  This is the patient's preferred pharmacy:  [id number] - [place], [place] - [address] [place] [address] Phone: [phone] Fax: [phone]  Is this the correct pharmacy for this prescription? Yes If no, delete pharmacy and type the correct one.   Has the prescription been filled recently? No  Is the patient out of the medication? Yes  Has the patient been seen for an appointment in the last year OR does the patient have an upcoming appointment? Yes  Can we respond through MyChart? Yes  Agent: Please be advised that Rx refills may take up to 3 business days. We ask that you follow-up with your pharmacy.

## 2024-05-12 NOTE — Progress Notes (Deleted)
 [facility] [address] [place], [place] [address] [phone]  Date:  05/15/2024   Name:  Joanne Kim   DOB:  1960/11/17   MRN:  [id number]  PCP:  Watt Harlene BROCKS, MD    Chief Complaint: No chief complaint on file.   History of Present Illness:  Joanne Kim is a 63 y.o. very pleasant female patient who presents with the following:  Patient seen today with concern of lower back pain.  Most recent visit with myself was in March for her Pap smear History of glaucoma, sleep apnea  Offer Shingrix  Patient Active Problem List   Diagnosis Date Noted   Moderate obstructive sleep apnea-hypopnea syndrome 10/26/2021   Intermittent sleepiness 10/26/2021   Non-restorative sleep 10/26/2021   Aphakic open-angle glaucoma, 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   Dry eyes 10/23/2016   Obesity 10/23/2016   RECTAL BLEEDING 12/14/2009    Past Medical History:  Diagnosis Date   Allergy    Elevated blood pressure reading    History of colon polyps    Vaginal Pap smear, abnormal 2017    Past Surgical History:  Procedure Laterality Date   BREAST BIOPSY     BUNIONECTOMY     COLONOSCOPY  2011    Social History   Tobacco Use   Smoking status: Never   Smokeless tobacco: Never  Substance Use Topics   Alcohol use: Yes    Comment: occasionally   Drug use: No    Family History  Problem Relation Age of Onset   Heart disease Other    Stroke Other    Hypertension Other    Breast cancer Maternal Aunt    Sarcoidosis Sister    Allergic rhinitis Sister    Asthma Son    Allergic rhinitis Son    Urticaria Son    Colon polyps Mother    Allergic rhinitis Grandchild    Eczema Grandchild    Urticaria Niece    Colon cancer Neg Hx    Esophageal cancer Neg Hx    Rectal cancer Neg Hx    Stomach cancer Neg Hx    Angioedema Neg Hx    Immunodeficiency Neg Hx     Allergies  Allergen Reactions   Augmentin   [Amoxicillin -Pot Clavulanate]     rash   Oxycodone Itching    Medication list has been reviewed and updated.  Current Outpatient Medications on File Prior to Visit  Medication Sig Dispense Refill   doxycycline  (VIBRAMYCIN ) 100 MG capsule Take 1 capsule (100 mg total) by mouth 2 (two) times daily. 20 capsule 0   fluticasone  (FLONASE ) 50 MCG/ACT nasal spray SPRAY 2 SPRAYS INTO EACH NOSTRIL EVERY DAY 48 mL 3   ipratropium (ATROVENT ) 0.03 % nasal spray Place 2 sprays into both nostrils every 12 (twelve) hours. 30 mL 12   levocetirizine (XYZAL ) 5 MG tablet Take 1 tablet (5 mg total) by mouth every evening. 30 tablet 5   levocetirizine (XYZAL ) 5 MG tablet Take 1 tablet (5 mg total) by mouth every evening. 30 tablet 2   ondansetron  (ZOFRAN ) 4 MG tablet Take 1 tablet (4 mg total) by mouth every 8 (eight) hours as needed for nausea or vomiting. 30 tablet 0   predniSONE  (DELTASONE ) 20 MG tablet Take 40 mg by mouth daily for 3 days, then 20 mg daily for 3 days 9 tablet 0   Progesterone Micronized 10 % CREA Place 4 Pump onto the skin at  bedtime. (Patient not taking: Reported on 11/06/2022)     Testosterone Compounding Kit 20 % CREA Apply topically. (Patient not taking: Reported on 11/06/2022)     tirzepatide  (ZEPBOUND ) 7.5 MG/0.5ML Pen Inject 7.5 mg into the skin once a week. 6 mL 1   No current facility-administered medications on file prior to visit.    Review of Systems:  As per HPI- otherwise negative.   Physical Examination: There were no vitals filed for this visit. There were no vitals filed for this visit. There is no height or weight on file to calculate BMI. Ideal Body Weight:    GEN: no acute distress. HEENT: Atraumatic, Normocephalic.  Ears and Nose: No external deformity. CV: RRR, No M/G/R. No JVD. No thrill. No extra heart sounds. PULM: CTA B, no wheezes, crackles, rhonchi. No retractions. No resp. distress. No accessory muscle use. ABD: S, NT, ND, +BS. No rebound. No  HSM. EXTR: No c/c/e PSYCH: Normally interactive. Conversant.    Assessment and Plan: ***  Signed Harlene Schroeder, MD

## 2024-05-12 NOTE — Patient Instructions (Incomplete)
It was good to see you again today!   

## 2024-05-15 ENCOUNTER — Ambulatory Visit: Admitting: Family Medicine

## 2024-05-29 ENCOUNTER — Ambulatory Visit (INDEPENDENT_AMBULATORY_CARE_PROVIDER_SITE_OTHER): Admitting: Family Medicine

## 2024-05-29 ENCOUNTER — Encounter: Payer: Self-pay | Admitting: Family Medicine

## 2024-05-29 ENCOUNTER — Other Ambulatory Visit (HOSPITAL_BASED_OUTPATIENT_CLINIC_OR_DEPARTMENT_OTHER): Payer: Self-pay

## 2024-05-29 VITALS — BP 132/84 | HR 70 | Temp 98.6°F | Ht 69.0 in | Wt 207.6 lb

## 2024-05-29 DIAGNOSIS — J321 Chronic frontal sinusitis: Secondary | ICD-10-CM

## 2024-05-29 DIAGNOSIS — J069 Acute upper respiratory infection, unspecified: Secondary | ICD-10-CM | POA: Diagnosis not present

## 2024-05-29 DIAGNOSIS — R5381 Other malaise: Secondary | ICD-10-CM | POA: Diagnosis not present

## 2024-05-29 DIAGNOSIS — J302 Other seasonal allergic rhinitis: Secondary | ICD-10-CM

## 2024-05-29 LAB — POC COVID19 BINAXNOW: SARS Coronavirus 2 Ag: NEGATIVE

## 2024-05-29 MED ORDER — LEVOCETIRIZINE DIHYDROCHLORIDE 5 MG PO TABS
5.0000 mg | ORAL_TABLET | Freq: Every evening | ORAL | 3 refills | Status: DC
  Filled 2024-05-29: qty 90, 90d supply, fill #0

## 2024-05-29 MED ORDER — DOXYCYCLINE HYCLATE 100 MG PO CAPS
100.0000 mg | ORAL_CAPSULE | Freq: Two times a day (BID) | ORAL | 0 refills | Status: DC
  Filled 2024-05-29: qty 20, 10d supply, fill #0

## 2024-05-29 MED ORDER — FLUTICASONE PROPIONATE 50 MCG/ACT NA SUSP
2.0000 | Freq: Every day | NASAL | 3 refills | Status: AC
  Filled 2024-05-29: qty 48, 90d supply, fill #0

## 2024-05-29 MED ORDER — PREDNISONE 20 MG PO TABS
ORAL_TABLET | ORAL | 0 refills | Status: AC
  Filled 2024-05-29: qty 9, 6d supply, fill #0

## 2024-05-29 NOTE — Progress Notes (Signed)
 [facility] [address], [address] [place], [place] [address] [phone]  Date:  05/29/2024   Name:  Joanne Kim   DOB:  1961/02/24   MRN:  [id number]  PCP:  Watt Harlene BROCKS, MD    Chief Complaint: Sinus Problem (Onset a few days ago, I feel horrible I've been sneezing and my sinuses are so dry)   History of Present Illness:  Joanne Kim is a 63 y.o. very pleasant female patient who presents with the following:  Patient seen today with concern of possible sinus infection Saw her for routine visit in May-history of sleep apnea, glaucoma, obesity.  She was previously on Zepbound  but had stopped using it in May and was working on maintaining her weight She did a virtual visit on July 28 and was diagnosed with likely viral or allergic sinusitis She then requested a refill of doxycycline  and prednisone  via MyChart but I do not think I was aware of this refill request- she did not get these rx   Here today with her 25 yo granddaughter  She notes that her sinus sx did seem to get better - however over the last 4-5 days she has noted a lot of sneezing, achiness, cough No fever, no ST   Lab Results  Component Value Date   HGBA1C 5.7 02/18/2024     Patient Active Problem List   Diagnosis Date Noted   Moderate obstructive sleep apnea-hypopnea syndrome 10/26/2021   Intermittent sleepiness 10/26/2021   Non-restorative sleep 10/26/2021   Aphakic open-angle glaucoma, 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   Dry eyes 10/23/2016   Obesity 10/23/2016   RECTAL BLEEDING 12/14/2009    Past Medical History:  Diagnosis Date   Allergy    Elevated blood pressure reading    History of colon polyps    Vaginal Pap smear, abnormal 2017    Past Surgical History:  Procedure Laterality Date   BREAST BIOPSY     BUNIONECTOMY     COLONOSCOPY  2011    Social History   Tobacco Use   Smoking status: Never   Smokeless tobacco: Never   Substance Use Topics   Alcohol use: Yes    Comment: occasionally   Drug use: No    Family History  Problem Relation Age of Onset   Heart disease Other    Stroke Other    Hypertension Other    Breast cancer Maternal Aunt    Sarcoidosis Sister    Allergic rhinitis Sister    Asthma Son    Allergic rhinitis Son    Urticaria Son    Colon polyps Mother    Allergic rhinitis Grandchild    Eczema Grandchild    Urticaria Niece    Colon cancer Neg Hx    Esophageal cancer Neg Hx    Rectal cancer Neg Hx    Stomach cancer Neg Hx    Angioedema Neg Hx    Immunodeficiency Neg Hx     Allergies  Allergen Reactions   Augmentin  [Amoxicillin -Pot Clavulanate]     rash   Oxycodone Itching    Medication list has been reviewed and updated.  Current Outpatient Medications on File Prior to Visit  Medication Sig Dispense Refill   ipratropium (ATROVENT ) 0.03 % nasal spray Place 2 sprays into both nostrils every 12 (twelve) hours. 30 mL 12   doxycycline  (VIBRAMYCIN ) 100 MG capsule Take 1 capsule (100 mg total) by mouth 2 (two) times daily. (Patient not  taking: Reported on 05/29/2024) 20 capsule 0   fluticasone  (FLONASE ) 50 MCG/ACT nasal spray SPRAY 2 SPRAYS INTO EACH NOSTRIL EVERY DAY (Patient not taking: Reported on 05/29/2024) 48 mL 3   levocetirizine (XYZAL ) 5 MG tablet Take 1 tablet (5 mg total) by mouth every evening. (Patient not taking: Reported on 05/29/2024) 30 tablet 5   levocetirizine (XYZAL ) 5 MG tablet Take 1 tablet (5 mg total) by mouth every evening. (Patient not taking: Reported on 05/29/2024) 30 tablet 2   ondansetron  (ZOFRAN ) 4 MG tablet Take 1 tablet (4 mg total) by mouth every 8 (eight) hours as needed for nausea or vomiting. (Patient not taking: Reported on 05/29/2024) 30 tablet 0   predniSONE  (DELTASONE ) 20 MG tablet Take 40 mg by mouth daily for 3 days, then 20 mg daily for 3 days (Patient not taking: Reported on 05/29/2024) 9 tablet 0   Progesterone Micronized 10 % CREA Place 4  Pump onto the skin at bedtime. (Patient not taking: Reported on 11/06/2022)     Testosterone Compounding Kit 20 % CREA Apply topically. (Patient not taking: Reported on 11/06/2022)     tirzepatide  (ZEPBOUND ) 7.5 MG/0.5ML Pen Inject 7.5 mg into the skin once a week. (Patient not taking: Reported on 05/29/2024) 6 mL 1   No current facility-administered medications on file prior to visit.    Review of Systems:  As per HPI- otherwise negative.   Physical Examination: Vitals:   05/29/24 1401  BP: 132/84  Pulse: 70  Temp: 98.6 F (37 C)  SpO2: 99%   Vitals:   05/29/24 1401  Weight: 207 lb 9.6 oz (94.2 kg)  Height: 5' 9 (1.753 m)   Body mass index is 30.66 kg/m. Ideal Body Weight: Weight in (lb) to have BMI = 25: 168.9  GEN: no acute distress.  Overweight, looks well HEENT: Atraumatic, Normocephalic.  Bilateral TM wnl, oropharynx normal.  PEERL,EOMI.   Ears and Nose: No external deformity. CV: RRR, No M/G/R. No JVD. No thrill. No extra heart sounds. PULM: CTA B, no wheezes, crackles, rhonchi. No retractions. No resp. distress. No accessory muscle use. ABD: S, NT, ND, +BS. No rebound. No HSM. EXTR: No c/c/e PSYCH: Normally interactive. Conversant.   Results for orders placed or performed in visit on 05/29/24  POC COVID-19 BinaxNow   Collection Time: 05/29/24  2:48 PM  Result Value Ref Range   SARS Coronavirus 2 Ag Negative Negative    Assessment and Plan: Malaise - Plan: POC COVID-19 BinaxNow  Chronic frontal sinusitis - Plan: doxycycline  (VIBRAMYCIN ) 100 MG capsule, predniSONE  (DELTASONE ) 20 MG tablet  Viral URI - Plan: fluticasone  (FLONASE ) 50 MCG/ACT nasal spray  Seasonal allergies - Plan: levocetirizine (XYZAL ) 5 MG tablet Patient seen today with concern of recurrent sinusitis symptoms.  She tested negative for COVID-19 today.  Will treat her with doxycycline  and prednisone .  Also encouraged her to start back on her nasal steroid spray and nonsedating  antihistamine She will let me know if not feeling better in the next few days  Signed Harlene Schroeder, MD

## 2024-06-26 ENCOUNTER — Encounter: Payer: Self-pay | Admitting: Family Medicine

## 2024-06-26 DIAGNOSIS — J329 Chronic sinusitis, unspecified: Secondary | ICD-10-CM

## 2024-06-30 ENCOUNTER — Other Ambulatory Visit: Payer: Self-pay | Admitting: Family Medicine

## 2024-06-30 DIAGNOSIS — J321 Chronic frontal sinusitis: Secondary | ICD-10-CM

## 2024-06-30 MED ORDER — PREDNISONE 20 MG PO TABS: ORAL_TABLET | ORAL | 0 refills | Status: DC

## 2024-06-30 MED ORDER — DOXYCYCLINE HYCLATE 100 MG PO CAPS: 100.0000 mg | ORAL_CAPSULE | Freq: Two times a day (BID) | ORAL | 0 refills | Status: DC

## 2024-06-30 NOTE — Telephone Encounter (Signed)

## 2024-06-30 NOTE — Addendum Note (Signed)
 Addended by: WATT RAISIN C on: 06/30/2024 12:19 PM   Modules accepted: Orders

## 2024-07-02 ENCOUNTER — Ambulatory Visit (HOSPITAL_BASED_OUTPATIENT_CLINIC_OR_DEPARTMENT_OTHER)

## 2024-07-03 ENCOUNTER — Ambulatory Visit (HOSPITAL_BASED_OUTPATIENT_CLINIC_OR_DEPARTMENT_OTHER)
Admission: RE | Admit: 2024-07-03 | Discharge: 2024-07-03 | Disposition: A | Discharge status: Home / Self Care | Source: Ambulatory Visit | Attending: Family Medicine | Admitting: Family Medicine

## 2024-07-03 ENCOUNTER — Ambulatory Visit (HOSPITAL_BASED_OUTPATIENT_CLINIC_OR_DEPARTMENT_OTHER)

## 2024-07-03 DIAGNOSIS — J329 Chronic sinusitis, unspecified: Secondary | ICD-10-CM | POA: Diagnosis present

## 2024-07-06 ENCOUNTER — Encounter: Payer: Self-pay | Admitting: Family Medicine

## 2024-09-23 NOTE — Progress Notes (Deleted)
 [facility] at [facility] [address], [address] [place], [place] [address] [phone]  Date:  09/25/2024   Name:  Joanne Kim   DOB:  [birth year]   MRN:  [id number]  PCP:  Watt Harlene BROCKS, MD    Chief Complaint: No chief complaint on file.   History of Present Illness:  Joanne Kim is a 63 y.o. very pleasant female patient who presents with the following:  Patient seen today for follow-up-I saw her most recently in August She was previously on Zepbound  but stopped using it in May of this year and was working on maintaining her weight on her own History of sleep apnea, obesity  Flu shot Can offer pneumonia vaccination Shingrix  Discussed the use of AI scribe software for clinical note transcription with the patient, who gave verbal consent to proceed.  History of Present Illness      Patient Active Problem List   Diagnosis Date Noted   Moderate obstructive sleep apnea-hypopnea syndrome 10/26/2021   Intermittent sleepiness 10/26/2021   Non-restorative sleep 10/26/2021   Aphakic open-angle glaucoma, 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   Dry eyes 10/23/2016   Obesity 10/23/2016   RECTAL BLEEDING 12/14/2009    Past Medical History:  Diagnosis Date   Allergy    Elevated blood pressure reading    History of colon polyps    Vaginal Pap smear, abnormal 2017    Past Surgical History:  Procedure Laterality Date   BREAST BIOPSY     BUNIONECTOMY     COLONOSCOPY  2011    Social History[1]  Family History  Problem Relation Age of Onset   Heart disease Other    Stroke Other    Hypertension Other    Breast cancer Maternal Aunt    Sarcoidosis Sister    Allergic rhinitis Sister    Asthma Son    Allergic rhinitis Son    Urticaria Son    Colon polyps Mother    Allergic rhinitis Grandchild    Eczema Grandchild    Urticaria Niece    Colon cancer Neg Hx    Esophageal cancer Neg Hx    Rectal cancer Neg Hx     Stomach cancer Neg Hx    Angioedema Neg Hx    Immunodeficiency Neg Hx     Allergies[2]  Medication list has been reviewed and updated.  Medications Ordered Prior to Encounter[3]  Review of Systems:  As per HPI- otherwise negative.   Physical Examination: There were no vitals filed for this visit. There were no vitals filed for this visit. There is no height or weight on file to calculate BMI. Ideal Body Weight:    GEN: no acute distress. HEENT: Atraumatic, Normocephalic.  Ears and Nose: No external deformity. CV: RRR, No M/G/R. No JVD. No thrill. No extra heart sounds. PULM: CTA B, no wheezes, crackles, rhonchi. No retractions. No resp. distress. No accessory muscle use. ABD: S, NT, ND, +BS. No rebound. No HSM. EXTR: No c/c/e PSYCH: Normally interactive. Conversant.    Assessment and Plan: No diagnosis found.  Assessment & Plan   Signed Harlene Watt, MD    [1]  Social History Tobacco Use   Smoking status: Never   Smokeless tobacco: Never  Substance Use Topics   Alcohol use: Yes    Comment: occasionally   Drug use: No  [2]  Allergies Allergen Reactions   Augmentin  [Amoxicillin -Pot Clavulanate]     rash   Oxycodone Itching  [3]  Current Outpatient Medications on File Prior to Visit  Medication Sig Dispense Refill   doxycycline  (VIBRAMYCIN ) 100 MG capsule Take 1 capsule (100 mg total) by mouth 2 (two) times daily. 20 capsule 0   fluticasone  (FLONASE ) 50 MCG/ACT nasal spray Place 2 sprays into both nostrils daily. 48 g 3   ipratropium (ATROVENT ) 0.03 % nasal spray Place 2 sprays into both nostrils every 12 (twelve) hours. 30 mL 12   levocetirizine (XYZAL ) 5 MG tablet Take 1 tablet (5 mg total) by mouth every evening. 90 tablet 3   ondansetron  (ZOFRAN ) 4 MG tablet Take 1 tablet (4 mg total) by mouth every 8 (eight) hours as needed for nausea or vomiting. (Patient not taking: Reported on 05/29/2024) 30 tablet 0   predniSONE  (DELTASONE ) 20 MG tablet Take 40  mg by mouth daily for 5 days 10 tablet 0   Progesterone Micronized 10 % CREA Place 4 Pump onto the skin at bedtime. (Patient not taking: Reported on 11/06/2022)     Testosterone Compounding Kit 20 % CREA Apply topically. (Patient not taking: Reported on 11/06/2022)     tirzepatide  (ZEPBOUND ) 7.5 MG/0.5ML Pen Inject 7.5 mg into the skin once a week. (Patient not taking: Reported on 05/29/2024) 6 mL 1   No current facility-administered medications on file prior to visit.

## 2024-09-25 ENCOUNTER — Ambulatory Visit: Admitting: Family Medicine

## 2024-10-07 ENCOUNTER — Encounter: Payer: Self-pay | Admitting: Family Medicine

## 2024-10-07 MED ORDER — ZEPBOUND 2.5 MG/0.5ML ~~LOC~~ SOAJ: 2.5000 mg | SUBCUTANEOUS | 1 refills | Status: DC

## 2024-10-08 ENCOUNTER — Telehealth: Admitting: Nurse Practitioner

## 2024-10-08 DIAGNOSIS — J069 Acute upper respiratory infection, unspecified: Secondary | ICD-10-CM

## 2024-10-08 MED ORDER — BENZONATATE 100 MG PO CAPS: 100.0000 mg | ORAL_CAPSULE | Freq: Three times a day (TID) | ORAL | 0 refills | Status: AC | PRN

## 2024-10-08 MED ORDER — IPRATROPIUM BROMIDE 0.03 % NA SOLN: 2.0000 | Freq: Two times a day (BID) | NASAL | 12 refills | Status: AC

## 2024-10-08 NOTE — Progress Notes (Signed)
 We are sorry you are not feeling well.  Here is how we plan to help!  Based on what you have shared with me, it looks like you may have a viral upper respiratory infection.  Upper respiratory infections are caused by a large number of viruses; however, rhinovirus is the most common cause.   Your symptoms may have also been from the flu but we do not recommend anti-viral (Tamiflu ) use once symptoms have been present longer than 48 hours. I believe it is still early to consider antibiotics, we typically do note recommend use of antibiotics prior to 7 days of consistent symptoms. Below are other supportive measures we recommend.   Symptoms vary from person to person, with common symptoms including sore throat, cough, and fatigue or lack of energy, and a feeling of general discomfort.  A low-grade fever of up to 100.4 may present, but is often uncommon.  Symptoms vary however, and are closely related to a person's age or underlying illnesses.  The most common symptoms associated with an upper respiratory infection are nasal discharge or congestion, cough, sneezing, headache and pressure in the ears and face.  These symptoms usually persist for about 3 to 10 days, but can last up to 2 weeks.  It is important to know that upper respiratory infections do not cause serious illness or complications in most cases.    Upper respiratory infections can be transmitted from person to person, with the most common method of transmission being a person's hands.  The virus is able to live on the skin and can infect other persons for up to 2 hours after direct contact.  Also, these can be transmitted when someone coughs or sneezes; thus, it is important to cover the mouth to reduce this risk.  To keep the spread of the illness at bay, good hand hygiene is very important!  Because this is a viral infection, there are no specific treatments other than to help you with the symptoms until the infection runs its course.    For  nasal congestion, you may use an oral decongestants such as Mucinex  D or if you have glaucoma or high blood pressure use plain Mucinex .  Saline nasal spray or nasal drops can help and can safely be used as often as needed for congestion.  For your congestion, I have prescribed Ipratropium Bromide  nasal spray 0.03% two sprays in each nostril 2-3 times a day  If you do not have a history of heart disease, hypertension, diabetes or thyroid  disease, prostate/bladder issues or glaucoma, you may also use Sudafed to treat nasal congestion.  It is highly recommended that you consult with a pharmacist or your primary care physician to ensure this medication is safe for you to take.     If you have a cough, you may use over-the-counter cough suppressants such as Delsym and Robitussin.  If you have glaucoma or high blood pressure, you can also use Coricidin HBP.   For cough I have prescribed for you A prescription cough medication called Tessalon  Perles 100 mg. You may take 1-2 capsules every 8 hours as needed for cough  If you have a sore or scratchy throat, use a saltwater gargle-  to  teaspoon of salt dissolved in a 4-ounce to 8-ounce glass of warm water.  Gargle the solution for approximately 15-30 seconds and then spit.  It is important not to swallow the solution.  You can also use throat lozenges/cough drops and Chloraseptic spray to help with throat  pain or discomfort.  Warm or cold liquids can also be helpful in relieving throat pain.   For headache, pain, or general discomfort, you can use Ibuprofen or Tylenol  as directed.   Some authorities believe that zinc sprays or the use of Echinacea may shorten the course of your symptoms.   HOME CARE Only take medications as instructed by your medical team. Be sure to drink plenty of fluids. Water is fine as well as fruit juices, sodas and electrolyte beverages. You may want to stay away from caffeine or alcohol. If you are nauseated, try taking small sips of  liquids. How do you know if you are getting enough fluid? Your urine should be a pale yellow or almost colorless. Get rest. Taking a steamy shower or using a humidifier may help nasal congestion and ease sore throat pain. You can place a towel over your head and breathe in the steam from hot water coming from a faucet. Using a saline nasal spray works much the same way. Cough drops, hard candies and sore throat lozenges may ease your cough. Avoid close contacts especially the very young and the elderly Cover your mouth if you cough or sneeze Always remember to wash your hands.   GET HELP RIGHT AWAY IF: You develop worsening fever. If your symptoms do not improve within 10 days You develop yellow or green discharge from your nose over 3 days. You have coughing fits You develop a severe head ache or visual changes. You develop shortness of breath, difficulty breathing or start having chest pain Your symptoms persist after you have completed your treatment plan  MAKE SURE YOU  Understand these instructions. Will watch your condition. Will get help right away if you are not doing well or get worse.  Your e-visit answers were reviewed by a board certified advanced clinical practitioner to complete your personal care plan. Depending upon the condition, your plan could have included both over-the-counter or prescription medications.  Please review your pharmacy choice. If there is a problem, you may call our nursing hot line at and have the prescription routed to another pharmacy. Your safety is important to us . If you have drug allergies, check your prescription carefully.   You can use MyChart to ask questions about todays visit, request a non-urgent call back, or ask for a work or school excuse for 24 hours related to this e-Visit. If it has been greater than 24 hours you will need to follow up with your provider, or enter a new e-Visit to address those concerns. You will get an e-mail in  the next two days asking about your experience.  I hope that your e-visit has been valuable and will speed your recovery. Thank you for using e-visits.   I have spent 5 minutes in review of e-visit questionnaire, review and updating patient chart, medical decision making and response to patient.   Lauraine Kitty, FNP

## 2024-10-14 ENCOUNTER — Other Ambulatory Visit (HOSPITAL_COMMUNITY): Payer: Self-pay

## 2024-10-14 ENCOUNTER — Telehealth: Payer: Self-pay

## 2024-10-14 NOTE — Telephone Encounter (Signed)
 Pharmacy Patient Advocate Encounter   Received notification from Patient Advice Request messages that prior authorization for Zepbound  2.5mg /0.24ml is required/requested.   Insurance verification completed.   The patient is insured through [facility].   Per test claim: PA required; PA submitted to above mentioned insurance via Latent Key/confirmation #/EOC [id number] Status is pending

## 2024-10-15 ENCOUNTER — Encounter: Payer: Self-pay | Admitting: Family Medicine

## 2024-10-15 NOTE — Telephone Encounter (Signed)
 Pharmacy Patient Advocate Encounter  Received notification from [employer] that Prior Authorization for Zepbound  2.5mg /0.34ml has been DENIED.  See denial reason below. No denial letter attached in CMM. Will attach denial letter to Media tab once received.   PA #/Case ID/Reference #: [id number]

## 2024-10-21 NOTE — Patient Instructions (Incomplete)
 It was good to see you today!

## 2024-10-21 NOTE — Progress Notes (Unsigned)
 [facility] [address] [place], [place] [address] [phone]  Date:  10/22/2024   Name:  Joanne Kim   DOB:  Nov 30, 1960   MRN:  [id number]  PCP:  Watt Harlene BROCKS, MD    Chief Complaint: No chief complaint on file.   History of Present Illness:  Joanne Kim is a 64 y.o. very pleasant female patient who presents with the following:  Patient seen today for physical exam.  I saw her most recently in August for acute illness History of sleep apnea, glaucoma, obesity She previously took Zepbound  but was no longer using it at our last visit, she was working on maintaining her weight on her own.  She did end up requesting to go back on Zepbound  earlier this month but it appears her insurance would not approve the request this time  Pneumonia vaccine needed Recommend Shingrix Recommend flu shot Mammogram is up-to-date Pap smear is up-to-date Colon cancer screening completed 2022  Labs completed in May Discussed the use of AI scribe software for clinical note transcription with the patient, who gave verbal consent to proceed.  History of Present Illness     Patient Active Problem List   Diagnosis Date Noted   Moderate obstructive sleep apnea-hypopnea syndrome 10/26/2021   Intermittent sleepiness 10/26/2021   Non-restorative sleep 10/26/2021   Aphakic open-angle glaucoma, 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   Dry eyes 10/23/2016   Obesity 10/23/2016   RECTAL BLEEDING 12/14/2009    Past Medical History:  Diagnosis Date   Allergy    Elevated blood pressure reading    History of colon polyps    Vaginal Pap smear, abnormal 2017    Past Surgical History:  Procedure Laterality Date   BREAST BIOPSY     BUNIONECTOMY     COLONOSCOPY  2011    Social History[1]  Family History  Problem Relation Age of Onset   Heart disease Other    Stroke Other    Hypertension Other    Breast cancer Maternal Aunt     Sarcoidosis Sister    Allergic rhinitis Sister    Asthma Son    Allergic rhinitis Son    Urticaria Son    Colon polyps Mother    Allergic rhinitis Grandchild    Eczema Grandchild    Urticaria Niece    Colon cancer Neg Hx    Esophageal cancer Neg Hx    Rectal cancer Neg Hx    Stomach cancer Neg Hx    Angioedema Neg Hx    Immunodeficiency Neg Hx     Allergies[2]  Medication list has been reviewed and updated.  Medications Ordered Prior to Encounter[3]  Review of Systems:  As per HPI- otherwise negative.  Physical Examination: There were no vitals filed for this visit. There were no vitals filed for this visit. There is no height or weight on file to calculate BMI. Ideal Body Weight:    GEN: no acute distress. HEENT: Atraumatic, Normocephalic.  Ears and Nose: No external deformity. CV: RRR, No M/G/R. No JVD. No thrill. No extra heart sounds. PULM: CTA B, no wheezes, crackles, rhonchi. No retractions. No resp. distress. No accessory muscle use. ABD: S, NT, ND, +BS. No rebound. No HSM. EXTR: No c/c/e PSYCH: Normally interactive. Conversant.    Assessment and Plan: No diagnosis found.  Assessment & Plan   Signed Harlene Watt, MD    [1]  Social History Tobacco Use  Smoking status: Never   Smokeless tobacco: Never  Substance Use Topics   Alcohol use: Yes    Comment: occasionally   Drug use: No  [2]  Allergies Allergen Reactions   Augmentin  [Amoxicillin -Pot Clavulanate]     rash   Oxycodone Itching  [3]  Current Outpatient Medications on File Prior to Visit  Medication Sig Dispense Refill   benzonatate  (TESSALON ) 100 MG capsule Take 1 capsule (100 mg total) by mouth 3 (three) times daily as needed. 30 capsule 0   doxycycline  (VIBRAMYCIN ) 100 MG capsule Take 1 capsule (100 mg total) by mouth 2 (two) times daily. 20 capsule 0   fluticasone  (FLONASE ) 50 MCG/ACT nasal spray Place 2 sprays into both nostrils daily. 48 g 3   ipratropium (ATROVENT )  0.03 % nasal spray Place 2 sprays into both nostrils every 12 (twelve) hours. 30 mL 12   ipratropium (ATROVENT ) 0.03 % nasal spray Place 2 sprays into both nostrils every 12 (twelve) hours. 30 mL 12   levocetirizine (XYZAL ) 5 MG tablet Take 1 tablet (5 mg total) by mouth every evening. 90 tablet 3   ondansetron  (ZOFRAN ) 4 MG tablet Take 1 tablet (4 mg total) by mouth every 8 (eight) hours as needed for nausea or vomiting. (Patient not taking: Reported on 05/29/2024) 30 tablet 0   predniSONE  (DELTASONE ) 20 MG tablet Take 40 mg by mouth daily for 5 days 10 tablet 0   Progesterone Micronized 10 % CREA Place 4 Pump onto the skin at bedtime. (Patient not taking: Reported on 11/06/2022)     Testosterone Compounding Kit 20 % CREA Apply topically. (Patient not taking: Reported on 11/06/2022)     tirzepatide  (ZEPBOUND ) 2.5 MG/0.5ML Pen Inject 2.5 mg into the skin once a week. 2 mL 1   No current facility-administered medications on file prior to visit.   "

## 2024-10-22 ENCOUNTER — Encounter: Admitting: Family Medicine

## 2024-10-22 DIAGNOSIS — Z0000 Encounter for general adult medical examination without abnormal findings: Secondary | ICD-10-CM

## 2024-10-26 NOTE — Patient Instructions (Signed)
 It was good to see you today, I will be in touch with your labs We will try again for the zepbound  Let me know what happens with this rx

## 2024-10-26 NOTE — Progress Notes (Addendum)
 [facility], [employer] [facility] [employer] [address], [address] [place], [place] [address] [phone]  Date:  10/30/2024   Name:  BETUL BRISKY   DOB:  Oct 22, 1960   MRN:  [id number]  PCP:  Watt Harlene BROCKS, MD    Chief Complaint: Annual Exam (URI turned into sinus infection )   History of Present Illness:  Joanne Kim is a 64 y.o. very pleasant female patient who presents with the following:  Patient seen today for physical exam.  I saw her most recently in August for acute illness History of sleep apnea, glaucoma, obesity, prediabetes She previously took Zepbound  but was no longer using it at our last visit, she was working on maintaining her weight on her own.  She did end up requesting to go back on Zepbound  earlier this month but it appears her insurance would not approve the request this time  Pneumonia vaccine needed-declines today Recommend Shingrix-declines today Recommend flu shot- declines today  Mammogram is up-to-date Pap smear is up-to-date Colon cancer screening completed 2022  Labs completed in May Discussed the use of AI scribe software for clinical note transcription with the patient, who gave verbal consent to proceed.  History of Present Illness Joanne Kim is a 64 year old female who presents for follow-up regarding weight management and sleep apnea concerns.  She had an upper respiratory illness since New Year's, for which she was treated with doxycycline  and prednisone , leading to symptom resolution. She recently completed these medications and she is feeling better.  She is concerned about her weight management now that she is not receiving Zepbound -she has gained back some of what she lost. She participates in a lifestyle program called [facility], which includes meal planning, mental health strategies, and fitness activities. She is a member of [employer] and exercises about three times a week,  although she has not been able to attend in the last three weeks due to illness. She plans to get back to the gym asap and is using a health coaching benefit from her work called [employer].  She previously used Zepbound  at a dose of 7.5 mg for weight management, but is currently not on this medication due to insurance issues.  It looks like they may require more information about lifestyle changes to continue covering this medication.  She also has sleep apnea which is another indication for Zepbound -see details below  She has a history of sleep apnea and previously underwent a sleep study a couple of years ago. The initial test was inconclusive due to technical issues, and the subsequent test was uncomfortable, leading to no follow-up treatment. She experiences fatigue, which she believes may be related to her sleep apnea, and notes feeling extremely fatigued when her weight exceeds 185-190 pounds.  This seems to be the cutoff when her sleep apnea becomes more symptomatic  She does not take much medication regularly, only using Xyzal  as needed. No interest in receiving a flu shot or pneumonia vaccine due to past experiences of getting sick after vaccinations.  Patient Active Problem List   Diagnosis Date Noted   Moderate obstructive sleep apnea-hypopnea syndrome 10/26/2021   Intermittent sleepiness 10/26/2021   Non-restorative sleep 10/26/2021   Aphakic open-angle glaucoma, 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   Dry eyes 10/23/2016   Obesity 10/23/2016   RECTAL BLEEDING 12/14/2009    Past Medical History:  Diagnosis Date   Allergy    Elevated blood pressure reading  History of colon polyps    Vaginal Pap smear, abnormal 2017    Past Surgical History:  Procedure Laterality Date   BREAST BIOPSY     BUNIONECTOMY     COLONOSCOPY  2011    Social History[1]  Family History  Problem Relation Age of Onset   Heart disease Other    Stroke Other    Hypertension Other    Breast cancer Maternal Aunt     Sarcoidosis Sister    Allergic rhinitis Sister    Asthma Son    Allergic rhinitis Son    Urticaria Son    Colon polyps Mother    Allergic rhinitis Grandchild    Eczema Grandchild    Urticaria Niece    Colon cancer Neg Hx    Esophageal cancer Neg Hx    Rectal cancer Neg Hx    Stomach cancer Neg Hx    Angioedema Neg Hx    Immunodeficiency Neg Hx     Allergies[2]  Medication list has been reviewed and updated.  Medications Ordered Prior to Encounter[3]  Review of Systems:  As per HPI- otherwise negative.  Physical Examination: Vitals:   10/30/24 1424  BP: 112/68  Pulse: 81  Temp: 98.8 F (37.1 C)  SpO2: 99%   Vitals:   10/30/24 1424  Weight: 223 lb (101.2 kg)  Height: 5' 9 (1.753 m)   Body mass index is 32.93 kg/m. Ideal Body Weight: Weight in (lb) to have BMI = 25: 168.9  GEN: no acute distress.  Mildly obese, looks well HEENT: Atraumatic, Normocephalic. Bilateral TM wnl, oropharynx normal.  PEERL,EOMI.   Ears and Nose: No external deformity. CV: RRR, No M/G/R. No JVD. No thrill. No extra heart sounds. PULM: CTA B, no wheezes, crackles, rhonchi. No retractions. No resp. distress. No accessory muscle use. ABD: S, NT, ND, +BS. No rebound. No HSM. EXTR: No c/c/e PSYCH: Normally interactive. Conversant.    Assessment and Plan: Physical exam  Screening for diabetes mellitus - Plan: Comprehensive metabolic panel with GFR, Hemoglobin A1c  Screening for deficiency anemia - Plan: CBC  Vitamin D  deficiency - Plan: VITAMIN D  25 Hydroxy (Vit-D Deficiency, Fractures)  Screening, lipid - Plan: Lipid panel  Moderate obstructive sleep apnea-hypopnea syndrome  Thyroid  disorder screening - Plan: TSH  Seasonal allergies - Plan: levocetirizine (XYZAL ) 5 MG tablet  Morbid obesity (HCC)  Assessment & Plan Obstructive sleep apnea Moderate obstructive sleep apnea confirmed. Fatigue and non-restorative sleep reported. Previous CPAP therapy not pursued due to  discomfort. - Will try again with Zepbound  we have additional information  Weight management Engaged in lifestyle modifications with [facility] program and exercise. Current weight above ideal range causing fatigue and discomfort. - Submitted documentation for Zepbound  approval. - Continue [facility] program and exercise.  Seasonal allergic rhinitis Managed with Xyzal . - Refilled Xyzal  prescription.  General health maintenance Routine health maintenance up to date. Declined flu and pneumonia vaccines. - Ordered blood work. - Continue routine health maintenance.  Signed Harlene Schroeder, MD Addendum 1/23, received labs as below.  Message to patient  Results for orders placed or performed in visit on 10/30/24  CBC   Collection Time: 10/30/24  2:58 PM  Result Value Ref Range   WBC 6.7 4.0 - 10.5 K/uL   RBC 4.78 3.87 - 5.11 Mil/uL   Platelets 178.0 150.0 - 400.0 K/uL   Hemoglobin 14.1 12.0 - 15.0 g/dL   HCT 57.4 63.9 - 53.9 %   MCV 88.9 78.0 - 100.0 fl   MCHC  33.3 30.0 - 36.0 g/dL   RDW 84.6 88.4 - 84.4 %  Comprehensive metabolic panel with GFR   Collection Time: 10/30/24  2:58 PM  Result Value Ref Range   Sodium 141 135 - 145 mEq/L   Potassium 3.7 3.5 - 5.1 mEq/L   Chloride 103 96 - 112 mEq/L   CO2 31 19 - 32 mEq/L   Glucose, Bld 84 70 - 99 mg/dL   BUN 12 6 - 23 mg/dL   Creatinine, Ser 9.18 0.40 - 1.20 mg/dL   Total Bilirubin 0.8 0.2 - 1.2 mg/dL   Alkaline Phosphatase 85 39 - 117 U/L   AST 21 5 - 37 U/L   ALT 16 3 - 35 U/L   Total Protein 6.6 6.0 - 8.3 g/dL   Albumin 3.9 3.5 - 5.2 g/dL   GFR 21.71 >39.99 mL/min   Calcium 9.6 8.4 - 10.5 mg/dL  Hemoglobin J8r   Collection Time: 10/30/24  2:58 PM  Result Value Ref Range   Hgb A1c MFr Bld 5.8 4.6 - 6.5 %  Lipid panel   Collection Time: 10/30/24  2:58 PM  Result Value Ref Range   Cholesterol 194 28 - 200 mg/dL   Triglycerides 821.9 (H) 10.0 - 149.0 mg/dL   HDL 39.69 >60.99 mg/dL   VLDL 64.3 0.0 - 59.9 mg/dL   LDL  Cholesterol 98 10 - 99 mg/dL   Total CHOL/HDL Ratio 3    NonHDL 133.27   TSH   Collection Time: 10/30/24  2:58 PM  Result Value Ref Range   TSH 0.91 0.35 - 5.50 uIU/mL  VITAMIN D  25 Hydroxy (Vit-D Deficiency, Fractures)   Collection Time: 10/30/24  2:58 PM  Result Value Ref Range   VITD 30.55 30.00 - 100.00 ng/mL       [1]  Social History Tobacco Use   Smoking status: Never   Smokeless tobacco: Never  Substance Use Topics   Alcohol use: Yes    Comment: occasionally   Drug use: No  [2]  Allergies Allergen Reactions   Augmentin  [Amoxicillin -Pot Clavulanate]     rash   Oxycodone Itching  [3]  Current Outpatient Medications on File Prior to Visit  Medication Sig Dispense Refill   benzonatate  (TESSALON ) 100 MG capsule Take 1 capsule (100 mg total) by mouth 3 (three) times daily as needed. 30 capsule 0   fluticasone  (FLONASE ) 50 MCG/ACT nasal spray Place 2 sprays into both nostrils daily. 48 g 3   ipratropium (ATROVENT ) 0.03 % nasal spray Place 2 sprays into both nostrils every 12 (twelve) hours. 30 mL 12   ipratropium (ATROVENT ) 0.03 % nasal spray Place 2 sprays into both nostrils every 12 (twelve) hours. 30 mL 12   ondansetron  (ZOFRAN ) 4 MG tablet Take 1 tablet (4 mg total) by mouth every 8 (eight) hours as needed for nausea or vomiting. 30 tablet 0   tirzepatide  (ZEPBOUND ) 2.5 MG/0.5ML Pen Inject 2.5 mg into the skin once a week. 2 mL 1   No current facility-administered medications on file prior to visit.   "

## 2024-10-30 ENCOUNTER — Ambulatory Visit (INDEPENDENT_AMBULATORY_CARE_PROVIDER_SITE_OTHER): Admitting: Family Medicine

## 2024-10-30 ENCOUNTER — Encounter: Payer: Self-pay | Admitting: Family Medicine

## 2024-10-30 VITALS — BP 112/68 | HR 81 | Temp 98.8°F | Ht 69.0 in | Wt 223.0 lb

## 2024-10-30 DIAGNOSIS — Z131 Encounter for screening for diabetes mellitus: Secondary | ICD-10-CM | POA: Diagnosis not present

## 2024-10-30 DIAGNOSIS — G4733 Obstructive sleep apnea (adult) (pediatric): Secondary | ICD-10-CM | POA: Diagnosis not present

## 2024-10-30 DIAGNOSIS — E559 Vitamin D deficiency, unspecified: Secondary | ICD-10-CM

## 2024-10-30 DIAGNOSIS — Z0000 Encounter for general adult medical examination without abnormal findings: Secondary | ICD-10-CM

## 2024-10-30 DIAGNOSIS — Z13220 Encounter for screening for lipoid disorders: Secondary | ICD-10-CM

## 2024-10-30 DIAGNOSIS — Z130 Encounter for screening for diseases of the blood and blood-forming organs and certain disorders involving the immune mechanism: Secondary | ICD-10-CM | POA: Diagnosis not present

## 2024-10-30 DIAGNOSIS — J302 Other seasonal allergic rhinitis: Secondary | ICD-10-CM

## 2024-10-30 DIAGNOSIS — Z1329 Encounter for screening for other suspected endocrine disorder: Secondary | ICD-10-CM

## 2024-10-30 MED ORDER — ZEPBOUND 2.5 MG/0.5ML ~~LOC~~ SOAJ: 2.5000 mg | SUBCUTANEOUS | 2 refills | Status: AC

## 2024-10-30 MED ORDER — LEVOCETIRIZINE DIHYDROCHLORIDE 5 MG PO TABS: 5.0000 mg | ORAL_TABLET | Freq: Every evening | ORAL | 3 refills | Status: AC

## 2024-10-31 ENCOUNTER — Encounter: Payer: Self-pay | Admitting: Family Medicine

## 2024-10-31 LAB — CBC
HCT: 42.5 % (ref 36.0–46.0)
Hemoglobin: 14.1 g/dL (ref 12.0–15.0)
MCHC: 33.3 g/dL (ref 30.0–36.0)
MCV: 88.9 fl (ref 78.0–100.0)
Platelets: 178 K/uL (ref 150.0–400.0)
RBC: 4.78 Mil/uL (ref 3.87–5.11)
RDW: 15.3 % (ref 11.5–15.5)
WBC: 6.7 K/uL (ref 4.0–10.5)

## 2024-10-31 LAB — COMPREHENSIVE METABOLIC PANEL WITH GFR
ALT: 16 U/L (ref 3–35)
AST: 21 U/L (ref 5–37)
Albumin: 3.9 g/dL (ref 3.5–5.2)
Alkaline Phosphatase: 85 U/L (ref 39–117)
BUN: 12 mg/dL (ref 6–23)
CO2: 31 meq/L (ref 19–32)
Calcium: 9.6 mg/dL (ref 8.4–10.5)
Chloride: 103 meq/L (ref 96–112)
Creatinine, Ser: 0.81 mg/dL (ref 0.40–1.20)
GFR: 78.28 mL/min
Glucose, Bld: 84 mg/dL (ref 70–99)
Potassium: 3.7 meq/L (ref 3.5–5.1)
Sodium: 141 meq/L (ref 135–145)
Total Bilirubin: 0.8 mg/dL (ref 0.2–1.2)
Total Protein: 6.6 g/dL (ref 6.0–8.3)

## 2024-10-31 LAB — LIPID PANEL
Cholesterol: 194 mg/dL (ref 28–200)
HDL: 60.3 mg/dL
LDL Cholesterol: 98 mg/dL (ref 10–99)
NonHDL: 133.27
Total CHOL/HDL Ratio: 3
Triglycerides: 178 mg/dL — ABNORMAL HIGH (ref 10.0–149.0)
VLDL: 35.6 mg/dL (ref 0.0–40.0)

## 2024-10-31 LAB — TSH: TSH: 0.91 u[IU]/mL (ref 0.35–5.50)

## 2024-10-31 LAB — HEMOGLOBIN A1C: Hgb A1c MFr Bld: 5.8 % (ref 4.6–6.5)

## 2024-10-31 LAB — VITAMIN D 25 HYDROXY (VIT D DEFICIENCY, FRACTURES): VITD: 30.55 ng/mL (ref 30.00–100.00)

## 2024-11-11 ENCOUNTER — Other Ambulatory Visit (HOSPITAL_COMMUNITY): Payer: Self-pay

## 2024-11-17 ENCOUNTER — Ambulatory Visit: Admitting: Family Medicine
